# Patient Record
Sex: Female | Born: 1970 | Race: White | Hispanic: No | Marital: Single | Smoking: Former smoker
Health system: Southern US, Community
[De-identification: ages and names within clinical notes are randomized; demographics above are authoritative.]

## PROBLEM LIST (undated history)

## (undated) DIAGNOSIS — E119 Type 2 diabetes mellitus without complications: Secondary | ICD-10-CM

## (undated) DIAGNOSIS — T7840XA Allergy, unspecified, initial encounter: Secondary | ICD-10-CM

## (undated) DIAGNOSIS — E118 Type 2 diabetes mellitus with unspecified complications: Secondary | ICD-10-CM

## (undated) DIAGNOSIS — E785 Hyperlipidemia, unspecified: Secondary | ICD-10-CM

## (undated) DIAGNOSIS — F17200 Nicotine dependence, unspecified, uncomplicated: Secondary | ICD-10-CM

## (undated) DIAGNOSIS — F329 Major depressive disorder, single episode, unspecified: Secondary | ICD-10-CM

## (undated) DIAGNOSIS — F32A Depression, unspecified: Secondary | ICD-10-CM

## (undated) HISTORY — DX: Allergy, unspecified, initial encounter: T78.40XA

## (undated) HISTORY — PX: TONSILLECTOMY AND ADENOIDECTOMY: SUR1326

## (undated) HISTORY — PX: EYE SURGERY: SHX253

## (undated) HISTORY — DX: Major depressive disorder, single episode, unspecified: F32.9

## (undated) HISTORY — DX: Hyperlipidemia, unspecified: E78.5

## (undated) HISTORY — DX: Nicotine dependence, unspecified, uncomplicated: F17.200

## (undated) HISTORY — DX: Type 2 diabetes mellitus without complications: E11.9

## (undated) HISTORY — DX: Depression, unspecified: F32.A

## (undated) HISTORY — DX: Type 2 diabetes mellitus with unspecified complications: E11.8

---

## 1997-08-13 ENCOUNTER — Inpatient Hospital Stay (HOSPITAL_COMMUNITY): Admission: AD | Admit: 1997-08-13 | Discharge: 1997-08-13 | Payer: Self-pay | Admitting: Obstetrics

## 1997-09-04 ENCOUNTER — Encounter: Admission: RE | Admit: 1997-09-04 | Discharge: 1997-09-04 | Payer: Self-pay | Admitting: Obstetrics

## 2003-08-06 ENCOUNTER — Other Ambulatory Visit: Admission: RE | Admit: 2003-08-06 | Discharge: 2003-08-06 | Payer: Self-pay | Admitting: Obstetrics and Gynecology

## 2009-09-29 LAB — HM PAP SMEAR: HM PAP: NORMAL

## 2012-04-19 ENCOUNTER — Ambulatory Visit: Admit: 2012-04-19 | Payer: Self-pay | Admitting: Orthopedic Surgery

## 2012-04-19 SURGERY — REMOVAL, HARDWARE
Anesthesia: General | Site: Hip | Laterality: Left

## 2012-11-22 ENCOUNTER — Ambulatory Visit (INDEPENDENT_AMBULATORY_CARE_PROVIDER_SITE_OTHER): Payer: BC Managed Care – PPO | Admitting: Physician Assistant

## 2012-11-22 ENCOUNTER — Telehealth: Payer: Self-pay | Admitting: Physician Assistant

## 2012-11-22 ENCOUNTER — Encounter: Payer: Self-pay | Admitting: Physician Assistant

## 2012-11-22 VITALS — BP 124/84 | HR 88 | Temp 98.7°F | Resp 18 | Ht 66.0 in | Wt 200.0 lb

## 2012-11-22 DIAGNOSIS — F17200 Nicotine dependence, unspecified, uncomplicated: Secondary | ICD-10-CM | POA: Insufficient documentation

## 2012-11-22 DIAGNOSIS — E119 Type 2 diabetes mellitus without complications: Secondary | ICD-10-CM

## 2012-11-22 LAB — HEMOGLOBIN A1C, FINGERSTICK: Hgb A1C (fingerstick): 9.9 % — ABNORMAL HIGH

## 2012-11-22 MED ORDER — VARENICLINE TARTRATE 0.5 MG PO TABS: 0.5000 mg | ORAL_TABLET | Freq: Two times a day (BID) | ORAL | Status: DC

## 2012-11-22 MED ORDER — VARENICLINE TARTRATE 1 MG PO TABS: 1.0000 mg | ORAL_TABLET | Freq: Two times a day (BID) | ORAL | Status: DC

## 2012-11-22 MED ORDER — INSULIN GLARGINE 100 UNIT/ML SOLOSTAR PEN: 30.0000 [IU] | PEN_INJECTOR | Freq: Every day | SUBCUTANEOUS | Status: DC

## 2012-11-22 NOTE — Progress Notes (Signed)
Patient ID: Mckenzie Tyler MRN: [id number], DOB: 09/07/1970, 42 y.o. Date of Encounter: @DATE @  Chief Complaint:  Chief Complaint  Patient presents with  . Diabetes    HPI: 42 y.o. year old white female  presents for followup.  Her last office visit at our office was August 2011. She states that she actually has had no medical evaluations at all since that appointment. She saw Dr. Lelon Perla for that one visit in August 2011. Prior to that she had been at our office one other time and saw me for complete physical exam June 2010. At that visit she reported that Dr. Kalman Jewels back with checking all of her labs including cholesterol and was managing her diabetes.  Today I asked her what happened was seeing Dr. Betsy Coder. She says that she got frustrated because she felt like they kept adjusting medications for her blood sugar was never getting control. Therefore she just stopped all her medications and followup around 2010.  Says that she was on insulin 70/30 at 30 units twice a day back then.  Has been using  no diabetic treatment since 2010.  I Asked her what prompted her to come in to restart treatment today. She states that her fianc recently had lower extremity amputation secondary to uncontrolled diabetes.  Also, she reports in the past she used to Chantix and it worked very well for her and she was able to quit smoking. She says that she stayed on it for 2 or 3 years but then went back to smoking about 1-1/2 years ago. Currently smoking a little less than one pack per day. She is requesting a refill on Chantix.    Past Medical History  Diagnosis Date  . Diabetes mellitus without complication   . Smoker      Home Meds: See attached medication section for current medication list. Any medications entered into computer today will not appear on this note's list. The medications listed below were entered prior to today. No current outpatient prescriptions on file prior to visit.    No current facility-administered medications on file prior to visit.    Allergies: No Known Allergies  History   Social History  . Marital Status: Married    Spouse Name: N/A    Number of Children: N/A  . Years of Education: N/A   Occupational History  . Not on file.   Social History Main Topics  . Smoking status: Current Every Day Smoker -- 1.00 packs/day  . Smokeless tobacco: Never Used  . Alcohol Use: No  . Drug Use: No  . Sexual Activity: Yes    Birth Control/ Protection: None   Other Topics Concern  . Not on file   Social History Narrative  . No narrative on file    Family History  Problem Relation Age of Onset  . Diabetes Father   . Cancer Neg Hx   . Heart disease Neg Hx      Review of Systems:  See HPI for pertinent ROS. All other ROS negative.    Physical Exam: Blood pressure 124/84, pulse 88, temperature 98.7 F (37.1 C), temperature source Oral, resp. rate 18, height 5\' 6"  (1.676 m), weight 200 lb (90.719 kg), last menstrual period 11/23/2010., Body mass index is 32.3 kg/(m^2). General: Overweight white female .Appears in no acute distress. Neck: Supple. No thyromegaly. No lymphadenopathy. No carotid bruits. Lungs: Clear bilaterally to auscultation without wheezes, rales, or rhonchi. Breathing is unlabored. Heart: RRR with S1 S2. No murmurs, rubs, or  gallops. Abdomen: Soft, non-tender, non-distended with normoactive bowel sounds. No hepatomegaly. No rebound/guarding. No obvious abdominal masses. Musculoskeletal:  Strength and tone normal for age. Extremities/Skin: Warm and dry. No clubbing or cyanosis. No edema. No rashes or suspicious lesions. Neuro: Alert and oriented X 3. Moves all extremities spontaneously. Gait is normal. CNII-XII grossly in tact. Psych:  Responds to questions appropriately with a normal affect.   A1C==9.9 today at OV   ASSESSMENT AND PLAN:  42 y.o. year old female with  1. Diabetes mellitus without complication Check  CMET to document chronic kidney and liver numbers.  If creatinine is normal we'll start metformin as an insulin sensitizer. If creatinine is elevated we'll use a different medication instead. Will go ahead and start Lantus now. She is going to start at 15 units ( I did not write it down, but I think I told her to start at 15?) and go up by 2 units each day until she gets to goal fasting blood sugar around 80-120. I did Give her  2 samples of Lantus pens. I gave her a blood sugar log sheet for her to document blood sugars. She is to schedule a followup office visit in 2 weeks and bring the blood sugar log with her. SHe is to come fasting to that appointment so we can check lipid panel at that time.  I told her that I was surprised that her A1c is 9.9. I was expecting it to be even higher. He states that she has lost about 20 pounds secondary to diet changes. Says in the past she travels a lot and 8 in restaurants and ate a very poor diet. 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 As well since her fianc had the amputation there being very compliant with diet.  - Insulin Glargine (LANTUS SOLOSTAR) 100 UNIT/ML SOPN; Inject 30 Units into the skin at bedtime.  Dispense: 1 pen; Refill: 2 - COMPLETE METABOLIC PANEL WITH GFR - Hemoglobin A1C, fingerstick  2. Smoker He has been informed of potential adverse effects of Chantix including but not limited to change in mood or behavior including suicidal ideation. She has to use this medication in the past with no adverse effects and with good results in smoking cessation. - varenicline (CHANTIX) 0.5 MG tablet; Take 1 tablet (0.5 mg total) by mouth 2 (two) times daily.  Dispense: 60 tablet; Refill: 0 - varenicline (CHANTIX) 1 MG tablet; Take 1 tablet (1 mg total) by mouth 2 (two) times daily.  Dispense: 60 tablet; Refill: 3  Note that she originally had scheduled today's appointment as a complete physical exam. However because she  reported known diabetes that was not being treated we discussed that we would just treat that today. Once we get her diabetes and lipids managed, we will then schedule a complete physical exam.  Signed, Frazier Richards, [place], [facility] 11/22/2012 1:49 PM

## 2012-11-22 NOTE — Telephone Encounter (Signed)
Pharmacy call : Chantix  Please call back in regards to her RX . Unsure about how to dispense.

## 2012-11-23 LAB — COMPLETE METABOLIC PANEL WITH GFR
Albumin: 4.5 g/dL (ref 3.5–5.2)
Alkaline Phosphatase: 61 U/L (ref 39–117)
BUN: 17 mg/dL (ref 6–23)
CO2: 28 mEq/L (ref 19–32)
Calcium: 10.2 mg/dL (ref 8.4–10.5)
Chloride: 99 mEq/L (ref 96–112)
GFR, Est African American: >89 mL/min
GFR, Est Non African American: >89 mL/min
Glucose, Bld: 306 mg/dL — ABNORMAL HIGH (ref 70–99)
Sodium: 137 mEq/L (ref 135–145)
Total Bilirubin: 0.8 mg/dL (ref 0.3–1.2)
Total Protein: 7.2 g/dL (ref 6.0–8.3)

## 2012-11-23 NOTE — Telephone Encounter (Signed)
Pharmacy called and Rx clarified per direction seen in office visit

## 2012-12-06 ENCOUNTER — Encounter: Payer: Self-pay | Admitting: Physician Assistant

## 2012-12-06 ENCOUNTER — Other Ambulatory Visit: Payer: Self-pay | Admitting: Family Medicine

## 2012-12-06 ENCOUNTER — Ambulatory Visit (INDEPENDENT_AMBULATORY_CARE_PROVIDER_SITE_OTHER): Payer: BC Managed Care – PPO | Admitting: Physician Assistant

## 2012-12-06 VITALS — BP 134/86 | HR 76 | Temp 98.2°F | Resp 18 | Wt 202.0 lb

## 2012-12-06 DIAGNOSIS — E119 Type 2 diabetes mellitus without complications: Secondary | ICD-10-CM

## 2012-12-06 DIAGNOSIS — F17200 Nicotine dependence, unspecified, uncomplicated: Secondary | ICD-10-CM

## 2012-12-06 DIAGNOSIS — E785 Hyperlipidemia, unspecified: Secondary | ICD-10-CM

## 2012-12-06 LAB — COMPLETE METABOLIC PANEL WITH GFR
AST: 16 U/L (ref 0–37)
Alkaline Phosphatase: 46 U/L (ref 39–117)
BUN: 14 mg/dL (ref 6–23)
Calcium: 9.1 mg/dL (ref 8.4–10.5)
Creat: 0.66 mg/dL (ref 0.50–1.10)
GFR, Est African American: >89 mL/min
GFR, Est Non African American: >89 mL/min
Glucose, Bld: 258 mg/dL — ABNORMAL HIGH (ref 70–99)
Potassium: 4.7 mEq/L (ref 3.5–5.3)
Total Bilirubin: 0.6 mg/dL (ref 0.3–1.2)

## 2012-12-06 LAB — LIPID PANEL
Cholesterol: 187 mg/dL (ref 0–200)
HDL: 32 mg/dL — ABNORMAL LOW (ref >39)
Total CHOL/HDL Ratio: 5.8 Ratio
Triglycerides: 303 mg/dL — ABNORMAL HIGH (ref <150)
VLDL: 61 mg/dL — ABNORMAL HIGH (ref 0–40)

## 2012-12-06 MED ORDER — METFORMIN HCL 500 MG PO TABS: 500.0000 mg | ORAL_TABLET | Freq: Two times a day (BID) | ORAL | Status: DC

## 2012-12-06 MED ORDER — METFORMIN HCL 1000 MG PO TABS: 1000.0000 mg | ORAL_TABLET | Freq: Two times a day (BID) | ORAL | Status: DC

## 2012-12-06 NOTE — Progress Notes (Signed)
Patient ID: Mckenzie Tyler MRN: [id number], DOB: Jun 10, 1970, 42 y.o. Date of Encounter: @DATE @  Chief Complaint:  Chief Complaint  Patient presents with  . 2 week follow up    elevated BS, does not know about labs or new med, had trouble calling back  . has not started metformin    HPI: 42 y.o. year old female  presents for followup.  She recently saw me on 11/22/12.  Prior to that, her last office visit here in this office was August 2011. She states that she actually has had no medical  eval since that appointment. She saw Dr. Lelon Perla for that one visit August 2011. Prior to that she had been at our office one other time by me for complete physical exam June 2010. At that visit she reported to Dr. Anson Fret was checking all of her labs including cholesterol and was managing her diabetes.  She came to the visit with me 11/22/12 I asked her what happened to seen Dr. Anson Fret. She stated that she had gotten frustrated because she felt like she kept adjusting medications for her blood sugar that was never getting controlled. Therefore she just stopped all her medications and stopped all followup around 2010. Says that back then she was on insulin 70/30 at 30 units twice a day. She had been using no diabetic treatment since 2010.  When asked her what prompted her to come in to restart treatment on 11/22/12. She stated that her fianc recently had LE. amputation secondary to uncontrolled diabetes.  At that visit she also reported that in the past she had used Chantix and it worked very well for her and she was able to quit smoking. She stated that she stayed quit for 2-3 years but then went back to smoking for about 11/2 years ago. At that visit she was smoking a little less than one pack per day and was requesting a refill on Chantix.  The visit 11/22/12 we did an A1c while she was here. A1c was 9.9. I went ahead and started her on Lantus at that time. Plan to check labs and a creatinine  normal would add metformin.  Today she states that she playsed phone tag with [place] regarding getting her lab results. She was never told to start metformin. However she did start the Lantus and has been titrating that as directed. She started at 15 units and has gradually worked up to 28 units. However her sugar really has not changed much at all. On the first day of treatment when she was on 15 units her fasting blood sugar was 264. Every single fasting morning blood sugar has ranged from 245-280 every since then. She is currently up to 28 units but still her fasting sugar this morning was reading 277. She also has readings 40 before lunch and before dinner but these are all similar readings in range 250-300.  As well today she says that she is on the Chantix that that is working very well for her. She is down to about 3 cigarettes per day.   Past Medical History  Diagnosis Date  . Diabetes mellitus without complication   . Smoker      Home Meds: See attached medication section for current medication list. Any medications entered into computer today will not appear on this note's list. The medications listed below were entered prior to today. Current Outpatient Prescriptions on File Prior to Visit  Medication Sig Dispense Refill  . Insulin Glargine (LANTUS SOLOSTAR) 100 UNIT/ML  SOPN Inject 30 Units into the skin at bedtime.  1 pen  2  . varenicline (CHANTIX) 0.5 MG tablet Take 1 tablet (0.5 mg total) by mouth 2 (two) times daily.  60 tablet  0  . varenicline (CHANTIX) 1 MG tablet Take 1 tablet (1 mg total) by mouth 2 (two) times daily.  60 tablet  3   No current facility-administered medications on file prior to visit.    Allergies: No Known Allergies  History   Social History  . Marital Status: Married    Spouse Name: N/A    Number of Children: N/A  . Years of Education: N/A   Occupational History  . Not on file.   Social History Main Topics  . Smoking status: Current Every Day  Smoker -- 1.00 packs/day  . Smokeless tobacco: Never Used  . Alcohol Use: No  . Drug Use: No  . Sexual Activity: Yes    Birth Control/ Protection: None   Other Topics Concern  . Not on file   Social History Narrative  . No narrative on file    Family History  Problem Relation Age of Onset  . Diabetes Father   . Cancer Neg Hx   . Heart disease Neg Hx      Review of Systems:  See HPI for pertinent ROS. All other ROS negative.    Physical Exam: Blood pressure 134/86, pulse 76, temperature 98.2 F (36.8 C), temperature source Oral, resp. rate 18, weight 202 lb (91.627 kg), last menstrual period 11/23/2010., Body mass index is 32.62 kg/(m^2). General: Obese white female. Appears in no acute distress.  Neck: Supple. No thyromegaly. No lymphadenopathy. Lungs: Clear bilaterally to auscultation without wheezes, rales, or rhonchi. Breathing is unlabored. Heart: RRR with S1 S2. No murmurs, rubs, or gallops. Abdomen: Soft, non-tender, non-distended with normoactive bowel sounds. No hepatomegaly. No rebound/guarding. No obvious abdominal masses. Musculoskeletal:  Strength and tone normal for age. Extremities/Skin: Warm and dry. No clubbing or cyanosis. No edema. No rashes or suspicious lesions. Neuro: Alert and oriented X 3. Moves all extremities spontaneously. Gait is normal. CNII-XII grossly in tact. Psych:  Responds to questions appropriately with a normal affect.     ASSESSMENT AND PLAN:  42 y.o. year old female with  1. Diabetes mellitus without complication She will add metformin. We'll start at 500 mg dose to see if she has any diarrhea or GI adverse effects. As well then she can monitor her blood sugar make sure that it is not dropping too low. She tolerates this dose then she will increase up to 1000 mg twice a day. Now she will continue her Lantus at the current dose and we'll not continue to increase for at least one week until she gets a metformin added on. Once metformin  was added and she can titrate the Lantus as needed. - metFORMIN (GLUCOPHAGE) 500 MG tablet; Take 1 tablet (500 mg total) by mouth 2 (two) times daily with a meal.  Dispense: 60 tablet; Refill: 0 - metFORMIN (GLUCOPHAGE) 1000 MG tablet; Take 1 tablet (1,000 mg total) by mouth 2 (two) times daily with a meal.  Dispense: 60 tablet; Refill: 3 - COMPLETE METABOLIC PANEL WITH GFR - Lipid panel  2. Smoker Cont. Chantix.  3. ? Lipid Status: Check FLP now. Recheck CMET.  Once we get these medical problems stable and controlled then we will need to schedule a complete physical exam to get all of her medical care  updated. Signed, Shon Hale Lincoln, [place],  [facility] 12/06/2012 9:25 AM

## 2012-12-13 MED ORDER — SIMVASTATIN 10 MG PO TABS: 10.0000 mg | ORAL_TABLET | Freq: Every day | ORAL | Status: DC

## 2012-12-13 NOTE — Addendum Note (Signed)
Addended by: Elvina Mattes T on: 12/13/2012 11:09 AM   Modules accepted: Orders

## 2012-12-31 ENCOUNTER — Ambulatory Visit: Payer: BC Managed Care – PPO | Admitting: Physician Assistant

## 2013-02-13 ENCOUNTER — Encounter: Payer: Self-pay | Admitting: Physician Assistant

## 2013-02-13 ENCOUNTER — Ambulatory Visit (INDEPENDENT_AMBULATORY_CARE_PROVIDER_SITE_OTHER): Payer: BC Managed Care – PPO | Admitting: Physician Assistant

## 2013-02-13 VITALS — BP 122/82 | HR 84 | Temp 97.8°F | Resp 18 | Wt 203.0 lb

## 2013-02-13 DIAGNOSIS — E119 Type 2 diabetes mellitus without complications: Secondary | ICD-10-CM

## 2013-02-13 DIAGNOSIS — E785 Hyperlipidemia, unspecified: Secondary | ICD-10-CM

## 2013-02-13 DIAGNOSIS — F17200 Nicotine dependence, unspecified, uncomplicated: Secondary | ICD-10-CM

## 2013-02-13 HISTORY — DX: Hyperlipidemia, unspecified: E78.5

## 2013-02-13 MED ORDER — SIMVASTATIN 10 MG PO TABS: 10.0000 mg | ORAL_TABLET | Freq: Every day | ORAL | Status: DC

## 2013-02-13 MED ORDER — METFORMIN HCL 1000 MG PO TABS: 1000.0000 mg | ORAL_TABLET | Freq: Two times a day (BID) | ORAL | Status: DC

## 2013-02-13 MED ORDER — INSULIN GLARGINE 100 UNIT/ML SOLOSTAR PEN: 60.0000 [IU] | PEN_INJECTOR | Freq: Every day | SUBCUTANEOUS | Status: DC

## 2013-02-13 MED ORDER — CANAGLIFLOZIN 300 MG PO TABS: 1.0000 | ORAL_TABLET | Freq: Every day | ORAL | Status: DC

## 2013-02-14 LAB — COMPLETE METABOLIC PANEL WITH GFR
ALBUMIN: 4.3 g/dL (ref 3.5–5.2)
ALT: 39 U/L — AB (ref 0–35)
AST: 22 U/L (ref 0–37)
Alkaline Phosphatase: 52 U/L (ref 39–117)
BUN: 14 mg/dL (ref 6–23)
CALCIUM: 10 mg/dL (ref 8.4–10.5)
CHLORIDE: 102 meq/L (ref 96–112)
CO2: 26 mEq/L (ref 19–32)
Creat: 0.65 mg/dL (ref 0.50–1.10)
GFR, Est Non African American: >89 mL/min
Glucose, Bld: 310 mg/dL — ABNORMAL HIGH (ref 70–99)
POTASSIUM: 4.4 meq/L (ref 3.5–5.3)
SODIUM: 140 meq/L (ref 135–145)
TOTAL PROTEIN: 6.8 g/dL (ref 6.0–8.3)
Total Bilirubin: 0.5 mg/dL (ref 0.3–1.2)

## 2013-02-14 LAB — HEMOGLOBIN A1C
Hgb A1c MFr Bld: 10.2 % — ABNORMAL HIGH (ref <5.7)
Mean Plasma Glucose: 246 mg/dL — ABNORMAL HIGH (ref <117)

## 2013-02-16 NOTE — Progress Notes (Signed)
Patient ID: Mckenzie Tyler MRN: [id number], DOB: Jun 26, 1970, 43 y.o. Date of Encounter: @DATE @  Chief Complaint:  Chief Complaint  Patient presents with  . diabetic check up    HPI: 43 y.o. year old female  presents for routine followup.  She saw me for an office visit 11/22/12. Prior to that visit her last visit with our office had been August 2011. She had also reported in the past that she was seeing Dr. Isabel Caprice for her diabetes. She came to see me 11/22/12 asked her about this. She stated that she had gotten frustrated because she felt like she kept adjusting medications for her blood sugar but they were never getting control. Therefore she ended up stopping all of her medications and stopped all followup around 2010. She reported that back then she was on insulin 70/30 at 30 units twice a day. She had been using no diabetic treatment since 2010. Asked what prompted her to come in to restart treatment 11/22/12. She stated that her fianc recently had dorsum of the amputation secondary to uncontrolled diabetes.  At visit 11/22/12 A1c was 9.9. Started Lantus at that time. Plan to check labs and creatinine normal at metformin.  She had followup with me 12/06/12. Metformin was added. She is then to continue to titrate her Lantus.  Today she reports that she was able to titrate the metformin up to 1000 mg twice a day. She is having no adverse effects and is tolerating this well. She has titrate the Lantus up to 40 units each bedtime. Fasting am sugars are still 200 to 300s.  At last visit we checked FLP LFT. Recommended starting simvastatin 10 mg. She states that she is taking this as directed. She is having no myalgias or other adverse effects.   Also at her visit 11/25/2012 she reported that she did use Chantix in the past and it worked very well for her. She has stayed clear for 2-3 years with an echo back to smoking about 1-1/2 years ago. At that visit I gave her another prescription  of Chantix. Today she states that she finished the Chantix 2 weeks ago and has not smoked any for 71 days. Says that she has no cravings to smoke. Says she's even been around smokers at times and has had no cravings.    Past Medical History  Diagnosis Date  . Diabetes mellitus without complication   . Smoker   . Hyperlipidemia 02/13/2013     Home Meds: See attached medication section for current medication list. Any medications entered into computer today will not appear on this note's list. The medications listed below were entered prior to today. No current outpatient prescriptions on file prior to visit.   No current facility-administered medications on file prior to visit.    Allergies: No Known Allergies  History   Social History  . Marital Status: Married    Spouse Name: N/A    Number of Children: N/A  . Years of Education: N/A   Occupational History  . Not on file.   Social History Main Topics  . Smoking status: Current Every Day Smoker -- 1.00 packs/day  . Smokeless tobacco: Never Used  . Alcohol Use: No  . Drug Use: No  . Sexual Activity: Yes    Birth Control/ Protection: None   Other Topics Concern  . Not on file   Social History Narrative  . No narrative on file    Family History  Problem Relation Age of Onset  .  Diabetes Father   . Cancer Neg Hx   . Heart disease Neg Hx      Review of Systems:  See HPI for pertinent ROS. All other ROS negative.    Physical Exam: Blood pressure 122/82, pulse 84, temperature 97.8 F (36.6 C), temperature source Oral, resp. rate 18, weight 203 lb (92.08 kg), last menstrual period 11/23/2010., Body mass index is 32.78 kg/(m^2). General: Obese WF. Appears in no acute distress. Neck: Supple. No thyromegaly. No lymphadenopathy. No carotid bruits. Lungs: Clear bilaterally to auscultation without wheezes, rales, or rhonchi. Breathing is unlabored. Heart: RRR with S1 S2. No murmurs, rubs, or gallops. Abdomen: Soft,  non-tender, non-distended with normoactive bowel sounds. No hepatomegaly. No rebound/guarding. No obvious abdominal masses. Musculoskeletal:  Strength and tone normal for age. Extremities/Skin: Warm and dry. No clubbing or cyanosis. No edema. No rashes or suspicious lesions. Neuro: Alert and oriented X 3. Moves all extremities spontaneously. Gait is normal. CNII-XII grossly in tact. Psych:  Responds to questions appropriately with a normal affect.     ASSESSMENT AND PLAN:  43 y.o. year old female with  1. Diabetes mellitus without complication - COMPLETE METABOLIC PANEL WITH GFR - Hemoglobin A1c - Canagliflozin 300 MG TABS; Take 1 tablet (300 mg total) by mouth daily.  Dispense: 30 tablet; Refill: 5 - Insulin Glargine (LANTUS SOLOSTAR) 100 UNIT/ML Solostar Pen; Inject 60 Units into the skin daily at 10 pm.  Dispense: 15 mL; Refill: 11 - metFORMIN (GLUCOPHAGE) 1000 MG tablet; Take 1 tablet (1,000 mg total) by mouth 2 (two) times daily with a meal.  Dispense: 60 tablet; Refill: 5  We'll add Invokana 300 mg daily. Continue metformin 1000 twice a day. After she has been on Invokana for several weeks and sugars stabilize from this, then she can continue to titrate up the Lantus.  She is on statin. Currently on no ACE inhibitor. We'll add this at the next visit. We'll not add it now given that she is starting Imdur,. Do not want to cause lightheadedness et Ronney Asters.  At next visit I will do diabetic foot exam. We'll also do microalbumin and give Pneumovax. She is scheduled to have an eye exam soon. At her next visit she is to bring her blood sugar log with her first review.   2. Hyperlipidemia She did start Zocor 10 mg at the last check. She is nonfasting today so cannot check the lipid panel but will check LFTs. - COMPLETE METABOLIC PANEL WITH GFR - simvastatin (ZOCOR) 10 MG tablet; Take 1 tablet (10 mg total) by mouth at bedtime.  Dispense: 30 tablet; Refill: 5  3. Smoker Congrats the  cessation. I told her she begins to fill it she is going to start back smoking to call me and we can prescribe further medication.  Once we get these medical problems stable and controlled then we will need to schedule a complete physical exam to get all her medical care updated.   Marin Olp Jackson, [place], [facility] 02/16/2013 7:19 AM

## 2013-02-18 ENCOUNTER — Telehealth: Payer: Self-pay | Admitting: Family Medicine

## 2013-02-18 NOTE — Telephone Encounter (Signed)
Message copied by Olena Mater on Mon Feb 18, 2013  4:12 PM ------      Message from: Dena Billet      Created: Fri Feb 15, 2013  1:26 PM       Tell patient to add the Invokana, and then titrate up her Lantus as we discussed at the office visit yesterday. Make sure to followup within 3 months so we can get this under control. ------

## 2013-02-25 ENCOUNTER — Encounter: Payer: Self-pay | Admitting: Family Medicine

## 2013-02-26 ENCOUNTER — Encounter (INDEPENDENT_AMBULATORY_CARE_PROVIDER_SITE_OTHER): Payer: BC Managed Care – PPO | Admitting: Ophthalmology

## 2013-03-07 ENCOUNTER — Ambulatory Visit: Payer: BC Managed Care – PPO | Admitting: Physician Assistant

## 2013-03-08 ENCOUNTER — Encounter: Payer: Self-pay | Admitting: Family Medicine

## 2013-03-08 NOTE — Telephone Encounter (Signed)
Pt finally called me back.  Has been taking the Invokana.  She states it was not holding her sugars well so she cuts it in half and take half BID???  Is titrating her insulin and she states sugars are running between 92-137 in AM.  Has made 3 mth appt (4/23)

## 2013-05-01 ENCOUNTER — Telehealth: Payer: Self-pay | Admitting: Family Medicine

## 2013-05-01 DIAGNOSIS — E119 Type 2 diabetes mellitus without complications: Secondary | ICD-10-CM

## 2013-05-01 MED ORDER — METFORMIN HCL 1000 MG PO TABS: 1000.0000 mg | ORAL_TABLET | Freq: Two times a day (BID) | ORAL | Status: DC

## 2013-05-01 NOTE — Telephone Encounter (Signed)
Medication refilled per protocol. 

## 2013-05-15 ENCOUNTER — Telehealth: Payer: Self-pay | Admitting: Family Medicine

## 2013-05-15 DIAGNOSIS — E119 Type 2 diabetes mellitus without complications: Secondary | ICD-10-CM

## 2013-05-15 MED ORDER — INSULIN GLARGINE 100 UNIT/ML SOLOSTAR PEN: 100.0000 [IU] | PEN_INJECTOR | Freq: Every day | SUBCUTANEOUS | Status: DC

## 2013-05-15 NOTE — Telephone Encounter (Signed)
Message copied by Olena Mater on Wed May 15, 2013  2:36 PM ------      Message from: Devoria Glassing      Created: Wed May 15, 2013  2:21 PM       Patient is calling to say that her rx for her insulin was for 60 units per day and needs to be rewritten for 100 units per day to [facility] on [address]  ------

## 2013-05-15 NOTE — Telephone Encounter (Signed)
I called patient.  She states she has been titrating insulin and is now up to 100 units/day.  Has follow up appt next week.  Sent in one refill.  Told not to miss appt next week and come fasting.

## 2013-05-20 ENCOUNTER — Telehealth: Payer: Self-pay | Admitting: Physician Assistant

## 2013-05-20 ENCOUNTER — Telehealth: Payer: Self-pay | Admitting: Family Medicine

## 2013-05-20 DIAGNOSIS — E119 Type 2 diabetes mellitus without complications: Secondary | ICD-10-CM

## 2013-05-20 MED ORDER — INSULIN GLARGINE 100 UNIT/ML SOLOSTAR PEN: 100.0000 [IU] | PEN_INJECTOR | Freq: Every day | SUBCUTANEOUS | Status: DC

## 2013-05-20 NOTE — Telephone Encounter (Signed)
Needed full month supply for it to be covered.  Refill resent.  Pt aware to still keep appt Thursday.

## 2013-05-20 NOTE — Telephone Encounter (Signed)
Message copied by Olena Mater on Mon May 20, 2013 12:45 PM ------      Message from: Devoria Glassing      Created: Mon May 20, 2013 11:49 AM       Patient left message saying that insurance will not cover her rx for insulin and she is out please call her back at [phone] ------

## 2013-05-23 ENCOUNTER — Encounter: Payer: Self-pay | Admitting: Physician Assistant

## 2013-05-23 ENCOUNTER — Ambulatory Visit (INDEPENDENT_AMBULATORY_CARE_PROVIDER_SITE_OTHER): Payer: BC Managed Care – PPO | Admitting: Physician Assistant

## 2013-05-23 ENCOUNTER — Encounter: Payer: Self-pay | Admitting: Family Medicine

## 2013-05-23 VITALS — BP 114/76 | HR 80 | Temp 97.9°F | Resp 18 | Ht 65.25 in | Wt 205.0 lb

## 2013-05-23 DIAGNOSIS — E119 Type 2 diabetes mellitus without complications: Secondary | ICD-10-CM

## 2013-05-23 DIAGNOSIS — F3289 Other specified depressive episodes: Secondary | ICD-10-CM

## 2013-05-23 DIAGNOSIS — E785 Hyperlipidemia, unspecified: Secondary | ICD-10-CM

## 2013-05-23 DIAGNOSIS — F329 Major depressive disorder, single episode, unspecified: Secondary | ICD-10-CM | POA: Insufficient documentation

## 2013-05-23 DIAGNOSIS — Z23 Encounter for immunization: Secondary | ICD-10-CM

## 2013-05-23 DIAGNOSIS — F17200 Nicotine dependence, unspecified, uncomplicated: Secondary | ICD-10-CM

## 2013-05-23 DIAGNOSIS — F32A Depression, unspecified: Secondary | ICD-10-CM | POA: Insufficient documentation

## 2013-05-23 LAB — LIPID PANEL
CHOL/HDL RATIO: 6.3 ratio
Cholesterol: 190 mg/dL (ref 0–200)
HDL: 30 mg/dL — ABNORMAL LOW (ref >39)
LDL Cholesterol: 111 mg/dL — ABNORMAL HIGH (ref 0–99)
Triglycerides: 243 mg/dL — ABNORMAL HIGH (ref <150)
VLDL: 49 mg/dL — AB (ref 0–40)

## 2013-05-23 LAB — COMPLETE METABOLIC PANEL WITH GFR
ALT: 24 U/L (ref 0–35)
AST: 16 U/L (ref 0–37)
Albumin: 4.4 g/dL (ref 3.5–5.2)
Alkaline Phosphatase: 46 U/L (ref 39–117)
BUN: 13 mg/dL (ref 6–23)
CALCIUM: 9.3 mg/dL (ref 8.4–10.5)
CHLORIDE: 104 meq/L (ref 96–112)
CO2: 25 mEq/L (ref 19–32)
Creat: 0.66 mg/dL (ref 0.50–1.10)
GFR, Est Non African American: >89 mL/min
Glucose, Bld: 181 mg/dL — ABNORMAL HIGH (ref 70–99)
Potassium: 4.1 mEq/L (ref 3.5–5.3)
SODIUM: 139 meq/L (ref 135–145)
TOTAL PROTEIN: 6.7 g/dL (ref 6.0–8.3)
Total Bilirubin: 0.6 mg/dL (ref 0.2–1.2)

## 2013-05-23 LAB — HEMOGLOBIN A1C
Hgb A1c MFr Bld: 7.8 % — ABNORMAL HIGH (ref <5.7)
Mean Plasma Glucose: 177 mg/dL — ABNORMAL HIGH (ref <117)

## 2013-05-23 LAB — MICROALBUMIN, URINE: Microalb, Ur: 0.78 mg/dL (ref 0.00–1.89)

## 2013-05-23 MED ORDER — RAMIPRIL 2.5 MG PO CAPS: 2.5000 mg | ORAL_CAPSULE | Freq: Every day | ORAL | Status: DC

## 2013-05-23 MED ORDER — ASPIRIN 81 MG PO TABS: 81.0000 mg | ORAL_TABLET | Freq: Every day | ORAL | Status: DC

## 2013-05-23 MED ORDER — INSULIN GLARGINE 100 UNIT/ML SOLOSTAR PEN: 100.0000 [IU] | PEN_INJECTOR | Freq: Every day | SUBCUTANEOUS | Status: DC

## 2013-05-23 NOTE — Progress Notes (Signed)
Patient ID: Mckenzie Tyler MRN: [id number], DOB: 1970-05-18, 43 y.o. Date of Encounter: @DATE @  Chief Complaint:  Chief Complaint  Patient presents with  . 3 mth check up    is fasting    HPI: 43 y.o. year old female  presents for routine followup.  She saw me for an office visit 11/22/12. Prior to that visit her last visit with our office had been August 2011. She had also reported in the past that she was seeing Dr. Isabel Caprice for her diabetes. She came to see me 11/22/12 asked her about this. She stated that she had gotten frustrated because she felt like she kept adjusting medications for her blood sugar but they were never getting control. Therefore she ended up stopping all of her medications and stopped all followup around 2010. She reported that back then she was on insulin 70/30 at 30 units twice a day. She had been using no diabetic treatment since 2010. Asked what prompted her to come in to restart treatment 11/22/12. She stated that her fianc recently had dorsum of the amputation secondary to uncontrolled diabetes.  At visit 11/22/12 A1c was 45.9. Started Lantus at that time. Plan to check labs and creatinine normal at metformin.  She had followup with me 12/06/12. Metformin was added. She is then to continue to titrate her Lantus.  At [place] 01/2013 she reported that she was able to titrate the metformin up to 1000 mg twice a day. She was having no adverse effects and was tolerating this well.  At last office visit January 2015 we added Invokana. She said that this made a huge difference in her blood sugars. Says it definitely decreasing quite a bit. After she added that she started to titrate back up her insulin. She brought in her log sheet today. Lantus is now at about 90 units. Fasting blood sugars are very consistent and are between 92-122. Blood sugars are extremely consistent and are excellent  !!!  At  Visit 10/14 we checked FLP LFT. Recommended starting simvastatin  10 mg. She states that she is taking this as directed. She is having no myalgias or other adverse effects.   Also at her visit 11/25/2012 she reported that she did use Chantix in the past and it worked very well for her. She has stayed clear for 2-3 years with an echo back to smoking about 1-1/2 years ago. At that visit I gave her another prescription of Chantix. At [place] 45/15 she stated that she finished the Chantix 2 weeks ago and has not smoked any for 71 days. Says that she has no cravings to smoke. Says she's even been around smokers at times and has had no cravings. Today she reports she has continued to stay off of the cigarettes and still is having absolutely no cravings. !!!!  She does no exercise. SHe tries to be compliant with her diet. Says She actually has gotten to where she would prefer to eat an apple or carrots for snacks. She works at [employer] and has to work 12 hour shifts 3 days a week. Says that she tried joining a gym for [employer] resolution but was unable to stick with that.    Past Medical History  Diagnosis Date  . Diabetes mellitus without complication   . Smoker   . Hyperlipidemia 02/13/2013  . Depression      Home Meds: See attached medication section for current medication list. Any medications entered into computer today will not appear on  this note's list. The medications listed below were entered prior to today. Current Outpatient Prescriptions on File Prior to Visit  Medication Sig Dispense Refill  . Canagliflozin 300 MG TABS Take 1 tablet (300 mg total) by mouth daily.  30 tablet  5  . Insulin Glargine (LANTUS SOLOSTAR) 100 UNIT/ML Solostar Pen Inject 100 Units into the skin daily at 10 pm.  30 mL  0  . metFORMIN (GLUCOPHAGE) 1000 MG tablet Take 1 tablet (1,000 mg total) by mouth 2 (two) times daily with a meal.  60 tablet  2  . simvastatin (ZOCOR) 10 MG tablet Take 1 tablet (10 mg total) by mouth at bedtime.  30 tablet  5   No current  facility-administered medications on file prior to visit.    Allergies: No Known Allergies  History   Social History  . Marital Status: Married    Spouse Name: N/A    Number of Children: N/A  . Years of Education: N/A   Occupational History  . Not on file.   Social History Main Topics  . Smoking status: Former Smoker -- 1.00 packs/day    Quit date: 12/01/2012  . Smokeless tobacco: Never Used  . Alcohol Use: No  . Drug Use: No  . Sexual Activity: Yes    Birth Control/ Protection: None   Other Topics Concern  . Not on file   Social History Narrative  . No narrative on file    Family History  Problem Relation Age of Onset  . Diabetes Father   . Cancer Neg Hx   . Heart disease Neg Hx      Review of Systems:  See HPI for pertinent ROS. All other ROS negative.    Physical Exam: Blood pressure 114/76, pulse 80, temperature 97.9 F (36.6 C), temperature source Oral, resp. rate 18, height 5' 5.25" (1.657 m), weight 205 lb (92.987 kg), last menstrual period 04/25/2013., Body mass index is 33.87 kg/(m^2). General: Obese WF. Appears in no acute distress. Neck: Supple. No thyromegaly. No lymphadenopathy. No carotid bruits. Lungs: Clear bilaterally to auscultation without wheezes, rales, or rhonchi. Breathing is unlabored. Heart: RRR with S1 S2. No murmurs, rubs, or gallops. Abdomen: Soft, non-tender, non-distended with normoactive bowel sounds. No hepatomegaly. No rebound/guarding. No obvious abdominal masses. Musculoskeletal:  Strength and tone normal for age. Extremities/Skin: Warm and dry. No clubbing or cyanosis. No edema. No rashes or suspicious lesions. Neuro: Alert and oriented X 3. Moves all extremities spontaneously. Gait is normal. CNII-XII grossly in tact. Psych:  Responds to questions appropriately with a normal affect.     ASSESSMENT AND PLAN:  43 y.o. year old female with  1. Diabetes mellitus without complication - COMPLETE METABOLIC PANEL WITH GFR -  Hemoglobin A1c MicroAlbumin  She is on statin. We'll add low dose ACE inhibitor now. Blood pressure is fairly low so will have to use low dose. Add Altace 2.5 mg daily. Also add aspirin 81 mg daily.  Given her diabetes she needs to have a Pneumovax 23. She has never had this. She is agreeable to receive this today.  SHe reports that she did have a diabetic eye exam January 2015. She is aware of proper foot care. We'll need to repeat diabetic foot exam and next visit.  2. Hyperlipidemia She is on simvastatin. She is fasting for recheck FLP LFT.  3. Smoker Congrats the cessation. I told her she begins to fill it she is going to start back smoking to call me and we can  prescribe further medication.  She is agreeable to schedule a complete physical exam. We'll perform breast exam pelvic exam, Pap and up date all preventive care at that visit. Best today that she will need a tetanus vaccine but she wanted to just do the pneumonia today and hold off on it no tenderness at the next visit.   For Orders  placed today see the following:  1. Diabetes mellitus without complication - COMPLETE METABOLIC PANEL WITH GFR - Hemoglobin A1c - ramipril (ALTACE) 2.5 MG capsule; Take 1 capsule (2.5 mg total) by mouth daily.  Dispense: 30 capsule; Refill: 5 - aspirin 81 MG tablet; Take 1 tablet (81 mg total) by mouth daily.  Dispense: 30 tablet; Refill: 11 - Microalbumin, urine - Insulin Glargine (LANTUS SOLOSTAR) 100 UNIT/ML Solostar Pen; Inject 100 Units into the skin daily at 10 pm.  Dispense: 30 mL; Refill: 5  2. Hyperlipidemia - COMPLETE METABOLIC PANEL WITH GFR - Lipid panel  3. Smoker  4. Depression  5. Need for prophylactic vaccination against Streptococcus pneumoniae (pneumococcus) - Pneumococcal polysaccharide vaccine 23-valent greater than or equal to 2yo subcutaneous/IM  Signed, [facility] [place], [place], [facility] 05/23/2013 8:46 AM

## 2013-05-27 ENCOUNTER — Telehealth: Payer: Self-pay | Admitting: Family Medicine

## 2013-05-27 DIAGNOSIS — E785 Hyperlipidemia, unspecified: Secondary | ICD-10-CM

## 2013-05-27 MED ORDER — SIMVASTATIN 20 MG PO TABS: 20.0000 mg | ORAL_TABLET | Freq: Every day | ORAL | Status: DC

## 2013-05-27 NOTE — Telephone Encounter (Signed)
Pt called back.  Made aware of lab results and change to statin medication.  Has appt next month for physical.

## 2013-05-27 NOTE — Telephone Encounter (Signed)
Have left pt message to call back. New Rx to pharmacy

## 2013-05-27 NOTE — Telephone Encounter (Signed)
Message copied by Olena Mater on Mon May 27, 2013  2:19 PM ------      Message from: Dena Billet      Created: Mon May 27, 2013  7:48 AM       Increase simvastatin to 20 mg.      Send prescription for simvastatin 20 mg 1 by mouth each bedtime #30 with 2 refills.      Tell patient to come fasting for her next office visit.      A1c is much improved. She has Insulin-- she can adjust that dose if needed to keep her BS controlled. ------

## 2013-06-17 ENCOUNTER — Encounter: Payer: BC Managed Care – PPO | Admitting: Physician Assistant

## 2013-08-19 ENCOUNTER — Other Ambulatory Visit: Payer: Self-pay | Admitting: Physician Assistant

## 2013-08-19 NOTE — Telephone Encounter (Signed)
Refill appropriate and filled per protocol. 

## 2013-09-30 ENCOUNTER — Other Ambulatory Visit: Payer: Self-pay | Admitting: Physician Assistant

## 2013-09-30 NOTE — Telephone Encounter (Signed)
Refill appropriate and filled per protocol. 

## 2013-11-27 ENCOUNTER — Encounter: Payer: Self-pay | Admitting: Family Medicine

## 2013-11-27 ENCOUNTER — Other Ambulatory Visit: Payer: Self-pay | Admitting: Physician Assistant

## 2013-11-27 NOTE — Telephone Encounter (Signed)
Medication refill for one time only.  Patient needs to be seen.  Letter sent for patient to call and schedule 

## 2013-12-03 ENCOUNTER — Other Ambulatory Visit: Payer: Self-pay | Admitting: Physician Assistant

## 2013-12-04 NOTE — Telephone Encounter (Signed)
Patient has diabetes which requires insulin. She has had no office visit or labs to monitor this since 05/2013. Tell her that she REALLY needs to schedule an office visit--- and come fasting to that appt.   She has used Chantix with success in the past and with no adverse effects. Therefore I'm fine with going ahead and sending in order for another round of Chantix. Send order for starting dose pack #1 with 0 refills and continuing Dosepack with 5 refills.

## 2013-12-04 NOTE — Telephone Encounter (Signed)
Last OV 05/23/13.  Discussed smoking then and making appt for CPE.  Please advise?

## 2013-12-09 NOTE — Telephone Encounter (Signed)
Have left patient 2 messages to call back and sent her a letter. She has not responded.  Rx for chantix returned to pharmacy DENIED.

## 2013-12-18 ENCOUNTER — Ambulatory Visit (INDEPENDENT_AMBULATORY_CARE_PROVIDER_SITE_OTHER): Payer: BC Managed Care – PPO | Admitting: Physician Assistant

## 2013-12-18 ENCOUNTER — Encounter: Payer: Self-pay | Admitting: Physician Assistant

## 2013-12-18 VITALS — BP 104/66 | HR 92 | Temp 98.2°F | Resp 18 | Wt 208.0 lb

## 2013-12-18 DIAGNOSIS — F329 Major depressive disorder, single episode, unspecified: Secondary | ICD-10-CM

## 2013-12-18 DIAGNOSIS — E119 Type 2 diabetes mellitus without complications: Secondary | ICD-10-CM

## 2013-12-18 DIAGNOSIS — Z720 Tobacco use: Secondary | ICD-10-CM

## 2013-12-18 DIAGNOSIS — E785 Hyperlipidemia, unspecified: Secondary | ICD-10-CM

## 2013-12-18 DIAGNOSIS — F17200 Nicotine dependence, unspecified, uncomplicated: Secondary | ICD-10-CM

## 2013-12-18 DIAGNOSIS — F32A Depression, unspecified: Secondary | ICD-10-CM

## 2013-12-18 LAB — COMPLETE METABOLIC PANEL WITH GFR
ALT: 53 U/L — ABNORMAL HIGH (ref 0–35)
AST: 40 U/L — AB (ref 0–37)
Albumin: 4.4 g/dL (ref 3.5–5.2)
Alkaline Phosphatase: 36 U/L — ABNORMAL LOW (ref 39–117)
BUN: 20 mg/dL (ref 6–23)
CO2: 25 mEq/L (ref 19–32)
CREATININE: 0.71 mg/dL (ref 0.50–1.10)
Calcium: 9.5 mg/dL (ref 8.4–10.5)
Chloride: 105 mEq/L (ref 96–112)
GFR, Est African American: >89 mL/min
GFR, Est Non African American: >89 mL/min
Glucose, Bld: 119 mg/dL — ABNORMAL HIGH (ref 70–99)
POTASSIUM: 4.3 meq/L (ref 3.5–5.3)
Sodium: 141 mEq/L (ref 135–145)
Total Bilirubin: 0.5 mg/dL (ref 0.2–1.2)
Total Protein: 6.9 g/dL (ref 6.0–8.3)

## 2013-12-18 LAB — LIPID PANEL
CHOL/HDL RATIO: 4.1 ratio
CHOLESTEROL: 141 mg/dL (ref 0–200)
HDL: 34 mg/dL — ABNORMAL LOW (ref >39)
LDL CALC: 68 mg/dL (ref 0–99)
TRIGLYCERIDES: 196 mg/dL — AB (ref <150)
VLDL: 39 mg/dL (ref 0–40)

## 2013-12-18 LAB — HEMOGLOBIN A1C
Hgb A1c MFr Bld: 6.4 % — ABNORMAL HIGH (ref <5.7)
Mean Plasma Glucose: 137 mg/dL — ABNORMAL HIGH (ref <117)

## 2013-12-18 MED ORDER — VARENICLINE TARTRATE 0.5 MG PO TABS: 0.5000 mg | ORAL_TABLET | Freq: Two times a day (BID) | ORAL | Status: DC

## 2013-12-18 MED ORDER — VARENICLINE TARTRATE 1 MG PO TABS: 1.0000 mg | ORAL_TABLET | Freq: Two times a day (BID) | ORAL | Status: DC

## 2013-12-18 NOTE — Progress Notes (Signed)
Patient ID: Athene Schuhmacher MRN: [id number], DOB: [birth year], 43 y.o. Date of Encounter: @DATE @  Chief Complaint:  Chief Complaint  Patient presents with  . routine check up    is fasting, wants to discuss chantix    HPI: 43 y.o. year old female  presents for routine followup.  She saw me for an office visit 11/22/12. Prior to that visit her last visit with our office had been August 2011. She had also reported in the past that she was seeing Dr. Isabel Caprice for her diabetes. She came to see me 11/22/12 asked her about this. She stated that she had gotten frustrated because she felt like she kept adjusting medications for her blood sugar but they were never getting control. Therefore she ended up stopping all of her medications and stopped all followup around 2010. She reported that back then she was on insulin 70/30 at 30 units twice a day. She had been using no diabetic treatment since 2010. Asked what prompted her to come in to restart treatment 11/22/12. She stated that her fianc recently had dorsum of the amputation secondary to uncontrolled diabetes.  At visit 11/22/12 A1c was 9.9. Started Lantus at that time. Plan to check labs and creatinine normal at metformin.  She had followup with me 12/06/12. Metformin was added. She is then to continue to titrate her Lantus.  At [place] 01/2013 she reported that she was able to titrate the metformin up to 1000 mg twice a day. She was having no adverse effects and was tolerating this well.  At last office visit January 2015 we added Invokana. She said that this made a huge difference in her blood sugars. Says it definitely decreasing quite a bit. After she added that she started to titrate back up her insulin. She brought in her log sheet today. Lantus is now at about 90 units. Fasting blood sugars are very consistent and are between 92-122. Blood sugars are extremely consistent and are excellent  !!!  At  Visit 10/14 we checked FLP LFT.  Recommended starting simvastatin 10 mg. She states that she is taking this as directed. She is having no myalgias or other adverse effects.   Also at her visit 11/25/2012 she reported that she did use Chantix in the past and it worked very well for her. She has stayed clear for 2-3 years with an echo back to smoking about 1-1/2 years ago. At that visit I gave her another prescription of Chantix. At [place] 1/15 she stated that she finished the Chantix 2 weeks ago and has not smoked any for 71 days. Says that she has no cravings to smoke. Says she's even been around smokers at times and has had no cravings. At [place] 05/23/13 she reports she has continued to stay off of the cigarettes and still is having absolutely no cravings. !!!!  Today--12/2013--she says that she had quit smoking 12/01/2012. Says that she had gone to almost her one-year mark but then over Halloween weekend she went to a party. Smoked at that party. Bought a pack of cigarettes that following week. Has been smoking for these past couple of weeks. Wants to get back on Chantix so that she can stop smoking again.  Says that her diet and exercise are the same as they were at the last office visit, at which time she reported the following:  She does no exercise. SHe tries to be compliant with her diet. Says She actually has gotten to where she would prefer  to eat an apple or carrots for snacks. She works at [employer] and has to work 12 hour shifts 3 days a week. Says that she tried joining a gym for [employer] resolution but was unable to stick with that.  No other complaints or concerns other than needing to get back on Chantix so that she can stop smoking again.    Past Medical History  Diagnosis Date  . Diabetes mellitus without complication   . Smoker   . Hyperlipidemia 02/13/2013  . Depression      Home Meds:  Outpatient Prescriptions Prior to Visit  Medication Sig Dispense Refill  . aspirin 81 MG tablet Take 1 tablet (81 mg  total) by mouth daily. 30 tablet 11  . Insulin Glargine (LANTUS SOLOSTAR) 100 UNIT/ML Solostar Pen Inject 100 Units into the skin daily at 10 pm. 30 mL 5  . INVOKANA 300 MG TABS TAKE ONE TABLET BY MOUTH ONCE DAILY 30 tablet 3  . metFORMIN (GLUCOPHAGE) 1000 MG tablet Take 1 tablet (1,000 mg total) by mouth 2 (two) times daily with a meal. 60 tablet 2  . ramipril (ALTACE) 2.5 MG capsule TAKE ONE CAPSULE BY MOUTH ONCE DAILY 30 capsule 0  . simvastatin (ZOCOR) 20 MG tablet TAKE ONE TABLET BY MOUTH AT BEDTIME 90 tablet 0  . Fexofenadine HCl (ALLEGRA PO) Take 1 tablet by mouth daily.     No facility-administered medications prior to visit.     Allergies: No Known Allergies  History   Social History  . Marital Status: Married    Spouse Name: N/A    Number of Children: N/A  . Years of Education: N/A   Occupational History  . Not on file.   Social History Main Topics  . Smoking status: Current Every Day Smoker -- 0.50 packs/day    Types: Cigarettes  . Smokeless tobacco: Never Used  . Alcohol Use: No  . Drug Use: No  . Sexual Activity: Yes    Birth Control/ Protection: None   Other Topics Concern  . Not on file   Social History Narrative    Family History  Problem Relation Age of Onset  . Diabetes Father   . Cancer Neg Hx   . Heart disease Neg Hx      Review of Systems:  See HPI for pertinent ROS. All other ROS negative.    Physical Exam: Blood pressure 104/66, pulse 92, temperature 98.2 F (36.8 C), temperature source Oral, resp. rate 18, weight 208 lb (94.348 kg)., Body mass index is 34.36 kg/(m^2). General: Obese WF. Appears in no acute distress. Neck: Supple. No thyromegaly. No lymphadenopathy. No carotid bruits. Lungs: Clear bilaterally to auscultation without wheezes, rales, or rhonchi. Breathing is unlabored. Heart: RRR with S1 S2. No murmurs, rubs, or gallops. Abdomen: Soft, non-tender, non-distended with normoactive bowel sounds. No hepatomegaly. No  rebound/guarding. No obvious abdominal masses. Musculoskeletal:  Strength and tone normal for age. Extremities/Skin: Warm and dry.  No edema.  Neuro: Alert and oriented X 3. Moves all extremities spontaneously. Gait is normal. CNII-XII grossly in tact. Psych:  Responds to questions appropriately with a normal affect. Diabetic foot exam: inspection is normal. 2+ bilateral dorsalis pedis and posterior tibial pulses. Sensation is intact bilaterally.     ASSESSMENT AND PLAN:  43 y.o. year old female with  1. Diabetes mellitus without complication - COMPLETE METABOLIC PANEL WITH GFR - Hemoglobin A1c  MicroAlbumin--- 05/23/2013  She is on statin. On ACE inhibitor. Added Altace 2.5 mg daily at  OV 05/23/13--she states that she is taking this and is having no adverse effects. On aspirin 81 mg daily--Added at OV 05/23/13--she states that she is taking this daily and is having no bleeding.  Pneumovax 23--given here 05/23/13  She reports that she did have a diabetic eye exam January 2015. She is aware of proper foot care.  2. Hyperlipidemia She is on simvastatin. At lipid panel 05/23/13 I recommended increase simvastatin to 20 mg. She reports that she did make this change. She is fasting today so we will recheck today. She is fasting for recheck FLP LFT.  3. Smoker Prescription for Chantix starting dose pack and Continuing Dosepaks sent in today.  At [place] 05/23/2013 I discussed that she schedule a complete physical exam. At that time she was agreeable to schedule a complete physical exam. We'll perform breast exam pelvic exam, Pap and up date all preventive care at that visit. 05/23/13 I also discussed that she will need a tetanus vaccine but she wanted to just do the pneumonia vaccine that day and hold off on doing Tetanus vaccine at the next visit.  SHE DID NOT SCHEDULE CPE--NEED TO DISCUSS THIS AGAIN AT NEXT OV  Signed, [address] [place], [place], [facility] 12/18/2013 10:53 AM

## 2013-12-31 ENCOUNTER — Other Ambulatory Visit: Payer: Self-pay | Admitting: Physician Assistant

## 2013-12-31 NOTE — Telephone Encounter (Signed)
Refill appropriate and filled per protocol. 

## 2014-01-21 ENCOUNTER — Other Ambulatory Visit: Payer: Self-pay | Admitting: Physician Assistant

## 2014-01-21 NOTE — Telephone Encounter (Signed)
Medication refilled per protocol. 

## 2014-02-02 ENCOUNTER — Other Ambulatory Visit: Payer: Self-pay | Admitting: Physician Assistant

## 2014-02-03 NOTE — Telephone Encounter (Signed)
Medication refilled per protocol. 

## 2014-02-25 ENCOUNTER — Encounter: Payer: Self-pay | Admitting: Physician Assistant

## 2014-03-27 ENCOUNTER — Ambulatory Visit (INDEPENDENT_AMBULATORY_CARE_PROVIDER_SITE_OTHER): Payer: BLUE CROSS/BLUE SHIELD | Admitting: Physician Assistant

## 2014-03-27 ENCOUNTER — Encounter: Payer: Self-pay | Admitting: Physician Assistant

## 2014-03-27 ENCOUNTER — Encounter: Payer: Self-pay | Admitting: Family Medicine

## 2014-03-27 VITALS — BP 106/74 | HR 96 | Temp 98.2°F | Resp 18 | Ht 65.75 in | Wt 215.0 lb

## 2014-03-27 DIAGNOSIS — F32A Depression, unspecified: Secondary | ICD-10-CM

## 2014-03-27 DIAGNOSIS — F329 Major depressive disorder, single episode, unspecified: Secondary | ICD-10-CM

## 2014-03-27 DIAGNOSIS — Z720 Tobacco use: Secondary | ICD-10-CM

## 2014-03-27 DIAGNOSIS — E119 Type 2 diabetes mellitus without complications: Secondary | ICD-10-CM | POA: Diagnosis not present

## 2014-03-27 DIAGNOSIS — Z23 Encounter for immunization: Secondary | ICD-10-CM

## 2014-03-27 DIAGNOSIS — F17200 Nicotine dependence, unspecified, uncomplicated: Secondary | ICD-10-CM

## 2014-03-27 DIAGNOSIS — Z1239 Encounter for other screening for malignant neoplasm of breast: Secondary | ICD-10-CM | POA: Diagnosis not present

## 2014-03-27 DIAGNOSIS — E785 Hyperlipidemia, unspecified: Secondary | ICD-10-CM | POA: Diagnosis not present

## 2014-03-27 LAB — HEMOGLOBIN A1C, FINGERSTICK: Hgb A1C (fingerstick): 6.5 % — ABNORMAL HIGH (ref <5.7)

## 2014-03-27 NOTE — Progress Notes (Signed)
Patient ID: Jalexus Brett MRN: [id number], DOB: 1971-01-17, 44 y.o. Date of Encounter: @DATE @  Chief Complaint:  Chief Complaint  Patient presents with  . 44 mth check up    is fasting  . Medication Refill    HPI: 44 y.o. year old white female  presents for routine followup.  She saw me for an office visit 11/22/12. Prior to that visit her last visit with our office had been August 2011. She had also reported in the past that she was seeing Dr. Isabel Caprice for her diabetes. She came to see me 11/22/12 asked her about this. She stated that she had gotten frustrated because she felt like she kept adjusting medications for her blood sugar but they were never getting control. Therefore she ended up stopping all of her medications and stopped all followup around 2010. She reported that back then she was on insulin 70/30 at 30 units twice a day. She had been using no diabetic treatment since 2010. Asked what prompted her to come in to restart treatment 11/22/12. She stated that her fianc recently had dorsum of the amputation secondary to uncontrolled diabetes.  At visit 11/22/12 A1c was 9.9. Started Lantus at that time. Plan to check labs and creatinine normal at metformin.  She had followup with me 12/06/12. Metformin was added. She is then to continue to titrate her Lantus.  At [place] 01/2013 she reported that she was able to titrate the metformin up to 1000 mg twice a day. She was having no adverse effects and was tolerating this well.  At office visit January 2015 we added Invokana. She said that this made a huge difference in her blood sugars. Says it definitely decreasing quite a bit. After she added that she started to titrate back up her insulin. She brought in her log sheet today. Lantus is now at about 90 units. Fasting blood sugars are very consistent and are between 92-122. Blood sugars are extremely consistent and are excellent  !!!  At  Visit 10/14 we checked FLP LFT.  Recommended starting simvastatin 10 mg. She states that she is taking this as directed. She is having no myalgias or other adverse effects.   Also at her visit 11/25/2012 she reported that she did use Chantix in the past and it worked very well for her. She has stayed clear for 2-3 years but then back to smoking about 1-1/2 years ago.  At that visit I gave her another prescription of Chantix. At [place] 1/15 she stated that she finished the Chantix 2 weeks ago and has not smoked any for 71 days.  Said that she has no cravings to smoke. Said she had even been around smokers at times and has had no cravings. At [place] 05/23/13 she reports she has continued to stay off of the cigarettes and still is having absolutely no cravings. !!!!  Unfortunately, at [place] 12/2013--she says that she had quit smoking 12/01/2012. Says that she had gone to almost her one-year mark but then over Halloween weekend she went to a party. Smoked at that party. Bought a pack of cigarettes that following week. Has been smoking for these past couple of weeks. Wants to get back on Chantix so that she can stop smoking again. At [place] 12/18/2013--prescribed Chantix. At [place] 03/27/2014--says that she is still taking the Chantix. Says that she is off of cigarettes.  Says that even with the coupon the Chantix is very expensive. So says that she has gained 7 pounds since  her last visit and thinks that a lot of that has to do with munching in place of smoking.   Says that her diet and exercise are the same as they were at the last office visit, at which time she reported the following:  She does no exercise. SHe tries to be compliant with her diet. Says She actually has gotten to where she would prefer to eat an apple or carrots for snacks. She works at [employer] and has to work 12 hour shifts 3 days a week. Says that she tried joining a gym for [employer] resolution but was unable to stick with that.  At office visit 03/28/14 she states that she has an  eye appointment later today. She says that her fianc who has diabetes as having a lot of problems with his eyes. She says that when she went for her eye exam last year they told her they did see some problems with her retina and told her to follow up with a retina specialist but she never did.  12/2013--A1c came back 6.4. On that lab results were told her to make sure she was getting no low readings. She says that she was getting some low readings in the night so she has decreased her Lantus dose since then. Says that she recently has not been checking her blood sugar on a daily basis anymore.  Past Medical History  Diagnosis Date  . Diabetes mellitus without complication   . Smoker   . Hyperlipidemia 02/13/2013  . Depression      Home Meds:  Outpatient Prescriptions Prior to Visit  Medication Sig Dispense Refill  . aspirin 81 MG tablet Take 1 tablet (81 mg total) by mouth daily. 30 tablet 11  . INVOKANA 300 MG TABS tablet TAKE ONE TABLET BY MOUTH ONCE DAILY 30 tablet 3  . LANTUS SOLOSTAR 100 UNIT/ML Solostar Pen INJECT 100 UNITS INTO THE SKIN DAILY AT 10 PM 30 mL 3  . metFORMIN (GLUCOPHAGE) 1000 MG tablet TAKE ONE TABLET BY MOUTH TWICE DAILY WITH A MEAL 60 tablet 3  . Multiple Vitamin (MULTIVITAMIN) tablet Take 1 tablet by mouth daily.    . ramipril (ALTACE) 2.5 MG capsule Take 1 capsule (2.5 mg total) by mouth daily. 30 capsule 3  . simvastatin (ZOCOR) 20 MG tablet TAKE ONE TABLET BY MOUTH AT BEDTIME 30 tablet 3  . varenicline (CHANTIX CONTINUING MONTH PAK) 1 MG tablet Take 1 tablet (1 mg total) by mouth 2 (two) times daily. 60 tablet 5  . Fexofenadine HCl (ALLEGRA PO) Take 1 tablet by mouth daily.    . varenicline (CHANTIX) 0.5 MG tablet Take 1 tablet (0.5 mg total) by mouth 2 (two) times daily. 60 tablet 0   No facility-administered medications prior to visit.     Allergies: No Known Allergies  History   Social History  . Marital Status: Married    Spouse Name: N/A  .  Number of Children: N/A  . Years of Education: N/A   Occupational History  . Not on file.   Social History Main Topics  . Smoking status: Current Some Day Smoker -- 0.50 packs/day    Types: Cigarettes  . Smokeless tobacco: Never Used     Comment: has been trying to quit  . Alcohol Use: No  . Drug Use: No  . Sexual Activity: Yes    Birth Control/ Protection: None   Other Topics Concern  . Not on file   Social History Narrative  Family History  Problem Relation Age of Onset  . Diabetes Father   . Cancer Neg Hx   . Heart disease Neg Hx      Review of Systems:  See HPI for pertinent ROS. All other ROS negative.    Physical Exam: Blood pressure 106/74, pulse 96, temperature 98.2 F (36.8 C), temperature source Oral, resp. rate 18, height 5' 5.75" (1.67 m), weight 215 lb (97.523 kg)., Body mass index is 34.97 kg/(m^2). General: Obese WF. Appears in no acute distress. Neck: Supple. No thyromegaly. No lymphadenopathy. No carotid bruits. Lungs: Clear bilaterally to auscultation without wheezes, rales, or rhonchi. Breathing is unlabored. Heart: RRR with S1 S2. No murmurs, rubs, or gallops. Abdomen: Soft, non-tender, non-distended with normoactive bowel sounds. No hepatomegaly. No rebound/guarding. No obvious abdominal masses. Musculoskeletal:  Strength and tone normal for age. Extremities/Skin: Warm and dry.  No edema.  Neuro: Alert and oriented X 3. Moves all extremities spontaneously. Gait is normal. CNII-XII grossly in tact. Psych:  Responds to questions appropriately with a normal affect. Diabetic foot exam: inspection is normal. 2+ bilateral dorsalis pedis and posterior tibial pulses. Sensation is intact bilaterally.     ASSESSMENT AND PLAN:  44 y.o. year old female with  1. Diabetes mellitus without complication  - Hemoglobin A1c  MicroAlbumin--- 05/23/2013  She is on statin. On ACE inhibitor. Added Altace 2.5 mg daily at [place] 05/23/13--she states that she is taking  this and is having no adverse effects. On aspirin 81 mg daily--Added at [facility] 05/23/13--she states that she is taking this daily and is having no bleeding.  Pneumovax 23--given here 05/23/13  She reports that she did have a diabetic eye exam January 2015.  At office visit 03/28/14 she states that she has an eye appointment later today. She says that her fianc who has diabetes as having a lot of problems with his eyes. She says that when she went for her eye exam last year they told her they did see some problems with her retina and told her to follow up with a retina specialist but she never did.  She is aware of proper foot care.   2. Hyperlipidemia She is on simvastatin. At lipid panel 05/23/13 I recommended increase simvastatin to 20 mg.  Lipid panel 12/18/13 showed triglyceride 196     HDL 34     LDL 68  3. Smoker She is currently using Chantix and is not smoking  I discussed her scheduling a complete physical exam and multiple visits and she never has come back in for this. Update as much preventive care as I can other than her pelvic exam and Pap smear.  Mammogram: She states she has never had mammogram. She does report that her maternal grandmother had breast cancer. Thinks was diagnosed around age 52 and she died at around age 44. And is agreeable for me to schedule a mammogram.  Immunizations: Patient agreeable for [place] to give T dap today.----T dap given here 03/27/14 Pneumovax given here 05/23/13 influenza vaccine--- she has received for this flu season.  Signed, [address] [place], [place], [facility] 03/27/2014 9:55 AM

## 2014-04-01 ENCOUNTER — Encounter: Payer: Self-pay | Admitting: *Deleted

## 2014-04-03 ENCOUNTER — Encounter: Payer: Self-pay | Admitting: Physician Assistant

## 2014-04-16 ENCOUNTER — Ambulatory Visit
Admission: RE | Admit: 2014-04-16 | Discharge: 2014-04-16 | Disposition: A | Discharge status: Home / Self Care | Payer: BLUE CROSS/BLUE SHIELD | Source: Ambulatory Visit | Attending: Physician Assistant | Admitting: Physician Assistant

## 2014-04-16 DIAGNOSIS — Z1239 Encounter for other screening for malignant neoplasm of breast: Secondary | ICD-10-CM

## 2014-04-22 ENCOUNTER — Ambulatory Visit (INDEPENDENT_AMBULATORY_CARE_PROVIDER_SITE_OTHER): Payer: BLUE CROSS/BLUE SHIELD | Admitting: Family Medicine

## 2014-04-22 ENCOUNTER — Encounter: Payer: Self-pay | Admitting: Family Medicine

## 2014-04-22 ENCOUNTER — Telehealth: Payer: Self-pay | Admitting: *Deleted

## 2014-04-22 VITALS — BP 126/64 | HR 76 | Temp 97.8°F | Resp 14 | Ht 65.75 in | Wt 210.0 lb

## 2014-04-22 DIAGNOSIS — M545 Low back pain, unspecified: Secondary | ICD-10-CM

## 2014-04-22 DIAGNOSIS — R300 Dysuria: Secondary | ICD-10-CM | POA: Diagnosis not present

## 2014-04-22 LAB — URINALYSIS, ROUTINE W REFLEX MICROSCOPIC
Bilirubin Urine: NEGATIVE
Glucose, UA: >1000 mg/dL — AB
HGB URINE DIPSTICK: NEGATIVE
Ketones, ur: NEGATIVE mg/dL
Leukocytes, UA: NEGATIVE
Nitrite: NEGATIVE
PH: 5 (ref 5.0–8.0)
Protein, ur: NEGATIVE mg/dL
SPECIFIC GRAVITY, URINE: 1.025 (ref 1.005–1.030)
Urobilinogen, UA: 0.2 mg/dL (ref 0.0–1.0)

## 2014-04-22 LAB — URINALYSIS, MICROSCOPIC ONLY
Casts: NONE SEEN
Crystals: NONE SEEN
RBC / HPF: NONE SEEN RBC/hpf (ref <3)

## 2014-04-22 MED ORDER — HYDROCODONE-ACETAMINOPHEN 5-325 MG PO TABS: 1.0000 | ORAL_TABLET | Freq: Four times a day (QID) | ORAL | Status: DC | PRN

## 2014-04-22 NOTE — Patient Instructions (Signed)
Take the pain medication as prescribed  I will call with culture results  Continue all other medications F/U as previous

## 2014-04-22 NOTE — Telephone Encounter (Signed)
Received call from patient.   Reports that she took (1) tab of Norco, but noted that she had some dizziness, but the pain did not subside.   MD made aware and advised that patient can take (2) tabs of Norco, but she needs to take medication with meal.   Call placed to patient and patient made aware.

## 2014-04-22 NOTE — Progress Notes (Signed)
Patient ID: Mckenzie Tyler, female   DOB: [birth year], 44 y.o.   MRN: [id number]   Subjective:    Patient ID: Mckenzie Tyler, female    DOB: [birth year], 44 y.o.   MRN: [id number]  Patient presents for Possible UTI  patient here with right lower back pain. She states on Sunday she began having a burning/tingling sensation across her lower back and a stabbing sensation. She denies any injury to her back. She's not had any urinary frequency or pressure or burning with urination. No vaginal discharge or change in bowels in general. No fever or recent illness. She did try putting a heating patch in the area but this is actually irritated. She has not seen any particular rash. She is diabetic and her blood sugars have been good and her diabetes is well controlled    Review Of Systems:  GEN- denies fatigue, fever, weight loss,weakness, recent illness HEENT- denies eye drainage, change in vision, nasal discharge, CVS- denies chest pain, palpitations RESP- denies SOB, cough, wheeze ABD- denies N/V, change in stools, abd pain GU- denies dysuria, hematuria, dribbling, incontinence MSK- denies joint pain, +muscle aches, injury Neuro- denies headache, dizziness, syncope, seizure activity       Objective:    BP 126/64 mmHg  Pulse 76  Temp(Src) 97.8 F (36.6 C) (Oral)  Resp 14  Ht 5' 5.75" (1.67 m)  Wt 210 lb (95.255 kg)  BMI 34.16 kg/m2 GEN- NAD, alert and oriented x3 CVS- RRR, no murmur RESP-CTAB ABD-NABS,soft,NT,ND MSK- Spine NT, mild TTP in right lower paraspinals, no spasm, good ROM, neg SLR Skin- mild erythema, no blistering lesions or rash EXT- No edema Pulses- Radial, - 2+        Assessment & Plan:      Problem List Items Addressed This Visit    None    Visit Diagnoses    Dysuria    -  Primary    Relevant Orders    Urinalysis, Routine w reflex microscopic (Completed)    Urine culture    Right-sided low back pain without sciatica        UA is neg, glucose due to  Invokana, send for culture but not classic of UTI symptoms, no true MSK symptoms, query shingles based on burning/tingling pain, will monitor, she will call if any changes, given norco for pain, d/c heating patch as causing more irritation to area    Relevant Medications    NORCO 5-325 MG PO TABS       Note: This dictation was prepared with Dragon dictation along with smaller phrase technology. Any transcriptional errors that result from this process are unintentional.

## 2014-04-23 LAB — URINE CULTURE: Colony Count: 9000

## 2014-05-06 ENCOUNTER — Other Ambulatory Visit: Payer: Self-pay | Admitting: Physician Assistant

## 2014-05-06 NOTE — Telephone Encounter (Signed)
Medication refilled per protocol. 

## 2014-05-07 ENCOUNTER — Other Ambulatory Visit: Payer: Self-pay | Admitting: Physician Assistant

## 2014-05-07 NOTE — Telephone Encounter (Signed)
Medication refilled per protocol. 

## 2014-05-22 ENCOUNTER — Other Ambulatory Visit: Payer: Self-pay | Admitting: Physician Assistant

## 2014-05-22 NOTE — Telephone Encounter (Signed)
Medication refilled per protocol. 

## 2014-06-14 ENCOUNTER — Other Ambulatory Visit: Payer: Self-pay | Admitting: Physician Assistant

## 2014-06-16 NOTE — Telephone Encounter (Signed)
Medication refill per protocol °

## 2014-06-27 ENCOUNTER — Other Ambulatory Visit: Payer: Self-pay | Admitting: Physician Assistant

## 2014-06-27 NOTE — Telephone Encounter (Signed)
Medication refilled per protocol. 

## 2014-07-02 ENCOUNTER — Ambulatory Visit (INDEPENDENT_AMBULATORY_CARE_PROVIDER_SITE_OTHER): Payer: BLUE CROSS/BLUE SHIELD | Admitting: Physician Assistant

## 2014-07-02 ENCOUNTER — Encounter: Payer: Self-pay | Admitting: Physician Assistant

## 2014-07-02 ENCOUNTER — Encounter (INDEPENDENT_AMBULATORY_CARE_PROVIDER_SITE_OTHER): Payer: Self-pay

## 2014-07-02 VITALS — BP 90/60 | HR 66 | Temp 97.7°F | Resp 18 | Wt 211.0 lb

## 2014-07-02 DIAGNOSIS — F329 Major depressive disorder, single episode, unspecified: Secondary | ICD-10-CM

## 2014-07-02 DIAGNOSIS — F32A Depression, unspecified: Secondary | ICD-10-CM

## 2014-07-02 DIAGNOSIS — E119 Type 2 diabetes mellitus without complications: Secondary | ICD-10-CM | POA: Diagnosis not present

## 2014-07-02 DIAGNOSIS — E785 Hyperlipidemia, unspecified: Secondary | ICD-10-CM | POA: Diagnosis not present

## 2014-07-02 DIAGNOSIS — F17200 Nicotine dependence, unspecified, uncomplicated: Secondary | ICD-10-CM

## 2014-07-02 DIAGNOSIS — Z720 Tobacco use: Secondary | ICD-10-CM

## 2014-07-02 LAB — COMPLETE METABOLIC PANEL WITH GFR
ALBUMIN: 4.4 g/dL (ref 3.5–5.2)
ALT: 46 U/L — AB (ref 0–35)
AST: 34 U/L (ref 0–37)
Alkaline Phosphatase: 35 U/L — ABNORMAL LOW (ref 39–117)
BUN: 20 mg/dL (ref 6–23)
CHLORIDE: 102 meq/L (ref 96–112)
CO2: 26 mEq/L (ref 19–32)
CREATININE: 0.72 mg/dL (ref 0.50–1.10)
Calcium: 9.5 mg/dL (ref 8.4–10.5)
GFR, Est African American: >89 mL/min
GFR, Est Non African American: >89 mL/min
GLUCOSE: 113 mg/dL — AB (ref 70–99)
POTASSIUM: 4.8 meq/L (ref 3.5–5.3)
SODIUM: 139 meq/L (ref 135–145)
TOTAL PROTEIN: 6.7 g/dL (ref 6.0–8.3)
Total Bilirubin: 0.5 mg/dL (ref 0.2–1.2)

## 2014-07-02 LAB — LIPID PANEL
CHOL/HDL RATIO: 4.6 ratio
Cholesterol: 132 mg/dL (ref 0–200)
HDL: 29 mg/dL — AB (ref >=46)
LDL CALC: 61 mg/dL (ref 0–99)
Triglycerides: 211 mg/dL — ABNORMAL HIGH (ref <150)
VLDL: 42 mg/dL — AB (ref 0–40)

## 2014-07-02 LAB — HEMOGLOBIN A1C
Hgb A1c MFr Bld: 6.5 % — ABNORMAL HIGH (ref <5.7)
Mean Plasma Glucose: 140 mg/dL — ABNORMAL HIGH (ref <117)

## 2014-07-02 MED ORDER — CANAGLIFLOZIN 300 MG PO TABS: 300.0000 mg | ORAL_TABLET | Freq: Every day | ORAL | Status: DC

## 2014-07-02 MED ORDER — RAMIPRIL 2.5 MG PO CAPS: 2.5000 mg | ORAL_CAPSULE | Freq: Every day | ORAL | Status: DC

## 2014-07-02 MED ORDER — METFORMIN HCL 1000 MG PO TABS: ORAL_TABLET | ORAL | Status: DC

## 2014-07-02 NOTE — Progress Notes (Signed)
Patient ID: Mckenzie Tyler MRN: [id number], DOB: [birth year], 44 y.o. Date of Encounter: @DATE @  Chief Complaint:  Chief Complaint  Patient presents with  . follow up    3 months     HPI: 44 y.o. year old white female  presents for routine followup.  She saw me for an office visit 11/22/12. Prior to that visit her last visit with our office had been August 2011. She had also reported in the past that she was seeing Dr. Isabel Caprice for her diabetes. She came to see me 11/22/12 asked her about this. She stated that she had gotten frustrated because she felt like she kept adjusting medications for her blood sugar but they were never getting control. Therefore she ended up stopping all of her medications and stopped all followup around 2010. She reported that back then she was on insulin 70/30 at 30 units twice a day. She had been using no diabetic treatment since 2010. Asked what prompted her to come in to restart treatment 11/22/12. She stated that her fianc recently had dorsum of the amputation secondary to uncontrolled diabetes.  At visit 11/22/12 A1c was 9.9. Started Lantus at that time. Plan to check labs and creatinine normal at metformin.  She had followup with me 12/06/12. Metformin was added. She is then to continue to titrate her Lantus.  At [place] 01/2013 she reported that she was able to titrate the metformin up to 1000 mg twice a day. She was having no adverse effects and was tolerating this well.  At office visit January 2015 we added Invokana. She said that this made a huge difference in her blood sugars. Says it definitely decreasing quite a bit. After she added that she started to titrate back up her insulin. She brought in her log sheet today. Lantus is now at about 90 units. Fasting blood sugars are very consistent and are between 92-122. Blood sugars are extremely consistent and are excellent  !!!  At  Visit 10/14 we checked FLP LFT. Recommended starting simvastatin 10 mg.  She states that she is taking this as directed. She is having no myalgias or other adverse effects.   Also at her visit 11/25/2012 she reported that she did use Chantix in the past and it worked very well for her. She has stayed clear for 2-3 years but then back to smoking about 1-1/2 years ago.  At that visit I gave her another prescription of Chantix. At [place] 1/15 she stated that she finished the Chantix 2 weeks ago and has not smoked any for 71 days.  Said that she has no cravings to smoke. Said she had even been around smokers at times and has had no cravings. At [place] 05/23/13 she reports she has continued to stay off of the cigarettes and still is having absolutely no cravings. !!!!  Unfortunately, at [place] 12/2013--she says that she had quit smoking 12/01/2012. Says that she had gone to almost her one-year mark but then over Halloween weekend she went to a party. Smoked at that party. Bought a pack of cigarettes that following week. Has been smoking for these past couple of weeks. Wants to get back on Chantix so that she can stop smoking again. At [place] 12/18/2013--prescribed Chantix. At [place] 03/27/2014--says that she is still taking the Chantix. Says that she is off of cigarettes.  Says that even with the coupon the Chantix is very expensive. So says that she has gained 7 pounds since her last visit and thinks  that a lot of that has to do with munching in place of smoking.   Says that her diet and exercise are the same as they were at the last office visit, at which time she reported the following:  She does no exercise. SHe tries to be compliant with her diet. Says She actually has gotten to where she would prefer to eat an apple or carrots for snacks. She works at [employer] and has to work 12 hour shifts 3 days a week. Says that she tried joining a gym for [employer] resolution but was unable to stick with that.  At office visit 03/28/14 she states that she has an eye appointment later today. She says  that her fianc who has diabetes as having a lot of problems with his eyes. She says that when she went for her eye exam last year they told her they did see some problems with her retina and told her to follow up with a retina specialist but she never did.  12/2013--A1c came back 6.4. On that lab results were told her to make sure she was getting no low readings. She says that she was getting some low readings in the night so she has decreased her Lantus dose since then. Says that she recently has not been checking her blood sugar on a daily basis anymore.  07/02/2014--she says that when she went to the eye appointment 03/27/14. Since then went to a retina specialist. Was given bilateral eye injections--For diabetic retinopathy. Has to repeat these injections every 4-6 weeks. Says that she is not smoking at all. Completed her Chantix 1 week ago. Says that she has gotten to the point that she wants to change her life and make lifestyle changes. Has been using juice cleanses and feels so much better with this. Is not eating the foods that she used to in the past anymore. Says that she has been able to decrease her Lantus from 100 down to 60 units. Says that the lowest sugar she gets is about 93--and that is fasting in the morning. No complaints or concerns at this visit.  Past Medical History  Diagnosis Date  . Diabetes mellitus without complication   . Smoker   . Hyperlipidemia 02/13/2013  . Depression      Home Meds:  Outpatient Prescriptions Prior to Visit  Medication Sig Dispense Refill  . aspirin 81 MG tablet Take 1 tablet (81 mg total) by mouth daily. 30 tablet 11  . LANTUS SOLOSTAR 100 UNIT/ML Solostar Pen INJECT 100 UNITS INTO THE SKIN DAILY AT 10 PM 30 mL 0  . Multiple Vitamin (MULTIVITAMIN) tablet Take 1 tablet by mouth daily.    . simvastatin (ZOCOR) 20 MG tablet TAKE ONE TABLET BY MOUTH AT BEDTIME 90 tablet 0  . INVOKANA 300 MG TABS tablet TAKE ONE TABLET BY MOUTH ONCE DAILY 30  tablet 2  . metFORMIN (GLUCOPHAGE) 1000 MG tablet TAKE ONE TABLET BY MOUTH TWICE DAILY WITH MEAL 60 tablet 0  . ramipril (ALTACE) 2.5 MG capsule TAKE ONE CAPSULE BY MOUTH ONCE DAILY 30 capsule 2  . HYDROcodone-acetaminophen (NORCO) 5-325 MG per tablet Take 1 tablet by mouth every 6 (six) hours as needed for moderate pain. 30 tablet 0  . varenicline (CHANTIX CONTINUING MONTH PAK) 1 MG tablet Take 1 tablet (1 mg total) by mouth 2 (two) times daily. 60 tablet 5   No facility-administered medications prior to visit.     Allergies: No Known Allergies  History  Social History  . Marital Status: Married    Spouse Name: N/A  . Number of Children: N/A  . Years of Education: N/A   Occupational History  . Not on file.   Social History Main Topics  . Smoking status: Former Smoker -- 0.50 packs/day    Types: Cigarettes    Quit date: 01/31/2014  . Smokeless tobacco: Never Used     Comment: has been trying to quit  . Alcohol Use: No  . Drug Use: No  . Sexual Activity: Yes    Birth Control/ Protection: None   Other Topics Concern  . Not on file   Social History Narrative    Family History  Problem Relation Age of Onset  . Diabetes Father   . Cancer Neg Hx   . Heart disease Neg Hx      Review of Systems:  See HPI for pertinent ROS. All other ROS negative.    Physical Exam: Blood pressure 90/60, pulse 66, temperature 97.7 F (36.5 C), temperature source Oral, resp. rate 18, weight 211 lb (95.709 kg)., Body mass index is 34.32 kg/(m^2). General: Obese WF. Appears in no acute distress. Neck: Supple. No thyromegaly. No lymphadenopathy. No carotid bruits. Lungs: Clear bilaterally to auscultation without wheezes, rales, or rhonchi. Breathing is unlabored. Heart: RRR with S1 S2. No murmurs, rubs, or gallops. Abdomen: Soft, non-tender, non-distended with normoactive bowel sounds. No hepatomegaly. No rebound/guarding. No obvious abdominal masses. Musculoskeletal:  Strength and tone  normal for age. Extremities/Skin: Warm and dry.  No edema.  Neuro: Alert and oriented X 3. Moves all extremities spontaneously. Gait is normal. CNII-XII grossly in tact. Psych:  Responds to questions appropriately with a normal affect. Diabetic foot exam: inspection is normal. 2+ bilateral dorsalis pedis and posterior tibial pulses. Sensation is intact bilaterally.     ASSESSMENT AND PLAN:  44 y.o. year old female with  1. Diabetes mellitus without complication   Last hemoglobin A1c 03/27/14 was 6.5. At visit 07/02/14 discussed with her that at this point our biggest concern is going to be making sure she does not have low blood sugars. Says that the lowest she has gotten is 60 and that has been early morning fasting.  - Hemoglobin A1c  MicroAlbumin--- 05/23/2013--- Repeated 07/02/14  She is on statin. On ACE inhibitor. Added Altace 2.5 mg daily at [place] 05/23/13--she states that she is taking this and is having no adverse effects. On aspirin 81 mg daily--Added at [place] 05/23/13--she states that she is taking this daily and is having no bleeding.  Pneumovax 23--given here 05/23/13  She reports that she did have a diabetic eye exam January 2015. Had another routine diabetic eye exam 03/27/14. Since then, has been following up with retina specialist and getting injections in both eyes every 4-6 weeks.  At office visit 03/28/14 she states that she has an eye appointment later today. She says that her fianc who has diabetes as having a lot of problems with his eyes. She says that when she went for her eye exam last year they told her they did see some problems with her retina and told her to follow up with a retina specialist but she never did.  She is aware of proper foot care.   2. Hyperlipidemia She is on simvastatin. At lipid panel 05/23/13 I recommended increase simvastatin to 20 mg.  Lipid panel 12/18/13 showed triglyceride 196     HDL 34     LDL 68  3. Smoker She is NOT  smoking!!!   Completed Chantix 1 week ago.   I discussed her scheduling a complete physical exam and multiple visits and she never has come back in for this. Update as much preventive care as I can other than her pelvic exam and Pap smear.  Mammogram: AT [place] 03/27/2014-- She states she has never had mammogram. She does report that her maternal grandmother had breast cancer. Thinks was diagnosed around age 71 and she died at around age 75. And is agreeable for me to schedule a mammogram. AT [place] 07/02/14--- she reports that she did have her mammogram and it was negative.  Immunizations: T dap----------- given here 03/27/14 Pneumovax---- given here 05/23/13 Influenza vaccine--- she  received for 2015-2016 flu season.  [address] [place], [place], [facility] 07/02/2014 8:37 AM

## 2014-07-03 ENCOUNTER — Encounter: Payer: Self-pay | Admitting: *Deleted

## 2014-07-03 LAB — MICROALBUMIN, URINE: Microalb, Ur: 0.6 mg/dL (ref <2.0)

## 2014-07-28 ENCOUNTER — Other Ambulatory Visit: Payer: Self-pay

## 2014-07-29 ENCOUNTER — Other Ambulatory Visit: Payer: Self-pay | Admitting: Physician Assistant

## 2014-07-29 NOTE — Telephone Encounter (Signed)
Medication refilled per protocol. 

## 2014-08-26 ENCOUNTER — Other Ambulatory Visit: Payer: Self-pay | Admitting: Physician Assistant

## 2014-08-26 NOTE — Telephone Encounter (Signed)
Refill appropriate and filled per protocol. 

## 2014-10-01 ENCOUNTER — Ambulatory Visit (INDEPENDENT_AMBULATORY_CARE_PROVIDER_SITE_OTHER): Payer: BLUE CROSS/BLUE SHIELD | Admitting: Physician Assistant

## 2014-10-01 ENCOUNTER — Encounter: Payer: Self-pay | Admitting: Physician Assistant

## 2014-10-01 VITALS — BP 100/68 | HR 82 | Temp 98.1°F | Resp 18 | Wt 210.0 lb

## 2014-10-01 DIAGNOSIS — F17200 Nicotine dependence, unspecified, uncomplicated: Secondary | ICD-10-CM

## 2014-10-01 DIAGNOSIS — G5602 Carpal tunnel syndrome, left upper limb: Secondary | ICD-10-CM | POA: Diagnosis not present

## 2014-10-01 DIAGNOSIS — G5601 Carpal tunnel syndrome, right upper limb: Secondary | ICD-10-CM | POA: Diagnosis not present

## 2014-10-01 DIAGNOSIS — E119 Type 2 diabetes mellitus without complications: Secondary | ICD-10-CM | POA: Diagnosis not present

## 2014-10-01 DIAGNOSIS — M7712 Lateral epicondylitis, left elbow: Secondary | ICD-10-CM

## 2014-10-01 DIAGNOSIS — E785 Hyperlipidemia, unspecified: Secondary | ICD-10-CM

## 2014-10-01 DIAGNOSIS — M7711 Lateral epicondylitis, right elbow: Secondary | ICD-10-CM | POA: Diagnosis not present

## 2014-10-01 DIAGNOSIS — Z720 Tobacco use: Secondary | ICD-10-CM

## 2014-10-01 DIAGNOSIS — F329 Major depressive disorder, single episode, unspecified: Secondary | ICD-10-CM | POA: Diagnosis not present

## 2014-10-01 DIAGNOSIS — G5603 Carpal tunnel syndrome, bilateral upper limbs: Secondary | ICD-10-CM | POA: Insufficient documentation

## 2014-10-01 DIAGNOSIS — G5600 Carpal tunnel syndrome, unspecified upper limb: Secondary | ICD-10-CM | POA: Insufficient documentation

## 2014-10-01 DIAGNOSIS — F32A Depression, unspecified: Secondary | ICD-10-CM

## 2014-10-01 LAB — HEMOGLOBIN A1C, FINGERSTICK: HEMOGLOBIN A1C, FINGERSTICK: 6.7 % — AB (ref <5.7)

## 2014-10-01 NOTE — Progress Notes (Signed)
Patient ID: Mckenzie Tyler MRN: [id number], DOB: [birth year], 44 y.o. Date of Encounter: @DATE @  Chief Complaint:  Chief Complaint  Patient presents with  . Follow-up    3 mos, pt states her fingers on both hands and elbows joint always sore    HPI: 44 y.o. year old white female  presents for routine followup.  She saw me for an office visit 11/22/12. Prior to that visit her last visit with our office had been August 2011. She had also reported in the past that she was seeing Dr. Isabel Caprice for her diabetes. She came to see me 11/22/12 asked her about this. She stated that she had gotten frustrated because she felt like she kept adjusting medications for her blood sugar but they were never getting control. Therefore she ended up stopping all of her medications and stopped all followup around 2010. She reported that back then she was on insulin 70/30 at 30 units twice a day. She had been using no diabetic treatment since 2010. Asked what prompted her to come in to restart treatment 11/22/12. She stated that her fianc recently had dorsum of the amputation secondary to uncontrolled diabetes.  At visit 11/22/12 A1c was 9.9. Started Lantus at that time. Plan to check labs and creatinine normal at metformin.  She had followup with me 12/06/12. Metformin was added. She is then to continue to titrate her Lantus.  At [facility] 01/2013 she reported that she was able to titrate the metformin up to 1000 mg twice a day. She was having no adverse effects and was tolerating this well.  At office visit January 2015 we added Invokana. She said that this made a huge difference in her blood sugars. Says it definitely decreasing quite a bit.  At  Visit 10/14 we checked FLP LFT. Recommended starting simvastatin 10 mg. She states that she is taking this as directed. She is having no myalgias or other adverse effects.   Also at her visit 11/25/2012 she reported that she did use Chantix in the past and it worked very  well for her. She has stayed clear for 2-3 years but then back to smoking about 1-1/2 years ago.  At that visit I gave her another prescription of Chantix. At [facility] 1/15 she stated that she finished the Chantix 2 weeks ago and has not smoked any for 71 days.  Said that she has no cravings to smoke. Said she had even been around smokers at times and has had no cravings. At [facility] [place] 05/23/13 she reports she has continued to stay off of the cigarettes and still is having absolutely no cravings. !!!!  Unfortunately, at [facility] 12/2013--she says that she had quit smoking 12/01/2012. Says that she had gone to almost her one-year mark but then over Halloween weekend she went to a party. Smoked at that party. Bought a pack of cigarettes that following week. Has been smoking for these past couple of weeks. Wants to get back on Chantix so that she can stop smoking again. At [place] 12/18/2013--prescribed Chantix. At [place] 03/27/2014--says that she is still taking the Chantix. Says that she is off of cigarettes.  Says that even with the coupon the Chantix is very expensive. So says that she has gained 7 pounds since her last visit and thinks that a lot of that has to do with munching in place of smoking.  At [place] 10/01/2014--- says that she has stayed off of the cigarettes and is not smoking at all. Says that she  is trying to stay away from her friends that do smoke.   She does no exercise. SHe tries to be compliant with her diet. Says She actually has gotten to where she would prefer to eat an apple or carrots for snacks. She works at [employer] and has to work 12 hour shifts 3 days a week.  At [place] 10/01/2014--- she says that she has been using something called [employer]--says that she orders this on line. Says that it is a lot of protein. She drinks a shake in the morning and a shake around noon and then eats dinner meal. Says that she eats healthy snacks between so that she is taking in something about every 3 hours. Eats something like  carrots with hummus. Drinks 1 gallon of water a day.  At office visit 03/28/14 she states that she has an eye appointment later today. She says that her fianc who has diabetes as having a lot of problems with his eyes. She says that when she went for her eye exam last year they told her they did see some problems with her retina and told her to follow up with a retina specialist but she never did.  12/2013--A1c came back 6.4. On that lab results were told her to make sure she was getting no low readings. She says that she was getting some low readings in the night so she has decreased her Lantus dose since then. Says that she recently has not been checking her blood sugar on a daily basis anymore.  07/02/2014--she says that when she went to the eye appointment 03/27/14. Since then went to a retina specialist. Was given bilateral eye injections--For diabetic retinopathy. Has to repeat these injections every 4-6 weeks. Says that she is not smoking at all. Completed her Chantix 1 week ago. Says that she has gotten to the point that she wants to change her life and make lifestyle changes. Has been using juice cleanses and feels so much better with this. Is not eating the foods that she used to in the past anymore. Says that she has been able to decrease her Lantus from 100 down to 60 units. Says that the lowest sugar she gets is about 93--and that is fasting in the morning. No complaints or concerns at this visit.  At [facility] 10/01/2014---  She says that she did "2 series of injections to the eye". Says that when she completed this, the Retina Specialist told her that they did not think she would need any further injections. Says she goes for follow-up in October. Says that she has remained off of cigarettes. Says that she is using [employer] which is something she ordered on line. It is high protein. She drinks one shake in the morning, 1 shake in the afternoon, and then eats a dinner meal. Eats healthy snacks  between such as hummus and carrots. Drinks 1 gallon of water per day. Says that her Lantus is still at 60 units--- but that if she knows she has eaten very low amount of carbs, then she may just use Lantus 50 units but if she eats spaghetti or something then she will use 60.  At [place] [facility] 10/01/2014:  She reports that on both hands:  her thumb and all fingers except for her pinky fingers: These feel numb and tingly when she wakes up in the morning. Says even when she shakes them out and tries to move them, the tingling does not resolve. Says that it is present when she wakes  up in the morning and it takes about 1 hour for it to resolve. She does not notice the symptoms during the day at other times. Says that she works for [employer]. --for past 1 1/2 years. Says that she is on the computer some but not all day. 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  She also states that she has been feeling pain in both of her elbows recently. Cannot think of any repetitive motion she has done with her arms recently.  Past Medical History  Diagnosis Date  . Diabetes mellitus without complication   . Smoker   . Hyperlipidemia 02/13/2013  . Depression      Home Meds:  Outpatient Prescriptions Prior to Visit  Medication Sig Dispense Refill  . aspirin 81 MG tablet Take 1 tablet (81 mg total) by mouth daily. 30 tablet 11  . canagliflozin (INVOKANA) 300 MG TABS tablet Take 300 mg by mouth daily. 90 tablet 2  . LANTUS SOLOSTAR 100 UNIT/ML Solostar Pen INJECT 100 UNITS SUBCUTANEOUSLY ONCE DAILY AT  10PM 30 mL 3  . metFORMIN (GLUCOPHAGE) 1000 MG tablet TAKE ONE TABLET BY MOUTH TWICE DAILY WITH MEAL 180 tablet 1  . Multiple Vitamin (MULTIVITAMIN) tablet Take 1 tablet by mouth daily.    . ramipril (ALTACE) 2.5 MG capsule Take 1 capsule (2.5 mg total) by mouth daily. 90 capsule 2  . simvastatin (ZOCOR) 20 MG tablet TAKE ONE TABLET BY MOUTH AT BEDTIME 90 tablet 0   No facility-administered medications prior to  visit.     Allergies: No Known Allergies  Social History   Social History  . Marital Status: Married    Spouse Name: N/A  . Number of Children: N/A  . Years of Education: N/A   Occupational History  . Not on file.   Social History Main Topics  . Smoking status: Former Smoker -- 0.50 packs/day    Types: Cigarettes    Quit date: 01/31/2014  . Smokeless tobacco: Never Used     Comment: has been trying to quit  . Alcohol Use: No  . Drug Use: No  . Sexual Activity: Yes    Birth Control/ Protection: None   Other Topics Concern  . Not on file   Social History Narrative    Family History  Problem Relation Age of Onset  . Diabetes Father   . Cancer Neg Hx   . Heart disease Neg Hx      Review of Systems:  See HPI for pertinent ROS. All other ROS negative.    Physical Exam: Blood pressure 100/68, pulse 82, temperature 98.1 F (36.7 C), temperature source Oral, resp. rate 18, weight 210 lb (95.255 kg)., Body mass index is 34.16 kg/(m^2). General: Obese WF. Appears in no acute distress. Neck: Supple. No thyromegaly. No lymphadenopathy. No carotid bruits. Lungs: Clear bilaterally to auscultation without wheezes, rales, or rhonchi. Breathing is unlabored. Heart: RRR with S1 S2. No murmurs, rubs, or gallops. Abdomen: Soft, non-tender, non-distended with normoactive bowel sounds. No hepatomegaly. No rebound/guarding. No obvious abdominal masses. Musculoskeletal:  Strength and tone normal for age. Bilateral Tinels: Negative Bilateral Phalens: Positive. This illicits paresthesias in thumbs, index fingers, middle fingers, 4th fingers bilaterally. She says that this reproduces the symptoms that she has been feeling in her fingers when she wakes up in the morning. Bilateral lateral epicondyles are very tender with palpation and she says that this reproduces the symptoms that she has been feeling there.  Extremities/Skin: Warm and dry.  No edema.  Neuro: Alert and oriented X 3.  Moves all extremities spontaneously. Gait is normal. CNII-XII grossly in tact. Psych:  Responds to questions appropriately with a normal affect. Diabetic foot exam: inspection is normal. 2+ bilateral dorsalis pedis and posterior tibial pulses. Sensation is intact bilaterally.     ASSESSMENT AND PLAN:  44 y.o. year old female with  1. Diabetes mellitus without complication  Recent  hemoglobin A1c s have been excellent.  At visit 07/02/14 discussed with her that at this point our biggest concern is going to be making sure she does not have low blood sugars. Says that the lowest she has gotten is 93 and that has been early morning fasting.  - Hemoglobin A1c  MicroAlbumin---  07/02/14  She is on statin. On ACE inhibitor. Added Altace 2.5 mg daily at [place] 05/23/13--she states that she is taking this and is having no adverse effects. On aspirin 81 mg daily--Added at [facility] 05/23/13--she states that she is taking this daily and is having no bleeding.  Pneumovax 23--given here 05/23/13  She reports that she did have a diabetic eye exam January 2015. Had another routine diabetic eye exam 03/27/14. Since then, has been following up with retina specialist and getting injections in both eyes every 4-6 weeks.  At office visit 03/28/14 she states that she has an eye appointment later today. She says that her fianc who has diabetes as having a lot of problems with his eyes. She says that when she went for her eye exam last year they told her they did see some problems with her retina and told her to follow up with a retina specialist but she never did.  She is aware of proper foot care.   2. Hyperlipidemia She is on simvastatin. At lipid panel 07/02/2014--excellent---LDL 61  3. Smoker She is NOT smoking!!!   I discussed her scheduling a complete physical exam and multiple visits and she never has come back in for this. Update as much preventive care as I can other than her pelvic exam and Pap  smear.  Mammogram: AT [facility] 03/27/2014-- She states she has never had mammogram. She does report that her maternal grandmother had breast cancer. Thinks was diagnosed around age 13 and she died at around age 1. And is agreeable for me to schedule a mammogram. AT [facility] 07/02/14--- she reports that she did have her mammogram and it was negative.  Colorectal Cancer Screening: She has no indicatin to need this until age 8  DEXA: Can wait until closer to age 59 to start this  Immunizations: T dap----------- given here 03/27/14 Pneumovax---- given here 05/23/13 Influenza vaccine--- she  received for 2015-2016 flu season.  Marin Olp [place], [place], [facility] 10/01/2014 8:28 AM

## 2014-10-03 ENCOUNTER — Encounter: Payer: Self-pay | Admitting: Family Medicine

## 2014-10-16 ENCOUNTER — Encounter: Payer: Self-pay | Admitting: Physician Assistant

## 2014-10-21 ENCOUNTER — Encounter: Payer: Self-pay | Admitting: Physician Assistant

## 2014-11-06 ENCOUNTER — Other Ambulatory Visit: Payer: Self-pay | Admitting: Physician Assistant

## 2014-11-06 NOTE — Telephone Encounter (Signed)
Medication refilled per protocol. 

## 2014-11-18 ENCOUNTER — Encounter: Payer: Self-pay | Admitting: Physician Assistant

## 2014-12-31 ENCOUNTER — Ambulatory Visit: Payer: BLUE CROSS/BLUE SHIELD | Admitting: Physician Assistant

## 2015-01-06 ENCOUNTER — Other Ambulatory Visit: Payer: Self-pay | Admitting: Physician Assistant

## 2015-01-07 NOTE — Telephone Encounter (Signed)
Medication refilled per protocol. 

## 2015-01-14 ENCOUNTER — Encounter: Payer: Self-pay | Admitting: Physician Assistant

## 2015-01-14 ENCOUNTER — Ambulatory Visit (INDEPENDENT_AMBULATORY_CARE_PROVIDER_SITE_OTHER): Payer: BLUE CROSS/BLUE SHIELD | Admitting: Physician Assistant

## 2015-01-14 VITALS — BP 104/66 | HR 96 | Temp 98.3°F | Resp 18 | Wt 212.0 lb

## 2015-01-14 DIAGNOSIS — E785 Hyperlipidemia, unspecified: Secondary | ICD-10-CM

## 2015-01-14 DIAGNOSIS — E119 Type 2 diabetes mellitus without complications: Secondary | ICD-10-CM | POA: Diagnosis not present

## 2015-01-14 DIAGNOSIS — Z720 Tobacco use: Secondary | ICD-10-CM | POA: Diagnosis not present

## 2015-01-14 DIAGNOSIS — F17200 Nicotine dependence, unspecified, uncomplicated: Secondary | ICD-10-CM

## 2015-01-14 MED ORDER — VARENICLINE TARTRATE 0.5 MG PO TABS: 0.5000 mg | ORAL_TABLET | Freq: Two times a day (BID) | ORAL | Status: DC

## 2015-01-14 MED ORDER — VARENICLINE TARTRATE 1 MG PO TABS: 1.0000 mg | ORAL_TABLET | Freq: Two times a day (BID) | ORAL | Status: DC

## 2015-01-14 NOTE — Progress Notes (Signed)
Patient ID: Mckenzie Tyler MRN: [id number], DOB: [birth year], 44 y.o. Date of Encounter: @DATE @  Chief Complaint:  Chief Complaint  Patient presents with  . routine check up    has been having chest/left shoulder discomfort x 1 1/2 weeks  . refill chantix    HPI: 44 y.o. year old white female  presents for routine followup.  She saw me for an office visit 11/22/12. Prior to that visit her last visit with our office had been August 2011. She had also reported in the past that she was seeing Dr. Isabel Caprice for her diabetes. She came to see me 11/22/12 asked her about this. She stated that she had gotten frustrated because she felt like she kept adjusting medications for her blood sugar but they were never getting control. Therefore she ended up stopping all of her medications and stopped all followup around 2010. She reported that back then she was on insulin 70/30 at 30 units twice a day. She had been using no diabetic treatment since 2010. Asked what prompted her to come in to restart treatment 11/22/12. She stated that her fianc recently had dorsum of the amputation secondary to uncontrolled diabetes.  At visit 11/22/12 A1c was 9.9. Started Lantus at that time. Plan to check labs and creatinine normal at metformin.  She had followup with me 12/06/12. Metformin was added. She is then to continue to titrate her Lantus.  At [place] 01/2013 she reported that she was able to titrate the metformin up to 1000 mg twice a day. She was having no adverse effects and was tolerating this well.  At office visit January 2015 we added Invokana. She said that this made a huge difference in her blood sugars. Says it definitely decreasing quite a bit.  At  Visit 10/14 we checked FLP LFT. Recommended starting simvastatin 10 mg. She states that she is taking this as directed. She is having no myalgias or other adverse effects.   Also at her visit 11/25/2012 she reported that she did use Chantix in the past and  it worked very well for her. She has stayed clear for 2-3 years but then back to smoking about 1-1/2 years ago.  At that visit I gave her another prescription of Chantix. At [place] 1/15 she stated that she finished the Chantix 2 weeks ago and has not smoked any for 71 days.  Said that she has no cravings to smoke. Said she had even been around smokers at times and has had no cravings. At [place] 05/23/13 she reports she has continued to stay off of the cigarettes and still is having absolutely no cravings. !!!!  Unfortunately, at [place] 12/2013--she says that she had quit smoking 12/01/2012. Says that she had gone to almost her one-year mark but then over Halloween weekend she went to a party. Smoked at that party. Bought a pack of cigarettes that following week. Has been smoking for these past couple of weeks. Wants to get back on Chantix so that she can stop smoking again. At [place] 12/18/2013--prescribed Chantix. At [place] 03/27/2014--says that she is still taking the Chantix. Says that she is off of cigarettes.  Says that even with the coupon the Chantix is very expensive. So says that she has gained 7 pounds since her last visit and thinks that a lot of that has to do with munching in place of smoking.  At [place] 10/01/2014--- says that she has stayed off of the cigarettes and is not smoking at all. Says  that she is trying to stay away from her friends that do smoke.   She does no exercise. SHe tries to be compliant with her diet. Says She actually has gotten to where she would prefer to eat an apple or carrots for snacks. She works at [employer] and has to work 12 hour shifts 3 days a week.  At [place] 10/01/2014--- she says that she has been using something called [employer]--says that she orders this on line. Says that it is a lot of protein. She drinks a shake in the morning and a shake around noon and then eats dinner meal. Says that she eats healthy snacks between so that she is taking in something about every 3 hours. Eats  something like carrots with hummus. Drinks 1 gallon of water a day.  At office visit 03/28/14 she states that she has an eye appointment later today. She says that her fianc who has diabetes as having a lot of problems with his eyes. She says that when she went for her eye exam last year they told her they did see some problems with her retina and told her to follow up with a retina specialist but she never did.  12/2013--A1c came back 6.4. On that lab results were told her to make sure she was getting no low readings. She says that she was getting some low readings in the night so she has decreased her Lantus dose since then. Says that she recently has not been checking her blood sugar on a daily basis anymore.  07/02/2014--she says that when she went to the eye appointment 03/27/14. Since then went to a retina specialist. Was given bilateral eye injections--For diabetic retinopathy. Has to repeat these injections every 4-6 weeks. Says that she is not smoking at all. Completed her Chantix 1 week ago. Says that she has gotten to the point that she wants to change her life and make lifestyle changes. Has been using juice cleanses and feels so much better with this. Is not eating the foods that she used to in the past anymore. Says that she has been able to decrease her Lantus from 100 down to 60 units. Says that the lowest sugar she gets is about 93--and that is fasting in the morning. No complaints or concerns at this visit.  At [place] 10/01/2014---  She says that she did "2 series of injections to the eye". Says that when she completed this, the Retina Specialist told her that they did not think she would need any further injections. Says she goes for follow-up in October. Says that she has remained off of cigarettes. Says that she is using [employer] which is something she ordered on line. It is high protein. She drinks one shake in the morning, 1 shake in the afternoon, and then eats a dinner meal. Eats  healthy snacks between such as hummus and carrots. Drinks 1 gallon of water per day. Says that her Lantus is still at 60 units--- but that if she knows she has eaten very low amount of carbs, then she may just use Lantus 50 units but if she eats spaghetti or something then she will use 60.  At [place] 10/01/2014:  She reports that on both hands:  her thumb and all fingers except for her pinky fingers: These feel numb and tingly when she wakes up in the morning. Says even when she shakes them out and tries to move them, the tingling does not resolve. Says that it is present when  she wakes up in the morning and it takes about 1 hour for it to resolve. She does not notice the symptoms during the day at other times. Says that she works for [employer]. --for past 1 1/2 years. Says that she is on the computer some but not all day. 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  She also states that she has been feeling pain in both of her elbows recently. Cannot think of any repetitive motion she has done with her arms recently.  At [place] 01/14/2015: She states that she started back smoking right after Thanksgiving. She was going to parties and around friends who were smoking. However says that she got a new car so she is not smoking in the car. Is only smoking 2 or 3 cigarettes per day. Wants to get back on Chantix so she can quit completely.  Says that she recently had pain in her left shoulder blade. Says that she has applied muscle rub to that has gotten better. Says also she has had some pain up in the left neck. Says now her left anterior chest sore with palpation. Says that she is left-handed. Says that she has been getting out Christmas stuff and moving big boxes etc.  Says that she checks blood sugar every morning. Says this morning was 106. Says the highest has been 140 and it just depends on what she ate the night before.  She is taking her statin as directed. No myalgias or other adverse  effects.   Past Medical History  Diagnosis Date  . Diabetes mellitus without complication ([place])   . Smoker   . Hyperlipidemia 02/13/2013  . Depression      Home Meds:  Outpatient Prescriptions Prior to Visit  Medication Sig Dispense Refill  . aspirin 81 MG tablet Take 1 tablet (81 mg total) by mouth daily. 30 tablet 11  . canagliflozin (INVOKANA) 300 MG TABS tablet Take 300 mg by mouth daily. 90 tablet 2  . LANTUS SOLOSTAR 100 UNIT/ML Solostar Pen INJECT 100 UNITS SUBCUTANEOUSLY ONCE DAILY AT 10 PM 30 pen 5  . metFORMIN (GLUCOPHAGE) 1000 MG tablet TAKE ONE TABLET BY MOUTH TWICE DAILY WITH MEAL 180 tablet 1  . Multiple Vitamin (MULTIVITAMIN) tablet Take 1 tablet by mouth daily.    . ramipril (ALTACE) 2.5 MG capsule Take 1 capsule (2.5 mg total) by mouth daily. 90 capsule 2  . simvastatin (ZOCOR) 20 MG tablet TAKE ONE TABLET BY MOUTH AT BEDTIME 90 tablet 0   No facility-administered medications prior to visit.     Allergies: No Known Allergies  Social History   Social History  . Marital Status: Married    Spouse Name: N/A  . Number of Children: N/A  . Years of Education: N/A   Occupational History  . Not on file.   Social History Main Topics  . Smoking status: Current Every Day Smoker -- 0.25 packs/day    Types: Cigarettes  . Smokeless tobacco: Never Used     Comment: has been trying to quit  . Alcohol Use: No  . Drug Use: No  . Sexual Activity: Yes    Birth Control/ Protection: None   Other Topics Concern  . Not on file   Social History Narrative    Family History  Problem Relation Age of Onset  . Diabetes Father   . Cancer Neg Hx   . Heart disease Neg Hx      Review of Systems:  See HPI for  pertinent ROS. All other ROS negative.    Physical Exam: Blood pressure 104/66, pulse 96, temperature 98.3 F (36.8 C), temperature source Oral, resp. rate 18, weight 212 lb (96.163 kg)., Body mass index is 34.48 kg/(m^2). General: Obese WF. Appears in no acute  distress. Neck: Supple. No thyromegaly. No lymphadenopathy. No carotid bruits. Lungs: Clear bilaterally to auscultation without wheezes, rales, or rhonchi. Breathing is unlabored. Heart: RRR with S1 S2. No murmurs, rubs, or gallops. Abdomen: Soft, non-tender, non-distended with normoactive bowel sounds. No hepatomegaly. No rebound/guarding. No obvious abdominal masses. Musculoskeletal:  Strength and tone normal for age. Positive pain with palpation of the left scapular region the left neck and left chest wall. Extremities/Skin: Warm and dry.  No edema.  Neuro: Alert and oriented X 3. Moves all extremities spontaneously. Gait is normal. CNII-XII grossly in tact. Psych:  Responds to questions appropriately with a normal affect. Diabetic foot exam: inspection is normal. 2+ bilateral dorsalis pedis and posterior tibial pulses. Sensation is intact bilaterally.     ASSESSMENT AND PLAN:  44 y.o. year old female with  1. Diabetes mellitus without complication  Recent  hemoglobin A1c s have been excellent.  At visit 07/02/14 discussed with her that at this point our biggest concern is going to be making sure she does not have low blood sugars. Says that the lowest she has gotten is 93 and that has been early morning fasting.  - Hemoglobin A1c  MicroAlbumin---  07/02/14  She is on statin. On ACE inhibitor. Added Altace 2.5 mg daily at [place] 05/23/13--she states that she is taking this and is having no adverse effects. On aspirin 81 mg daily--Added at [facility] 05/23/13--she states that she is taking this daily and is having no bleeding.  Pneumovax 23--given here 05/23/13  She reports that she did have a diabetic eye exam January 2015. Had another routine diabetic eye exam 03/27/14. Since then, has been following up with retina specialist and getting injections in both eyes every 4-6 weeks.  At office visit 03/28/14 she states that she has an eye appointment later today. She says that her fianc who has  diabetes as having a lot of problems with his eyes. She says that when she went for her eye exam last year they told her they did see some problems with her retina and told her to follow up with a retina specialist but she never did.  She is aware of proper foot care.   2. Hyperlipidemia She is on simvastatin. Lipid panel 07/02/2014--excellent---LDL 61 Not fasting today-- will check CMET --Can wait to repeat lipid panel.  3. Smoker At visit 01/14/15 I sent in another prescription for the Chantix starting pack and then also the continuing Dosepaks with some refills. She has used this in the past with good success and with no adverse effects.  I discussed her scheduling a complete physical exam and multiple visits and she never has come back in for this. Update as much preventive care as I can other than her pelvic exam and Pap smear.  At visit 01/14/15 reassured her that the pain she is experiencing in the left scapula left neck and left chest are musculoskeletal. She apply heat to the region range of motion of the shoulder and neck frequently throughout the day and discussed stretches to do as well.  Mammogram: AT [facility] 03/27/2014-- She states she has never had mammogram. She does report that her maternal grandmother had breast cancer. Thinks was diagnosed around age 46 and she  died at around age 88. And is agreeable for me to schedule a mammogram. AT [facility] 07/02/14--- she reports that she did have her mammogram and it was negative.  Colorectal Cancer Screening: She has no indicatin to need this until age 65  DEXA: Can wait until closer to age 84 to start this  Immunizations: T dap----------- given here 03/27/14 Pneumovax---- given here 05/23/13 Influenza vaccine--- she  received for 2015-2016 flu season.  [address] [place], [place], [facility] 01/14/2015 4:06 PM

## 2015-01-15 LAB — COMPLETE METABOLIC PANEL WITH GFR
ALBUMIN: 4.4 g/dL (ref 3.6–5.1)
ALT: 50 U/L — ABNORMAL HIGH (ref 6–29)
AST: 30 U/L (ref 10–35)
Alkaline Phosphatase: 38 U/L (ref 33–115)
BUN: 20 mg/dL (ref 7–25)
CO2: 28 mmol/L (ref 20–31)
Calcium: 9.8 mg/dL (ref 8.6–10.2)
Chloride: 101 mmol/L (ref 98–110)
Creat: 0.71 mg/dL (ref 0.50–1.10)
GLUCOSE: 166 mg/dL — AB (ref 70–99)
Potassium: 4.2 mmol/L (ref 3.5–5.3)
SODIUM: 138 mmol/L (ref 135–146)
TOTAL PROTEIN: 6.7 g/dL (ref 6.1–8.1)
Total Bilirubin: 0.5 mg/dL (ref 0.2–1.2)

## 2015-01-15 LAB — HEMOGLOBIN A1C
Hgb A1c MFr Bld: 7.5 % — ABNORMAL HIGH (ref <5.7)
Mean Plasma Glucose: 169 mg/dL — ABNORMAL HIGH (ref <117)

## 2015-01-21 ENCOUNTER — Other Ambulatory Visit: Payer: Self-pay | Admitting: Family Medicine

## 2015-01-21 MED ORDER — METFORMIN HCL 1000 MG PO TABS: ORAL_TABLET | ORAL | Status: DC

## 2015-01-21 NOTE — Telephone Encounter (Signed)
Medication refilled per protocol. 

## 2015-03-30 ENCOUNTER — Other Ambulatory Visit: Payer: Self-pay | Admitting: Family Medicine

## 2015-03-30 MED ORDER — SIMVASTATIN 20 MG PO TABS: 20.0000 mg | ORAL_TABLET | Freq: Every day | ORAL | Status: DC

## 2015-03-30 NOTE — Telephone Encounter (Signed)
Medication refilled per protocol. 

## 2015-04-15 ENCOUNTER — Encounter: Payer: Self-pay | Admitting: Physician Assistant

## 2015-04-15 ENCOUNTER — Ambulatory Visit (INDEPENDENT_AMBULATORY_CARE_PROVIDER_SITE_OTHER): Payer: BLUE CROSS/BLUE SHIELD | Admitting: Physician Assistant

## 2015-04-15 VITALS — BP 124/74 | HR 100 | Temp 98.1°F | Resp 18 | Wt 210.0 lb

## 2015-04-15 DIAGNOSIS — E785 Hyperlipidemia, unspecified: Secondary | ICD-10-CM | POA: Diagnosis not present

## 2015-04-15 DIAGNOSIS — F17200 Nicotine dependence, unspecified, uncomplicated: Secondary | ICD-10-CM

## 2015-04-15 DIAGNOSIS — Z720 Tobacco use: Secondary | ICD-10-CM | POA: Diagnosis not present

## 2015-04-15 DIAGNOSIS — E119 Type 2 diabetes mellitus without complications: Secondary | ICD-10-CM

## 2015-04-15 LAB — HEMOGLOBIN A1C, FINGERSTICK: HEMOGLOBIN A1C, FINGERSTICK: 7.2 % — AB (ref <5.7)

## 2015-04-15 MED ORDER — BUPROPION HCL ER (SR) 150 MG PO TB12: ORAL_TABLET | ORAL | Status: DC

## 2015-04-15 NOTE — Progress Notes (Signed)
Patient ID: Mckenzie Tyler MRN: [id number], DOB: 1970/06/28, 45 y.o. Date of Encounter: @DATE @  Chief Complaint:  Chief Complaint  Patient presents with  . 3 mth check up    HPI: 45 y.o. year old white female  presents for routine followup.  She saw me for an office visit 11/22/12. Prior to that visit her last visit with our office had been August 2011. She had also reported in the past that she was seeing Dr. Isabel Caprice for her diabetes. She came to see me 11/22/12 asked her about this. She stated that she had gotten frustrated because she felt like she kept adjusting medications for her blood sugar but they were never getting control. Therefore she ended up stopping all of her medications and stopped all followup around 2010. She reported that back then she was on insulin 70/30 at 30 units twice a day. She had been using no diabetic treatment since 2010. Asked what prompted her to come in to restart treatment 11/22/12. She stated that her fianc recently had dorsum of the amputation secondary to uncontrolled diabetes.  At visit 11/22/12 A1c was 9.9. Started Lantus at that time. Plan to check labs and creatinine normal at metformin.  She had followup with me 12/06/12. Metformin was added. She is then to continue to titrate her Lantus.  At [place] 01/2013 she reported that she was able to titrate the metformin up to 1000 mg twice a day. She was having no adverse effects and was tolerating this well.  At office visit January 2015 we added Invokana. She said that this made a huge difference in her blood sugars. Says it definitely decreasing quite a bit.  At  Visit 10/14 we checked FLP LFT. Recommended starting simvastatin 10 mg. She states that she is taking this as directed. She is having no myalgias or other adverse effects.   Also at her visit 11/25/2012 she reported that she did use Chantix in the past and it worked very well for her. She has stayed clear for 2-3 years but then back to  smoking about 1-1/2 years ago.  At that visit I gave her another prescription of Chantix. At [place] 1/15 she stated that she finished the Chantix 2 weeks ago and has not smoked any for 71 days.  Said that she has no cravings to smoke. Said she had even been around smokers at times and has had no cravings. At [place] 05/23/13 she reports she has continued to stay off of the cigarettes and still is having absolutely no cravings. !!!!  Unfortunately, at [place] 12/2013--she says that she had quit smoking 12/01/2012. Says that she had gone to almost her one-year mark but then over Halloween weekend she went to a party. Smoked at that party. Bought a pack of cigarettes that following week. Has been smoking for these past couple of weeks. Wants to get back on Chantix so that she can stop smoking again. At [place] 12/18/2013--prescribed Chantix. At [place] 03/27/2014--says that she is still taking the Chantix. Says that she is off of cigarettes.  Says that even with the coupon the Chantix is very expensive. So says that she has gained 7 pounds since her last visit and thinks that a lot of that has to do with munching in place of smoking.  At [place] 10/01/2014--- says that she has stayed off of the cigarettes and is not smoking at all. Says that she is trying to stay away from her friends that do smoke.   She  does no exercise. SHe tries to be compliant with her diet. Says She actually has gotten to where she would prefer to eat an apple or carrots for snacks. She works at [employer] and has to work 12 hour shifts 3 days a week.  At [place] 10/01/2014--- she says that she has been using something called [employer]--says that she orders this on line. Says that it is a lot of protein. She drinks a shake in the morning and a shake around noon and then eats dinner meal. Says that she eats healthy snacks between so that she is taking in something about every 3 hours. Eats something like carrots with hummus. Drinks 1 gallon of water a day.  At office  visit 03/28/14 she states that she has an eye appointment later today. She says that her fianc who has diabetes as having a lot of problems with his eyes. She says that when she went for her eye exam last year they told her they did see some problems with her retina and told her to follow up with a retina specialist but she never did.  12/2013--A1c came back 6.4. On that lab results were told her to make sure she was getting no low readings. She says that she was getting some low readings in the night so she has decreased her Lantus dose since then. Says that she recently has not been checking her blood sugar on a daily basis anymore.  07/02/2014--she says that when she went to the eye appointment 03/27/14. Since then went to a retina specialist. Was given bilateral eye injections--For diabetic retinopathy. Has to repeat these injections every 4-6 weeks. Says that she is not smoking at all. Completed her Chantix 1 week ago. Says that she has gotten to the point that she wants to change her life and make lifestyle changes. Has been using juice cleanses and feels so much better with this. Is not eating the foods that she used to in the past anymore. Says that she has been able to decrease her Lantus from 100 down to 60 units. Says that the lowest sugar she gets is about 93--and that is fasting in the morning. No complaints or concerns at this visit.  At [place] 10/01/2014---  She says that she did "2 series of injections to the eye". Says that when she completed this, the Retina Specialist told her that they did not think she would need any further injections. Says she goes for follow-up in October. Says that she has remained off of cigarettes. Says that she is using [employer] which is something she ordered on line. It is high protein. She drinks one shake in the morning, 1 shake in the afternoon, and then eats a dinner meal. Eats healthy snacks between such as hummus and carrots. Drinks 1 gallon of water per  day. Says that her Lantus is still at 60 units--- but that if she knows she has eaten very low amount of carbs, then she may just use Lantus 50 units but if she eats spaghetti or something then she will use 60.  At [place] 10/01/2014:  She reports that on both hands:  her thumb and all fingers except for her pinky fingers: These feel numb and tingly when she wakes up in the morning. Says even when she shakes them out and tries to move them, the tingling does not resolve. Says that it is present when she wakes up in the morning and it takes about 1 hour for it to resolve.  She does not notice the symptoms during the day at other times. Says that she works for [employer]. --for past 1 1/2 years. Says that she is on the computer some but not all day. 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  She also states that she has been feeling pain in both of her elbows recently. Cannot think of any repetitive motion she has done with her arms recently.  At [place] 01/14/2015: She states that she started back smoking right after Thanksgiving. She was going to parties and around friends who were smoking. However says that she got a new car so she is not smoking in the car. Is only smoking 2 or 3 cigarettes per day. Wants to get back on Chantix so she can quit completely.  Says that she recently had pain in her left shoulder blade. Says that she has applied muscle rub to that has gotten better. Says also she has had some pain up in the left neck. Says now her left anterior chest sore with palpation. Says that she is left-handed. Says that she has been getting out Christmas stuff and moving big boxes etc.  Says that she checks blood sugar every morning. Says this morning was 106. Says the highest has been 140 and it just depends on what she ate the night before.  She is taking her statin as directed. No myalgias or other adverse effects.  At [place] 04/15/2015: She reports that the savings card/coupon ran out on her  Chantix as well as Invokana,. She reports that without the savings cards both of these were very expensive. Therefore she has stopped both of these medications. Says that she is smoking about 6-7 cigarettes per day. Says that even without the invokana, her fasting blood sugars are 110-120. Says that she has not had to increase her insulin and has cut that dose the same. States that her boyfriend is having to have bypass surgery--- says they have been eating extremely healthy--- says this is probably helping regarding keeping her sugars down despite stopping the Invokana.  She states that with her job she is walking constantly all day. Sometimes has to carry boxes up steps. Is having no chest pressure heaviness tightness or squeezing in no increased shortness of breath with exertion. Has not had any follow-up with eye doctor since last visit. No other complaints or concerns or updates.    Past Medical History  Diagnosis Date  . Diabetes mellitus without complication ([place])   . Smoker   . Hyperlipidemia 02/13/2013  . Depression      Home Meds:  Outpatient Prescriptions Prior to Visit  Medication Sig Dispense Refill  . aspirin 81 MG tablet Take 1 tablet (81 mg total) by mouth daily. 30 tablet 11  . LANTUS SOLOSTAR 100 UNIT/ML Solostar Pen INJECT 100 UNITS SUBCUTANEOUSLY ONCE DAILY AT 10 PM 30 pen 5  . metFORMIN (GLUCOPHAGE) 1000 MG tablet TAKE ONE TABLET BY MOUTH TWICE DAILY WITH MEAL 180 tablet 1  . Multiple Vitamin (MULTIVITAMIN) tablet Take 1 tablet by mouth daily.    . ramipril (ALTACE) 2.5 MG capsule Take 1 capsule (2.5 mg total) by mouth daily. 90 capsule 2  . simvastatin (ZOCOR) 20 MG tablet Take 1 tablet (20 mg total) by mouth at bedtime. 90 tablet 0  . canagliflozin (INVOKANA) 300 MG TABS tablet Take 300 mg by mouth daily. (Patient not taking: Reported on 04/15/2015) 90 tablet 2  . varenicline (CHANTIX CONTINUING MONTH PAK) 1 MG tablet Take  1 tablet (1 mg total) by mouth 2 (two)  times daily. (Patient not taking: Reported on 04/15/2015) 60 tablet 4  . varenicline (CHANTIX) 0.5 MG tablet Take 1 tablet (0.5 mg total) by mouth 2 (two) times daily. (Patient not taking: Reported on 04/15/2015) 60 tablet 0   No facility-administered medications prior to visit.     Allergies: No Known Allergies  Social History   Social History  . Marital Status: Married    Spouse Name: N/A  . Number of Children: N/A  . Years of Education: N/A   Occupational History  . Not on file.   Social History Main Topics  . Smoking status: Current Every Day Smoker -- 0.25 packs/day    Types: Cigarettes  . Smokeless tobacco: Never Used     Comment: has been trying to quit  . Alcohol Use: No  . Drug Use: No  . Sexual Activity: Yes    Birth Control/ Protection: None   Other Topics Concern  . Not on file   Social History Narrative    Family History  Problem Relation Age of Onset  . Diabetes Father   . Cancer Neg Hx   . Heart disease Neg Hx      Review of Systems:  See HPI for pertinent ROS. All other ROS negative.    Physical Exam: Blood pressure 124/74, pulse 100, temperature 98.1 F (36.7 C), temperature source Oral, resp. rate 18, weight 210 lb (95.255 kg), last menstrual period 04/25/2013., Body mass index is 34.16 kg/(m^2). General: Obese WF. Appears in no acute distress. Neck: Supple. No thyromegaly. No lymphadenopathy. No carotid bruits. Lungs: Clear bilaterally to auscultation without wheezes, rales, or rhonchi. Breathing is unlabored. Heart: RRR with S1 S2. No murmurs, rubs, or gallops. Abdomen: Soft, non-tender, non-distended with normoactive bowel sounds. No hepatomegaly. No rebound/guarding. No obvious abdominal masses. Musculoskeletal:  Strength and tone normal for age. Positive pain with palpation of the left scapular region the left neck and left chest wall. Extremities/Skin: Warm and dry.  No edema.  Neuro: Alert and oriented X 3. Moves all extremities  spontaneously. Gait is normal. CNII-XII grossly in tact. Psych:  Responds to questions appropriately with a normal affect. Diabetic foot exam: inspection is normal. 2+ bilateral dorsalis pedis and posterior tibial pulses. Sensation is intact bilaterally.     ASSESSMENT AND PLAN:  45 y.o. year old female with  1. Diabetes mellitus without complication  - Hemoglobin A1c  MicroAlbumin---  07/02/14  She is on statin. On ACE inhibitor. Added Altace 2.5 mg daily at [place] 05/23/13--she states that she is taking this and is having no adverse effects. On aspirin 81 mg daily--Added at [facility] 05/23/13--she states that she is taking this daily and is having no bleeding.  Pneumovax 23--given here 05/23/13  She reports that she did have a diabetic eye exam January 2015. Had another routine diabetic eye exam 03/27/14. Since then, has been following up with retina specialist and getting injections in both eyes every 4-6 weeks.  At office visit 03/28/14 she states that she has an eye appointment later today. She says that her fianc who has diabetes as having a lot of problems with his eyes. She says that when she went for her eye exam last year they told her they did see some problems with her retina and told her to follow up with a retina specialist but she never did.  She is aware of proper foot care.   2. Hyperlipidemia She is on simvastatin. Lipid  panel 07/02/2014--excellent---LDL 61 Not fasting today-- will check CMET --Can wait to repeat lipid panel.  3. Smoker At visit 04/15/15 discussed using Wellbutrin to help with smoking cessation. She states that she has never used this but is agreeable to try this. She is to take 1 daily for the first 5 days then increase to 1 twice daily. If any adverse effects and stop the medicine and call us.    I discussed her scheduling a complete physical exam and multiple visits and she never has come back in for this. Update as much preventive care as I can other than her  pelvic exam and Pap smear.  At visit 01/14/15 reassured her that the pain she is experiencing in the left scapula left neck and left chest are musculoskeletal. She apply heat to the region range of motion of the shoulder and neck frequently throughout the day and discussed stretches to do as well.  Mammogram: AT [facility] 03/27/2014-- She states she has never had mammogram. She does report that her maternal grandmother had breast cancer. Thinks was diagnosed around age 101 and she died at around age 56. And is agreeable for me to schedule a mammogram. AT [facility] 07/02/14--- she reports that she did have her mammogram and it was negative.  Colorectal Cancer Screening: She has no indicatin to need this until age 46  DEXA: Can wait until closer to age 72 to start this  Immunizations: T dap----------- given here 03/27/14 Pneumovax---- given here 05/23/13 Influenza vaccine--- she  received for 2015-2016 flu season.  [address] [place], [place], [facility] 04/15/2015 4:12 PM

## 2015-04-24 ENCOUNTER — Other Ambulatory Visit: Payer: Self-pay | Admitting: Physician Assistant

## 2015-04-24 NOTE — Telephone Encounter (Signed)
Refill appropriate and filled per protocol. 

## 2015-04-29 ENCOUNTER — Encounter: Payer: Self-pay | Admitting: Physician Assistant

## 2015-07-16 ENCOUNTER — Ambulatory Visit (INDEPENDENT_AMBULATORY_CARE_PROVIDER_SITE_OTHER): Payer: BLUE CROSS/BLUE SHIELD | Admitting: Physician Assistant

## 2015-07-16 ENCOUNTER — Encounter: Payer: Self-pay | Admitting: Physician Assistant

## 2015-07-16 VITALS — BP 126/76 | HR 84 | Temp 98.0°F | Resp 18 | Wt 214.0 lb

## 2015-07-16 DIAGNOSIS — E119 Type 2 diabetes mellitus without complications: Secondary | ICD-10-CM | POA: Diagnosis not present

## 2015-07-16 DIAGNOSIS — Z720 Tobacco use: Secondary | ICD-10-CM

## 2015-07-16 DIAGNOSIS — E785 Hyperlipidemia, unspecified: Secondary | ICD-10-CM | POA: Diagnosis not present

## 2015-07-16 DIAGNOSIS — F17200 Nicotine dependence, unspecified, uncomplicated: Secondary | ICD-10-CM

## 2015-07-17 LAB — COMPLETE METABOLIC PANEL WITH GFR
ALT: 44 U/L — ABNORMAL HIGH (ref 6–29)
AST: 38 U/L — ABNORMAL HIGH (ref 10–35)
Albumin: 4.4 g/dL (ref 3.6–5.1)
Alkaline Phosphatase: 43 U/L (ref 33–115)
BUN: 16 mg/dL (ref 7–25)
CO2: 26 mmol/L (ref 20–31)
Calcium: 9.9 mg/dL (ref 8.6–10.2)
Chloride: 101 mmol/L (ref 98–110)
Creat: 0.73 mg/dL (ref 0.50–1.10)
GFR, Est African American: >89 mL/min (ref >=60)
GFR, Est Non African American: >89 mL/min (ref >=60)
Glucose, Bld: 211 mg/dL — ABNORMAL HIGH (ref 70–99)
Potassium: 4.4 mmol/L (ref 3.5–5.3)
Sodium: 138 mmol/L (ref 135–146)
Total Bilirubin: 0.4 mg/dL (ref 0.2–1.2)
Total Protein: 6.6 g/dL (ref 6.1–8.1)

## 2015-07-17 LAB — MICROALBUMIN, URINE: Microalb, Ur: 2.5 mg/dL — ABNORMAL HIGH

## 2015-07-17 LAB — HEMOGLOBIN A1C
Hgb A1c MFr Bld: 7.2 % — ABNORMAL HIGH (ref <5.7)
Mean Plasma Glucose: 160 mg/dL

## 2015-07-17 NOTE — Progress Notes (Signed)
Patient ID: Azalia Toczek MRN: [id number], DOB: [birth year], 45 y.o. Date of Encounter: @DATE @  Chief Complaint:  Chief Complaint  Patient presents with  . routine check up    HPI: 45 y.o. year old white female  presents for routine followup.  NOTE: At [facility] 07/16/2015 she reported she had kicked out her boyfriend--"he wasn't taking care of himself, couldn't sit there and watch him die"  She saw me for an office visit 11/22/12. Prior to that visit her last visit with our office had been August 2011. She had also reported in the past that she was seeing Dr. Isabel Caprice for her diabetes. She came to see me 11/22/12 asked her about this. She stated that she had gotten frustrated because she felt like she kept adjusting medications for her blood sugar but they were never getting control. Therefore she ended up stopping all of her medications and stopped all followup around 2010. She reported that back then she was on insulin 70/30 at 30 units twice a day. She had been using no diabetic treatment since 2010. Asked what prompted her to come in to restart treatment 11/22/12. She stated that her fianc recently had dorsum of the amputation secondary to uncontrolled diabetes.  At visit 11/22/12 A1c was 9.9. Started Lantus at that time. Plan to check labs and creatinine normal at metformin.  She had followup with me 12/06/12. Metformin was added. She is then to continue to titrate her Lantus.  At [place] 01/2013 she reported that she was able to titrate the metformin up to 1000 mg twice a day. She was having no adverse effects and was tolerating this well.  At office visit January 2015 we added Invokana. She said that this made a huge difference in her blood sugars. Says it definitely decreasing quite a bit.  At  Visit 10/14 we checked FLP LFT. Recommended starting simvastatin 10 mg. She states that she is taking this as directed. She is having no myalgias or other adverse effects.   Also at her visit  11/25/2012 she reported that she did use Chantix in the past and it worked very well for her. She has stayed clear for 2-3 years but then back to smoking about 1-1/2 years ago.  At that visit I gave her another prescription of Chantix. At [place] 1/15 she stated that she finished the Chantix 2 weeks ago and has not smoked any for 71 days.  Said that she has no cravings to smoke. Said she had even been around smokers at times and has had no cravings. At [place] 05/23/13 she reports she has continued to stay off of the cigarettes and still is having absolutely no cravings. !!!!  Unfortunately, at [place] 12/2013--she says that she had quit smoking 12/01/2012. Says that she had gone to almost her one-year mark but then over Halloween weekend she went to a party. Smoked at that party. Bought a pack of cigarettes that following week. Has been smoking for these past couple of weeks. Wants to get back on Chantix so that she can stop smoking again. At [place] 12/18/2013--prescribed Chantix. At [place] 03/27/2014--says that she is still taking the Chantix. Says that she is off of cigarettes.  Says that even with the coupon the Chantix is very expensive. So says that she has gained 7 pounds since her last visit and thinks that a lot of that has to do with munching in place of smoking.  At [place] 10/01/2014--- says that she has stayed off of the  cigarettes and is not smoking at all. Says that she is trying to stay away from her friends that do smoke.   She does no exercise. SHe tries to be compliant with her diet. Says She actually has gotten to where she would prefer to eat an apple or carrots for snacks. She works at [employer] and has to work 12 hour shifts 3 days a week.  At [place] 10/01/2014--- she says that she has been using something called [employer]--says that she orders this on line. Says that it is a lot of protein. She drinks a shake in the morning and a shake around noon and then eats dinner meal. Says that she eats healthy snacks  between so that she is taking in something about every 3 hours. Eats something like carrots with hummus. Drinks 1 gallon of water a day.  At office visit 03/28/14 she states that she has an eye appointment later today. She says that her fianc who has diabetes as having a lot of problems with his eyes. She says that when she went for her eye exam last year they told her they did see some problems with her retina and told her to follow up with a retina specialist but she never did.  12/2013--A1c came back 6.4. On that lab results were told her to make sure she was getting no low readings. She says that she was getting some low readings in the night so she has decreased her Lantus dose since then. Says that she recently has not been checking her blood sugar on a daily basis anymore.  07/02/2014--she says that when she went to the eye appointment 03/27/14. Since then went to a retina specialist. Was given bilateral eye injections--For diabetic retinopathy. Has to repeat these injections every 4-6 weeks. Says that she is not smoking at all. Completed her Chantix 1 week ago. Says that she has gotten to the point that she wants to change her life and make lifestyle changes. Has been using juice cleanses and feels so much better with this. Is not eating the foods that she used to in the past anymore. Says that she has been able to decrease her Lantus from 100 down to 60 units. Says that the lowest sugar she gets is about 93--and that is fasting in the morning. No complaints or concerns at this visit.  At [place] 10/01/2014---  She says that she did "2 series of injections to the eye". Says that when she completed this, the Retina Specialist told her that they did not think she would need any further injections. Says she goes for follow-up in October. Says that she has remained off of cigarettes. Says that she is using [employer] which is something she ordered on line. It is high protein. She drinks one shake in the  morning, 1 shake in the afternoon, and then eats a dinner meal. Eats healthy snacks between such as hummus and carrots. Drinks 1 gallon of water per day. Says that her Lantus is still at 60 units--- but that if she knows she has eaten very low amount of carbs, then she may just use Lantus 50 units but if she eats spaghetti or something then she will use 60.  At [place] 10/01/2014:  She reports that on both hands:  her thumb and all fingers except for her pinky fingers: These feel numb and tingly when she wakes up in the morning. Says even when she shakes them out and tries to move them, the tingling does  not resolve. Says that it is present when she wakes up in the morning and it takes about 1 hour for it to resolve. She does not notice the symptoms during the day at other times. Says that she works for [employer]. --for past 1 1/2 years. Says that she is on the computer some but not all day. 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  She also states that she has been feeling pain in both of her elbows recently. Cannot think of any repetitive motion she has done with her arms recently.  At [place] 01/14/2015: She states that she started back smoking right after Thanksgiving. She was going to parties and around friends who were smoking. However says that she got a new car so she is not smoking in the car. Is only smoking 2 or 3 cigarettes per day. Wants to get back on Chantix so she can quit completely.  Says that she recently had pain in her left shoulder blade. Says that she has applied muscle rub to that has gotten better. Says also she has had some pain up in the left neck. Says now her left anterior chest sore with palpation. Says that she is left-handed. Says that she has been getting out Christmas stuff and moving big boxes etc.  Says that she checks blood sugar every morning. Says this morning was 106. Says the highest has been 140 and it just depends on what she ate the night before.  She is  taking her statin as directed. No myalgias or other adverse effects.  At [place] 04/15/2015: She reports that the savings card/coupon ran out on her Chantix as well as Invokana,. She reports that without the savings cards both of these were very expensive. Therefore she has stopped both of these medications. Says that she is smoking about 6-7 cigarettes per day. Says that even without the invokana, her fasting blood sugars are 110-120. Says that she has not had to increase her insulin and has cut that dose the same. States that her boyfriend is having to have bypass surgery--- says they have been eating extremely healthy--- says this is probably helping regarding keeping her sugars down despite stopping the Invokana.  She states that with her job she is walking constantly all day. Sometimes has to carry boxes up steps. Is having no chest pressure heaviness tightness or squeezing in no increased shortness of breath with exertion. Has not had any follow-up with eye doctor since last visit. No other complaints or concerns or updates.  [facility] 07/16/2015: NOTE: At [facility] 07/16/2015 she reported she had kicked out her boyfriend--"he wasn't taking care of himself, couldn't sit there and watch him die' However, since he has been gone, she has fond herself eating worse. Says she thinks when he was there, she felt like she needed to be "the good example". Now she is only accountable to herself. Also finding it difficult to cook for just one person. However, not getting back with him, she says. She says Wellbutrin has helped a lot with smoking. Currently just smoking 1 -2 cigarettes on weekends with friends. "Is done seeing Retina specialist" Due for regular eye exam in August.  Went to dentist this morning.   Past Medical History  Diagnosis Date  . Diabetes mellitus without complication ([place])   . Smoker   . Hyperlipidemia 02/13/2013  . Depression      Home Meds:  Outpatient Prescriptions Prior to Visit    Medication Sig Dispense Refill  .  aspirin 81 MG tablet Take 1 tablet (81 mg total) by mouth daily. 30 tablet 11  . buPROPion (WELLBUTRIN SR) 150 MG 12 hr tablet Take 1 daily for for 5 days then take 1 twice daily. 60 tablet 3  . LANTUS SOLOSTAR 100 UNIT/ML Solostar Pen INJECT 100 UNITS SUBCUTANEOUSLY ONCE DAILY AT 10 PM 30 pen 5  . metFORMIN (GLUCOPHAGE) 1000 MG tablet TAKE ONE TABLET BY MOUTH TWICE DAILY WITH MEAL 180 tablet 1  . Multiple Vitamin (MULTIVITAMIN) tablet Take 1 tablet by mouth daily.    . ramipril (ALTACE) 2.5 MG capsule TAKE ONE CAPSULE BY MOUTH ONCE DAILY 90 capsule 0  . simvastatin (ZOCOR) 20 MG tablet Take 1 tablet (20 mg total) by mouth at bedtime. 90 tablet 0   No facility-administered medications prior to visit.     Allergies: No Known Allergies  Social History   Social History  . Marital Status: Married    Spouse Name: N/A  . Number of Children: N/A  . Years of Education: N/A   Occupational History  . Not on file.   Social History Main Topics  . Smoking status: Current Every Day Smoker -- 0.25 packs/day    Types: Cigarettes  . Smokeless tobacco: Never Used     Comment: has been trying to quit  . Alcohol Use: No  . Drug Use: No  . Sexual Activity: Yes    Birth Control/ Protection: None   Other Topics Concern  . Not on file   Social History Narrative    Family History  Problem Relation Age of Onset  . Diabetes Father   . Cancer Neg Hx   . Heart disease Neg Hx      Review of Systems:  See HPI for pertinent ROS. All other ROS negative.    Physical Exam: Blood pressure 126/76, pulse 84, temperature 98 F (36.7 C), temperature source Oral, resp. rate 18, weight 214 lb (97.07 kg), last menstrual period 04/25/2013., Body mass index is 34.81 kg/(m^2). General: Obese WF. Appears in no acute distress. Neck: Supple. No thyromegaly. No lymphadenopathy. No carotid bruits. Lungs: Clear bilaterally to auscultation without wheezes, rales, or rhonchi.  Breathing is unlabored. Heart: RRR with S1 S2. No murmurs, rubs, or gallops. Abdomen: Soft, non-tender, non-distended with normoactive bowel sounds. No hepatomegaly. No rebound/guarding. No obvious abdominal masses. Musculoskeletal:  Strength and tone normal for age. Positive pain with palpation of the left scapular region the left neck and left chest wall. Extremities/Skin: Warm and dry.  No edema.  Neuro: Alert and oriented X 3. Moves all extremities spontaneously. Gait is normal. CNII-XII grossly in tact. Psych:  Responds to questions appropriately with a normal affect. Diabetic foot exam: inspection is normal. 2+ bilateral dorsalis pedis and posterior tibial pulses. Sensation is intact bilaterally.     ASSESSMENT AND PLAN:  45 y.o. year old female with  1. Diabetes mellitus without complication NOTE: At [place] 07/16/2015 she reported she had kicked out her boyfriend--"he wasn't taking care of himself, couldn't sit there and watch him die" - Hemoglobin A1c  MicroAlbumin---  07/02/14  She is on statin. On ACE inhibitor. Added Altace 2.5 mg daily at [place] 05/23/13--she states that she is taking this and is having no adverse effects. On aspirin 81 mg daily--Added at [facility] 05/23/13--she states that she is taking this daily and is having no bleeding.  Pneumovax 23--given here 05/23/13  She reports that she did have a diabetic eye exam January 2015. Had another routine diabetic eye exam  03/27/14. Since then, has been following up with retina specialist and getting injections in both eyes every 4-6 weeks.  At office visit 03/28/14 she states that she has an eye appointment later today. She says that her fianc who has diabetes as having a lot of problems with his eyes. She says that when she went for her eye exam last year they told her they did see some problems with her retina and told her to follow up with a retina specialist but she never did.  She is aware of proper foot care.   2.  Hyperlipidemia She is on simvastatin. Lipid panel 07/02/2014--excellent---LDL 61 Not fasting today-- will check CMET --Can wait to repeat lipid panel.  3. Smoker At visit 04/15/15 discussed using Wellbutrin to help with smoking cessation. She states that she has never used this but is agreeable to try this. She is to take 1 daily for the first 5 days then increase to 1 twice daily. If any adverse effects and stop the medicine and call us. At [place] 07/16/15--says wellbutrin has really helped--only smoking 1-2 cigarettes on weekends   I discussed her scheduling a complete physical exam and multiple visits and she never has come back in for this. Update as much preventive care as I can other than her pelvic exam and Pap smear.  At visit 01/14/15 reassured her that the pain she is experiencing in the left scapula left neck and left chest are musculoskeletal. She apply heat to the region range of motion of the shoulder and neck frequently throughout the day and discussed stretches to do as well.  Mammogram: AT [facility] 03/27/2014-- She states she has never had mammogram. She does report that her maternal grandmother had breast cancer. Thinks was diagnosed around age 36 and she died at around age 43. And is agreeable for me to schedule a mammogram. AT [place] 07/02/14--- she reports that she did have her mammogram and it was negative.  Colorectal Cancer Screening: She has no indicatin to need this until age 69  DEXA: Can wait until closer to age 46 to start this  Immunizations: T dap----------- given here 03/27/14 Pneumovax---- given here 05/23/13 Influenza vaccine--- she  received for 2015-2016 flu season.  Signed, [address] [place], [place], [facility] 07/17/2015 9:35 AM

## 2015-07-21 ENCOUNTER — Encounter: Payer: Self-pay | Admitting: Physician Assistant

## 2015-07-29 ENCOUNTER — Other Ambulatory Visit: Payer: Self-pay | Admitting: Physician Assistant

## 2015-07-30 NOTE — Telephone Encounter (Signed)
Medication refilled per protocol. 

## 2015-08-24 ENCOUNTER — Other Ambulatory Visit: Payer: Self-pay | Admitting: Physician Assistant

## 2015-08-24 DIAGNOSIS — F17200 Nicotine dependence, unspecified, uncomplicated: Secondary | ICD-10-CM

## 2015-08-25 NOTE — Telephone Encounter (Signed)
Medication refilled per protocol. 

## 2015-09-29 ENCOUNTER — Other Ambulatory Visit: Payer: Self-pay | Admitting: Physician Assistant

## 2015-10-21 ENCOUNTER — Ambulatory Visit: Payer: BLUE CROSS/BLUE SHIELD | Admitting: Physician Assistant

## 2015-10-26 ENCOUNTER — Other Ambulatory Visit: Payer: Self-pay | Admitting: Physician Assistant

## 2015-12-03 ENCOUNTER — Other Ambulatory Visit: Payer: Self-pay | Admitting: Physician Assistant

## 2015-12-03 DIAGNOSIS — F17200 Nicotine dependence, unspecified, uncomplicated: Secondary | ICD-10-CM

## 2015-12-04 NOTE — Telephone Encounter (Signed)
RX refilled per protocol pt need appt/ letter out

## 2015-12-16 ENCOUNTER — Encounter: Payer: Self-pay | Admitting: Physician Assistant

## 2015-12-16 ENCOUNTER — Ambulatory Visit (INDEPENDENT_AMBULATORY_CARE_PROVIDER_SITE_OTHER): Payer: BLUE CROSS/BLUE SHIELD | Admitting: Physician Assistant

## 2015-12-16 VITALS — BP 120/76 | HR 95 | Temp 98.1°F | Resp 18 | Wt 220.0 lb

## 2015-12-16 DIAGNOSIS — J988 Other specified respiratory disorders: Secondary | ICD-10-CM

## 2015-12-16 MED ORDER — FLUTICASONE PROPIONATE 50 MCG/ACT NA SUSP: 2.0000 | Freq: Every day | NASAL | 6 refills | Status: DC

## 2015-12-16 MED ORDER — AZITHROMYCIN 250 MG PO TABS: ORAL_TABLET | ORAL | 0 refills | Status: DC

## 2015-12-16 NOTE — Progress Notes (Signed)
Patient ID: Mckenzie Tyler MRN: [id number], DOB: [birth year], 45 y.o. Date of Encounter: 12/16/2015, 1:26 PM    Chief Complaint:  Chief Complaint  Patient presents with  . Sinusitis    x2days  . Cough  . Headache     HPI: 45 y.o. year old female presents with above.   Says that symptoms have only been going on for a couple of days but the main reason she came on in-- is-- that she is supposed to fly out on Monday to go to [place] for the [employer] Thanksgiving parade with all of her family. Her mom is flying in to meet them her sisters are flying in as well.  Says that she is having pressure above her eyes and under her eyes. Is having some headache. Also cough. Has been feeling achy and sleepy. Has had no known fever. No sore throat or other symptoms.     Home Meds:   Outpatient Medications Prior to Visit  Medication Sig Dispense Refill  . aspirin 81 MG tablet Take 1 tablet (81 mg total) by mouth daily. 30 tablet 11  . buPROPion (WELLBUTRIN SR) 150 MG 12 hr tablet TAKE ONE TABLET BY MOUTH TWICE DAILY 60 tablet 0  . LANTUS SOLOSTAR 100 UNIT/ML Solostar Pen INJECT 100 UNITS SUBCUTANEOUSLY ONCE DAILY AT  10PM. 30 pen 5  . metFORMIN (GLUCOPHAGE) 1000 MG tablet TAKE ONE TABLET BY MOUTH TWICE DAILY WITH MEALS 180 tablet 0  . Multiple Vitamin (MULTIVITAMIN) tablet Take 1 tablet by mouth daily.    . ramipril (ALTACE) 2.5 MG capsule TAKE ONE CAPSULE BY MOUTH ONCE DAILY 90 capsule 0  . simvastatin (ZOCOR) 20 MG tablet TAKE ONE TABLET BY MOUTH ONCE DAILY AT  6  PM. 90 tablet 0   No facility-administered medications prior to visit.     Allergies: No Known Allergies    Review of Systems: See HPI for pertinent ROS. All other ROS negative.    Physical Exam: Blood pressure 120/76, pulse 95, temperature 98.1 F (36.7 C), temperature source Oral, resp. rate 18, weight 220 lb (99.8 kg), last menstrual period 04/25/2013, SpO2 98 %., Body mass index is 35.78 kg/m. General:  Obese  WF. Appears in no acute distress. HEENT: Normocephalic, atraumatic, eyes without discharge, sclera non-icteric, nares are without discharge. Bilateral auditory canals clear, TM's are without perforation, pearly grey and translucent with reflective cone of light bilaterally. Oral cavity moist, posterior pharynx without exudate, erythema, peritonsillar abscess. Very minimal tenderness with percussion to the frontal and maxillary sinuses bilaterally. She says that she just feels pressure and congestion in those regions.  Neck: Supple. No thyromegaly. No lymphadenopathy. Lungs: Clear bilaterally to auscultation without wheezes, rales, or rhonchi. Breathing is unlabored. Heart: Regular rhythm. No murmurs, rubs, or gallops. Msk:  Strength and tone normal for age. Extremities/Skin: Warm and dry.  Neuro: Alert and oriented X 3. Moves all extremities spontaneously. Gait is normal. CNII-XII grossly in tact. Psych:  Responds to questions appropriately with a normal affect.     ASSESSMENT AND PLAN:  45 y.o. year old female with  1. Respiratory infection I discussed that I will give her Flonase to use daily to improve the symptoms of sinus congestion. Need to avoid prednisone given her diabetes. Discussed that I really think that this is a virus and that if she takes antibiotics when she does not need them she is going to develop resistance which is going to only hurt her. I recommended that she monitor  her symptoms for a couple of days and only use antibiotics if symptoms are worsening at that point. If she does take start the antibiotic, then she needs to take it as directed and complete all of it. She voices understanding and agrees. - fluticasone (FLONASE) 50 MCG/ACT nasal spray; Place 2 sprays into both nostrils daily.  Dispense: 16 g; Refill: 6 - azithromycin (ZITHROMAX) 250 MG tablet; Day 1: Take 2 daily. Days 2-5: Take 1 daily.  Dispense: 6 tablet; Refill: 0   Signed, [address] [place], [place],  [facility] 12/16/2015 1:26 PM

## 2016-01-04 ENCOUNTER — Other Ambulatory Visit: Payer: Self-pay | Admitting: Physician Assistant

## 2016-01-04 DIAGNOSIS — F17200 Nicotine dependence, unspecified, uncomplicated: Secondary | ICD-10-CM

## 2016-01-05 NOTE — Telephone Encounter (Signed)
rx filled per protocol  

## 2016-01-21 ENCOUNTER — Ambulatory Visit (INDEPENDENT_AMBULATORY_CARE_PROVIDER_SITE_OTHER): Payer: BLUE CROSS/BLUE SHIELD | Admitting: Physician Assistant

## 2016-01-21 ENCOUNTER — Encounter: Payer: Self-pay | Admitting: Physician Assistant

## 2016-01-21 VITALS — BP 118/60 | HR 88 | Temp 98.0°F | Resp 16 | Wt 214.0 lb

## 2016-01-21 DIAGNOSIS — F17200 Nicotine dependence, unspecified, uncomplicated: Secondary | ICD-10-CM

## 2016-01-21 DIAGNOSIS — E785 Hyperlipidemia, unspecified: Secondary | ICD-10-CM | POA: Diagnosis not present

## 2016-01-21 DIAGNOSIS — E119 Type 2 diabetes mellitus without complications: Secondary | ICD-10-CM | POA: Diagnosis not present

## 2016-01-21 LAB — COMPLETE METABOLIC PANEL WITH GFR
ALBUMIN: 3.9 g/dL (ref 3.6–5.1)
ALK PHOS: 37 U/L (ref 33–115)
ALT: 58 U/L — ABNORMAL HIGH (ref 6–29)
AST: 52 U/L — AB (ref 10–35)
BUN: 13 mg/dL (ref 7–25)
CHLORIDE: 103 mmol/L (ref 98–110)
CO2: 26 mmol/L (ref 20–31)
Calcium: 8.8 mg/dL (ref 8.6–10.2)
Creat: 0.73 mg/dL (ref 0.50–1.10)
GFR, Est African American: >89 mL/min (ref >=60)
GFR, Est Non African American: >89 mL/min (ref >=60)
Glucose, Bld: 299 mg/dL — ABNORMAL HIGH (ref 70–99)
POTASSIUM: 4.6 mmol/L (ref 3.5–5.3)
SODIUM: 138 mmol/L (ref 135–146)
Total Bilirubin: 0.6 mg/dL (ref 0.2–1.2)
Total Protein: 6.3 g/dL (ref 6.1–8.1)

## 2016-01-21 LAB — HEMOGLOBIN A1C
Hgb A1c MFr Bld: 7.5 % — ABNORMAL HIGH (ref <5.7)
MEAN PLASMA GLUCOSE: 169 mg/dL

## 2016-01-21 LAB — LIPID PANEL
CHOL/HDL RATIO: 4.3 ratio (ref <5.0)
Cholesterol: 128 mg/dL (ref <200)
HDL: 30 mg/dL — AB (ref >50)
LDL CALC: 54 mg/dL (ref <100)
Triglycerides: 220 mg/dL — ABNORMAL HIGH (ref <150)
VLDL: 44 mg/dL — ABNORMAL HIGH (ref <30)

## 2016-01-21 NOTE — Progress Notes (Signed)
Patient ID: Mckenzie Tyler MRN: [id number], DOB: [birth year], 45 y.o. Date of Encounter: @DATE @  Chief Complaint:  Chief Complaint  Patient presents with  . diabetic check    HPI: 45 y.o. year old white female  presents for routine followup.   She saw me for an office visit 11/22/12. Prior to that visit her last visit with our office had been August 2011. She had also reported in the past that she was seeing Dr. Isabel Tyler for her diabetes. She came to see me 11/22/12 asked her about this. She stated that she had gotten frustrated because she felt like she kept adjusting medications for her blood sugar but they were never getting control. Therefore she ended up stopping all of her medications and stopped all followup around 2010. She reported that back then she was on insulin 70/30 at 30 units twice a day. She had been using no diabetic treatment since 2010. Asked what prompted her to come in to restart treatment 11/22/12. She stated that her fianc recently had dorsum of the amputation secondary to uncontrolled diabetes.  At visit 11/22/12 A1c was 9.9. Started Lantus at that time. Plan to check labs and creatinine normal at metformin.  She had followup with me 12/06/12. Metformin was added. She is then to continue to titrate her Lantus.  At [place] 01/2013 she reported that she was able to titrate the metformin up to 1000 mg twice a day. She was having no adverse effects and was tolerating this well.  At office visit January 2015 we added Invokana. She said that this made a huge difference in her blood sugars. Says it definitely decreasing quite a bit.  At  Visit 10/14 we checked FLP LFT. Recommended starting simvastatin 10 mg. She states that she is taking this as directed. She is having no myalgias or other adverse effects.   Also at her visit 11/25/2012 she reported that she did use Chantix in the past and it worked very well for her. She has stayed clear for 2-3 years but then back to  smoking about 1-1/2 years ago.  At that visit I gave her another prescription of Chantix. At [place] 1/15 she stated that she finished the Chantix 2 weeks ago and has not smoked any for 71 days.  Said that she has no cravings to smoke. Said she had even been around smokers at times and has had no cravings. At [place] 05/23/13 she reports she has continued to stay off of the cigarettes and still is having absolutely no cravings. !!!!  Unfortunately, at [place] 12/2013--she says that she had quit smoking 12/01/2012. Says that she had gone to almost her one-year mark but then over Halloween weekend she went to a party. Smoked at that party. Bought a pack of cigarettes that following week. Has been smoking for these past couple of weeks. Wants to get back on Chantix so that she can stop smoking again. At [place] 12/18/2013--prescribed Chantix. At [place] 03/27/2014--says that she is still taking the Chantix. Says that she is off of cigarettes.  Says that even with the coupon the Chantix is very expensive. So says that she has gained 7 pounds since her last visit and thinks that a lot of that has to do with munching in place of smoking.  At [place] 10/01/2014--- says that she has stayed off of the cigarettes and is not smoking at all. Says that she is trying to stay away from her friends that do smoke.   She does  no exercise. SHe tries to be compliant with her diet. Says She actually has gotten to where she would prefer to eat an apple or carrots for snacks. She works at [employer] and has to work 12 hour shifts 3 days a week.  At [place] 10/01/2014--- she says that she has been using something called [employer]--says that she orders this on line. Says that it is a lot of protein. She drinks a shake in the morning and a shake around noon and then eats dinner meal. Says that she eats healthy snacks between so that she is taking in something about every 3 hours. Eats something like carrots with hummus. Drinks 1 gallon of water a day.  At office  visit 03/28/14 she states that she has an eye appointment later today. She says that her fianc who has diabetes as having a lot of problems with his eyes. She says that when she went for her eye exam last year they told her they did see some problems with her retina and told her to follow up with a retina specialist but she never did.  12/2013--A1c came back 6.4. On that lab results were told her to make sure she was getting no low readings. She says that she was getting some low readings in the night so she has decreased her Lantus dose since then. Says that she recently has not been checking her blood sugar on a daily basis anymore.  07/02/2014--she says that when she went to the eye appointment 03/27/14. Since then went to a retina specialist. Was given bilateral eye injections--For diabetic retinopathy. Has to repeat these injections every 4-6 weeks. Says that she is not smoking at all. Completed her Chantix 1 week ago. Says that she has gotten to the point that she wants to change her life and make lifestyle changes. Has been using juice cleanses and feels so much better with this. Is not eating the foods that she used to in the past anymore. Says that she has been able to decrease her Lantus from 100 down to 60 units. Says that the lowest sugar she gets is about 93--and that is fasting in the morning. No complaints or concerns at this visit.  At [place] 10/01/2014---  She says that she did "2 series of injections to the eye". Says that when she completed this, the Retina Specialist told her that they did not think she would need any further injections. Says she goes for follow-up in October. Says that she has remained off of cigarettes. Says that she is using [employer] which is something she ordered on line. It is high protein. She drinks one shake in the morning, 1 shake in the afternoon, and then eats a dinner meal. Eats healthy snacks between such as hummus and carrots. Drinks 1 gallon of water per  day. Says that her Lantus is still at 60 units--- but that if she knows she has eaten very low amount of carbs, then she may just use Lantus 50 units but if she eats spaghetti or something then she will use 60.  At [place] 10/01/2014:  She reports that on both hands:  her thumb and all fingers except for her pinky fingers: These feel numb and tingly when she wakes up in the morning. Says even when she shakes them out and tries to move them, the tingling does not resolve. Says that it is present when she wakes up in the morning and it takes about 1 hour for it to resolve. She  does not notice the symptoms during the day at other times. Says that she works for [employer]. --for past 1 1/2 years. Says that she is on the computer some but not all day. 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  She also states that she has been feeling pain in both of her elbows recently. Cannot think of any repetitive motion she has done with her arms recently.  At [place] 01/14/2015: She states that she started back smoking right after Thanksgiving. She was going to parties and around friends who were smoking. However says that she got a new car so she is not smoking in the car. Is only smoking 2 or 3 cigarettes per day. Wants to get back on Chantix so she can quit completely.  Says that she recently had pain in her left shoulder blade. Says that she has applied muscle rub to that has gotten better. Says also she has had some pain up in the left neck. Says now her left anterior chest sore with palpation. Says that she is left-handed. Says that she has been getting out Christmas stuff and moving big boxes etc.  Says that she checks blood sugar every morning. Says this morning was 106. Says the highest has been 140 and it just depends on what she ate the night before.  She is taking her statin as directed. No myalgias or other adverse effects.  At [place] 04/15/2015: She reports that the savings card/coupon ran out on her  Chantix as well as Invokana,. She reports that without the savings cards both of these were very expensive. Therefore she has stopped both of these medications. Says that she is smoking about 6-7 cigarettes per day. Says that even without the invokana, her fasting blood sugars are 110-120. Says that she has not had to increase her insulin and has cut that dose the same. States that her boyfriend is having to have bypass surgery--- says they have been eating extremely healthy--- says this is probably helping regarding keeping her sugars down despite stopping the Invokana.  She states that with her job she is walking constantly all day. Sometimes has to carry boxes up steps. Is having no chest pressure heaviness tightness or squeezing in no increased shortness of breath with exertion. Has not had any follow-up with eye doctor since last visit. No other complaints or concerns or updates.  [facility] 07/16/2015: NOTE: At [place] 07/16/2015 she reported she had kicked out her boyfriend--"he wasn't taking care of himself, couldn't sit there and watch him die' However, since he has been gone, she has fond herself eating worse. Says she thinks when he was there, she felt like she needed to be "the good example". Now she is only accountable to herself. Also finding it difficult to cook for just one person. However, not getting back with him, she says. She says Wellbutrin has helped a lot with smoking. Currently just smoking 1 -2 cigarettes on weekends with friends. "Is done seeing Retina specialist" Due for regular eye exam in August.  Went to dentist this morning.   01/21/2016: She states that after her last visit with me in November I gave her an antibiotic but told her to only fill it if she had certain symptoms. States that she never did take the antibiotic. States that she continues to have a nonproductive hacky cough. Says that it's like a tickle in her throat and thinks that she can cough something up but then  never did can. Currently  is smoking about 4 or 5 cigarettes per day. Is taking the Wellbutrin and this is causing no adverse effects and is helping to decrease her cravings for cigarettes. Says that a couple of weeks ago her sugar kept running higher than usual for some reason (was not eating different than usual) but has come back down in the last week or so. Says that she has stayed broken up with her boyfriend and has not gotten back with him. Says that she is doing a little better with her diet than she was at the last visit. She has a cousin that has a restaurant in [place] and they're able to go up to the roof top at [employer] for [employer]. Also at her last visit she had told me that she was going to [place] for the [employer] Thanksgiving parade. Was meeting multiple family members for that. She has no other specific concerns to address today. She is taking metformin and insulin as directed. No adverse effects. No blood low blood sugar readings. She is taking simvastatin as directed. No myalgias or other adverse effects.  Past Medical History:  Diagnosis Date  . Depression   . Diabetes mellitus without complication ([place])   . Hyperlipidemia 02/13/2013  . Smoker      Home Meds:  Outpatient Medications Prior to Visit  Medication Sig Dispense Refill  . aspirin 81 MG tablet Take 1 tablet (81 mg total) by mouth daily. 30 tablet 11  . buPROPion (WELLBUTRIN SR) 150 MG 12 hr tablet TAKE ONE TABLET BY MOUTH TWICE DAILY 60 tablet 0  . LANTUS SOLOSTAR 100 UNIT/ML Solostar Pen INJECT 100 UNITS SUBCUTANEOUSLY ONCE DAILY AT  10PM. 30 pen 5  . metFORMIN (GLUCOPHAGE) 1000 MG tablet TAKE ONE TABLET BY MOUTH TWICE DAILY WITH MEALS 180 tablet 0  . Multiple Vitamin (MULTIVITAMIN) tablet Take 1 tablet by mouth daily.    . ramipril (ALTACE) 2.5 MG capsule TAKE ONE CAPSULE BY MOUTH ONCE DAILY 90 capsule 0  . simvastatin (ZOCOR) 20 MG tablet TAKE ONE TABLET BY MOUTH ONCE DAILY AT  6  PM. 90 tablet 0  .  azithromycin (ZITHROMAX) 250 MG tablet Day 1: Take 2 daily. Days 2-5: Take 1 daily. (Patient not taking: Reported on 01/21/2016) 6 tablet 0  . fluticasone (FLONASE) 50 MCG/ACT nasal spray Place 2 sprays into both nostrils daily. (Patient not taking: Reported on 01/21/2016) 16 g 6   No facility-administered medications prior to visit.      Allergies: No Known Allergies  Social History   Social History  . Marital status: Married    Spouse name: N/A  . Number of children: N/A  . Years of education: N/A   Occupational History  . Not on file.   Social History Main Topics  . Smoking status: Current Every Day Smoker    Packs/day: 0.25    Types: Cigarettes  . Smokeless tobacco: Never Used     Comment: has been trying to quit  . Alcohol use No  . Drug use: No  . Sexual activity: Yes    Birth control/ protection: None   Other Topics Concern  . Not on file   Social History Narrative  . No narrative on file    Family History  Problem Relation Age of Onset  . Diabetes Father   . Cancer Neg Hx   . Heart disease Neg Hx      Review of Systems:  See HPI for pertinent ROS. All other ROS  negative.    Physical Exam: Blood pressure 118/60, pulse 88, temperature 98 F (36.7 C), temperature source Oral, resp. rate 16, weight 214 lb (97.1 kg), last menstrual period 04/25/2013, SpO2 97 %., Body mass index is 34.8 kg/m. General: Obese WF. Appears in no acute distress. Neck: Supple. No thyromegaly. No lymphadenopathy. No carotid bruits. Lungs: Clear bilaterally to auscultation without wheezes, rales, or rhonchi. Breathing is unlabored. Heart: RRR with S1 S2. No murmurs, rubs, or gallops. Abdomen: Soft, non-tender, non-distended with normoactive bowel sounds. No hepatomegaly. No rebound/guarding. No obvious abdominal masses. Musculoskeletal:  Strength and tone normal for age. Extremities/Skin: Warm and dry.  No edema.  Neuro: Alert and oriented X 3. Moves all extremities spontaneously.  Gait is normal. CNII-XII grossly in tact. Psych:  Responds to questions appropriately with a normal affect. Diabetic foot exam: inspection is normal. 2+ bilateral dorsalis pedis and posterior tibial pulses. Sensation is intact bilaterally.     ASSESSMENT AND PLAN:  45 y.o. year old female with   1. Diabetes mellitus without complication  *-* [id number] OV--c/o dry, hacky cough----told her to D/C Altace. In 2 weeks, if cough not resolved, call me (In that case, would obtain CXR given she is smoker, would add Zyrtec and Omeprazole and have her schedule f/u OV 2 weeks) ---I considered changing this to an ARB so that she would have renal protection given her diabetes but her blood pressure is on the low side and I'm concerned that her blood pressure will get too low even with losartan 50 mg.----Follow this up at next visit.   01/21/2016-- Check Hemoglobin A1c  MicroAlbumin---  07/2015  She is on statin. On ACE inhibitor. Added Altace 2.5 mg daily at [place] 05/23/13--she states that she is taking this and is having no adverse effects.------SEE NOTE ABOVE DATED 01/21/2016------------------------------- On aspirin 81 mg daily--Added at [facility] 05/23/13--she states that she is taking this daily and is having no bleeding.  Pneumovax 23--given here 05/23/13  She reports that she did have a diabetic eye exam January 2015. Had another routine diabetic eye exam 03/27/14. Since then, has been following up with retina specialist and getting injections in both eyes every 4-6 weeks.  At office visit 03/28/14 she states that she has an eye appointment later today. She says that her fianc who has diabetes as having a lot of problems with his eyes. She says that when she went for her eye exam last year they told her they did see some problems with her retina and told her to follow up with a retina specialist but she never did.  She is aware of proper foot care.   2. Hyperlipidemia She is on simvastatin. Lipid panel  07/02/2014--excellent---LDL 61 Not fasting today-- will check CMET --Can wait to repeat lipid panel. 01/21/2016--- she states that she did have a couple of small rice cakes about 2 hours ago with water. Last lipid panel was 07/2014 so we'll go ahead and recheck FLP now--- she has not completely fasting but I do not think the rice cakes full effect lipid panel much.  3. Smoker At visit 04/15/15 discussed using Wellbutrin to help with smoking cessation. She states that she has never used this but is agreeable to try this. She is to take 1 daily for the first 5 days then increase to 1 twice daily. If any adverse effects and stop the medicine and call us. At [place] 07/16/15--says wellbutrin has really helped--only smoking 1-2 cigarettes on weekends 01/21/2016--she is still on the Wellbutrin.  She is smoking 4 or 5 cigarettes per day. Continue current dose of Wellbutrin. Told her to try to work on keeping smoking at a minimum.  I discussed her scheduling a complete physical exam and multiple visits and she never has come back in for this. Update as much preventive care as I can other than her pelvic exam and Pap smear.  Mammogram: AT [place] 03/27/2014-- She states she has never had mammogram. She does report that her maternal grandmother had breast cancer. Thinks was diagnosed around age 89 and she died at around age 65. And is agreeable for me to schedule a mammogram. AT [place] 07/02/14--- she reports that she did have her mammogram and it was negative. ------WILL DISCUSS SCHEDULING ANOTHER MAMMOGRAM AT NEXT OV--------------------------------------------------  Colorectal Cancer Screening: She has no indication to need this until age 16  DEXA: Can wait until closer to age 36 to start this  Immunizations: T dap----------- given here 03/27/14 Pneumovax---- given here 05/23/13 Influenza vaccine--- she  received for 2015-2016 flu season.  Signed, [address] [place], [place], [facility] 01/21/2016 11:47 AM

## 2016-02-03 ENCOUNTER — Encounter: Payer: Self-pay | Admitting: *Deleted

## 2016-02-04 ENCOUNTER — Other Ambulatory Visit: Payer: Self-pay | Admitting: Physician Assistant

## 2016-02-04 DIAGNOSIS — F17200 Nicotine dependence, unspecified, uncomplicated: Secondary | ICD-10-CM

## 2016-02-08 ENCOUNTER — Encounter: Payer: Self-pay | Admitting: Physician Assistant

## 2016-02-25 ENCOUNTER — Telehealth: Payer: Self-pay

## 2016-02-25 NOTE — Telephone Encounter (Signed)
Pt called aand left Vm stating the cost of her Insulin is $200.00 and is req a cheaper alt. Called pt Lvm that I will cke with ins for cheaper alternatives.

## 2016-02-26 NOTE — Telephone Encounter (Signed)
Spoke with [place] with [facility] regarding pricing. Latoya states there are no alt that are cheaper than Lantus  Pt ded $3000 what has been met $0. Latoya states pt may have had  a coupon to help with co-pay. Called pt LVM

## 2016-03-01 ENCOUNTER — Telehealth: Payer: Self-pay

## 2016-03-01 NOTE — Telephone Encounter (Signed)
Pt called regarding the pricing of her lantus. Explained to pt she has to meet her ded with her ins com and that there were no alt.  Pt is req lantus vials instead of the pen explained to pt I would contact ins company to se if they will cover them and is so the price. Pt is fine with that also offered to mail pt and Rx assistance form

## 2016-03-01 NOTE — Telephone Encounter (Signed)
I contacted pt insurance and it would be cheaper for her to get Lantus vials instead of the pens. Is it okay to switch from pens to vials?

## 2016-03-02 MED ORDER — "INSULIN SYRINGE-NEEDLE U-100 25G X 1"" 1 ML MISC": 3 refills | Status: DC

## 2016-03-02 MED ORDER — INSULIN GLARGINE 100 UNIT/ML ~~LOC~~ SOLN: 100.0000 [IU] | Freq: Every day | SUBCUTANEOUS | 11 refills | Status: DC

## 2016-03-02 NOTE — Telephone Encounter (Signed)
Rx filled pt aware 

## 2016-03-02 NOTE — Telephone Encounter (Signed)
Yes. Approved. 

## 2016-03-08 ENCOUNTER — Other Ambulatory Visit: Payer: Self-pay | Admitting: Family Medicine

## 2016-03-08 DIAGNOSIS — F17200 Nicotine dependence, unspecified, uncomplicated: Secondary | ICD-10-CM

## 2016-03-25 ENCOUNTER — Encounter: Payer: Self-pay | Admitting: Physician Assistant

## 2016-04-16 ENCOUNTER — Other Ambulatory Visit: Payer: Self-pay | Admitting: Physician Assistant

## 2016-04-18 NOTE — Telephone Encounter (Signed)
Refill appropriate 

## 2016-04-21 ENCOUNTER — Ambulatory Visit: Payer: BLUE CROSS/BLUE SHIELD | Admitting: Physician Assistant

## 2016-05-02 ENCOUNTER — Other Ambulatory Visit: Payer: Self-pay | Admitting: Family Medicine

## 2016-05-02 ENCOUNTER — Other Ambulatory Visit: Payer: Self-pay | Admitting: Physician Assistant

## 2016-05-06 ENCOUNTER — Other Ambulatory Visit: Payer: Self-pay | Admitting: Family Medicine

## 2016-05-06 NOTE — Telephone Encounter (Signed)
Patient has been discharged from practice.  Medication refill denied 

## 2016-05-09 ENCOUNTER — Other Ambulatory Visit: Payer: Self-pay

## 2017-04-24 ENCOUNTER — Other Ambulatory Visit: Payer: Self-pay

## 2017-04-24 ENCOUNTER — Encounter: Payer: Self-pay | Admitting: Physician Assistant

## 2017-04-24 ENCOUNTER — Ambulatory Visit (INDEPENDENT_AMBULATORY_CARE_PROVIDER_SITE_OTHER): Payer: BLUE CROSS/BLUE SHIELD | Admitting: Physician Assistant

## 2017-04-24 VITALS — BP 118/70 | HR 102 | Temp 98.3°F | Resp 16 | Wt 208.6 lb

## 2017-04-24 DIAGNOSIS — E119 Type 2 diabetes mellitus without complications: Secondary | ICD-10-CM

## 2017-04-24 DIAGNOSIS — F17200 Nicotine dependence, unspecified, uncomplicated: Secondary | ICD-10-CM

## 2017-04-24 DIAGNOSIS — E785 Hyperlipidemia, unspecified: Secondary | ICD-10-CM | POA: Diagnosis not present

## 2017-04-24 MED ORDER — METFORMIN HCL 1000 MG PO TABS: 1000.0000 mg | ORAL_TABLET | Freq: Two times a day (BID) | ORAL | 2 refills | Status: DC

## 2017-04-24 MED ORDER — INSULIN GLARGINE 100 UNIT/ML ~~LOC~~ SOLN: 100.0000 [IU] | Freq: Every day | SUBCUTANEOUS | 11 refills | Status: DC

## 2017-04-24 NOTE — Progress Notes (Signed)
Patient ID: Mckenzie Tyler MRN: [id number], DOB: [birth year], 47 y.o. Date of Encounter: @DATE @  Chief Complaint:  Chief Complaint  Patient presents with  . 3 month follow up    HPI: 47 y.o. year old white female  presents for routine followup.   She saw me for an office visit 11/22/12. Prior to that visit her last visit with our office had been August 2011. She had also reported in the past that she was seeing Dr. Isabel Caprice for her diabetes. She came to see me 11/22/12 asked her about this. She stated that she had gotten frustrated because she felt like she kept adjusting medications for her blood sugar but they were never getting control. Therefore she ended up stopping all of her medications and stopped all followup around 2010. She reported that back then she was on insulin 70/30 at 30 units twice a day. She had been using no diabetic treatment since 2010. Asked what prompted her to come in to restart treatment 11/22/12. She stated that her fianc recently had dorsum of the amputation secondary to uncontrolled diabetes.  At visit 11/22/12 A1c was 9.9. Started Lantus at that time. Plan to check labs and creatinine normal at metformin.  She had followup with me 12/06/12. Metformin was added. She is then to continue to titrate her Lantus.  At [place] 01/2013 she reported that she was able to titrate the metformin up to 1000 mg twice a day. She was having no adverse effects and was tolerating this well.  At office visit January 2015 we added Invokana. She said that this made a huge difference in her blood sugars. Says it definitely decreasing quite a bit.  At  Visit 10/14 we checked FLP LFT. Recommended starting simvastatin 10 mg. She states that she is taking this as directed. She is having no myalgias or other adverse effects.   Also at her visit 11/25/2012 she reported that she did use Chantix in the past and it worked very well for her. She has stayed clear for 2-3 years but then back to  smoking about 1-1/2 years ago.  At that visit I gave her another prescription of Chantix. At [place] 1/15 she stated that she finished the Chantix 2 weeks ago and has not smoked any for 71 days.  Said that she has no cravings to smoke. Said she had even been around smokers at times and has had no cravings. At [place] 05/23/13 she reports she has continued to stay off of the cigarettes and still is having absolutely no cravings. !!!!  Unfortunately, at [place] 12/2013--she says that she had quit smoking 12/01/2012. Says that she had gone to almost her one-year mark but then over Halloween weekend she went to a party. Smoked at that party. Bought a pack of cigarettes that following week. Has been smoking for these past couple of weeks. Wants to get back on Chantix so that she can stop smoking again. At [place] 12/18/2013--prescribed Chantix. At [place] 03/27/2014--says that she is still taking the Chantix. Says that she is off of cigarettes.  Says that even with the coupon the Chantix is very expensive. So says that she has gained 7 pounds since her last visit and thinks that a lot of that has to do with munching in place of smoking.  At [place] 10/01/2014--- says that she has stayed off of the cigarettes and is not smoking at all. Says that she is trying to stay away from her friends that do smoke.  She does no exercise. SHe tries to be compliant with her diet. Says She actually has gotten to where she would prefer to eat an apple or carrots for snacks. She works at [employer] and has to work 12 hour shifts 3 days a week.  At [place] 10/01/2014--- she says that she has been using something called [employer]--says that she orders this on line. Says that it is a lot of protein. She drinks a shake in the morning and a shake around noon and then eats dinner meal. Says that she eats healthy snacks between so that she is taking in something about every 3 hours. Eats something like carrots with hummus. Drinks 1 gallon of water a day.  At office  visit 03/28/14 she states that she has an eye appointment later today. She says that her fianc who has diabetes as having a lot of problems with his eyes. She says that when she went for her eye exam last year they told her they did see some problems with her retina and told her to follow up with a retina specialist but she never did.  12/2013--A1c came back 6.4. On that lab results were told her to make sure she was getting no low readings. She says that she was getting some low readings in the night so she has decreased her Lantus dose since then. Says that she recently has not been checking her blood sugar on a daily basis anymore.  07/02/2014--she says that when she went to the eye appointment 03/27/14. Since then went to a retina specialist. Was given bilateral eye injections--For diabetic retinopathy. Has to repeat these injections every 4-6 weeks. Says that she is not smoking at all. Completed her Chantix 1 week ago. Says that she has gotten to the point that she wants to change her life and make lifestyle changes. Has been using juice cleanses and feels so much better with this. Is not eating the foods that she used to in the past anymore. Says that she has been able to decrease her Lantus from 100 down to 60 units. Says that the lowest sugar she gets is about 93--and that is fasting in the morning. No complaints or concerns at this visit.  At [place] 10/01/2014---  She says that she did "2 series of injections to the eye". Says that when she completed this, the Retina Specialist told her that they did not think she would need any further injections. Says she goes for follow-up in October. Says that she has remained off of cigarettes. Says that she is using [employer] which is something she ordered on line. It is high protein. She drinks one shake in the morning, 1 shake in the afternoon, and then eats a dinner meal. Eats healthy snacks between such as hummus and carrots. Drinks 1 gallon of water per  day. Says that her Lantus is still at 60 units--- but that if she knows she has eaten very low amount of carbs, then she may just use Lantus 50 units but if she eats spaghetti or something then she will use 60.  At [place] 10/01/2014:  She reports that on both hands:  her thumb and all fingers except for her pinky fingers: These feel numb and tingly when she wakes up in the morning. Says even when she shakes them out and tries to move them, the tingling does not resolve. Says that it is present when she wakes up in the morning and it takes about 1 hour for it to  resolve. She does not notice the symptoms during the day at other times. Says that she works for [employer]. --for past 1 1/2 years. Says that she is on the computer some but not all day. 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  She also states that she has been feeling pain in both of her elbows recently. Cannot think of any repetitive motion she has done with her arms recently.  At [place] 01/14/2015: She states that she started back smoking right after Thanksgiving. She was going to parties and around friends who were smoking. However says that she got a new car so she is not smoking in the car. Is only smoking 2 or 3 cigarettes per day. Wants to get back on Chantix so she can quit completely.  Says that she recently had pain in her left shoulder blade. Says that she has applied muscle rub to that has gotten better. Says also she has had some pain up in the left neck. Says now her left anterior chest sore with palpation. Says that she is left-handed. Says that she has been getting out Christmas stuff and moving big boxes etc.  Says that she checks blood sugar every morning. Says this morning was 106. Says the highest has been 140 and it just depends on what she ate the night before.  She is taking her statin as directed. No myalgias or other adverse effects.  At [place] 04/15/2015: She reports that the savings card/coupon ran out on her  Chantix as well as Invokana,. She reports that without the savings cards both of these were very expensive. Therefore she has stopped both of these medications. Says that she is smoking about 6-7 cigarettes per day. Says that even without the invokana, her fasting blood sugars are 110-120. Says that she has not had to increase her insulin and has cut that dose the same. States that her boyfriend is having to have bypass surgery--- says they have been eating extremely healthy--- says this is probably helping regarding keeping her sugars down despite stopping the Invokana.  She states that with her job she is walking constantly all day. Sometimes has to carry boxes up steps. Is having no chest pressure heaviness tightness or squeezing in no increased shortness of breath with exertion. Has not had any follow-up with eye doctor since last visit. No other complaints or concerns or updates.  [place] 07/16/2015: NOTE: At [place] 07/16/2015 she reported she had kicked out her boyfriend--"he wasn't taking care of himself, couldn't sit there and watch him die' However, since he has been gone, she has fond herself eating worse. Says she thinks when he was there, she felt like she needed to be "the good example". Now she is only accountable to herself. Also finding it difficult to cook for just one person. However, not getting back with him, she says. She says Wellbutrin has helped a lot with smoking. Currently just smoking 1 -2 cigarettes on weekends with friends. "Is done seeing Retina specialist" Due for regular eye exam in August.  Went to dentist this morning.    01/21/2016: She states that after her last visit with me in November I gave her an antibiotic but told her to only fill it if she had certain symptoms. States that she never did take the antibiotic. States that she continues to have a nonproductive hacky cough. Says that it's like a tickle in her throat and thinks that she can cough something up but then  never  did can. Currently is smoking about 4 or 5 cigarettes per day. Is taking the Wellbutrin and this is causing no adverse effects and is helping to decrease her cravings for cigarettes. Says that a couple of weeks ago her sugar kept running higher than usual for some reason (was not eating different than usual) but has come back down in the last week or so. Says that she has stayed broken up with her boyfriend and has not gotten back with him. Says that she is doing a little better with her diet than she was at the last visit. She has a cousin that has a restaurant in [place] and they're able to go up to the roof top at [employer] for [employer]. Also at her last visit she had told me that she was going to [place] for the [employer] Thanksgiving parade. Was meeting multiple family members for that. She has no other specific concerns to address today. She is taking metformin and insulin as directed. No adverse effects. No blood low blood sugar readings. She is taking simvastatin as directed. No myalgias or other adverse effects.   04/24/2017: Patient states that she is off of all of her medicines.   Says that she has been out of her insulin for over 6 weeks and has been off of all of the other medicines for about a year.   She also states that she is back to smoking.   She is not fasting today but states that she can return tomorrow morning fasting for labs. She does not really have any reason/explanation for this noncompliance.   Past Medical History:  Diagnosis Date  . Depression   . Diabetes mellitus without complication ([place])   . Hyperlipidemia 02/13/2013  . Smoker      Home Meds:  Outpatient Medications Prior to Visit  Medication Sig Dispense Refill  . insulin glargine (LANTUS) 100 UNIT/ML injection Inject 1 mL (100 Units total) into the skin at bedtime. 3 vial 11  . Insulin Syringe-Needle U-100 25G X 1" 1 ML MISC Use as directed 100 each 3  . metFORMIN (GLUCOPHAGE) 1000 MG  tablet TAKE ONE TABLET BY MOUTH TWICE DAILY WITH MEALS 180 tablet 0  . buPROPion (WELLBUTRIN SR) 150 MG 12 hr tablet TAKE ONE TABLET BY MOUTH TWICE DAILY (Patient not taking: Reported on 04/24/2017) 60 tablet 3  . fluticasone (FLONASE) 50 MCG/ACT nasal spray Place 2 sprays into both nostrils daily. (Patient not taking: Reported on 01/21/2016) 16 g 6  . Multiple Vitamin (MULTIVITAMIN) tablet Take 1 tablet by mouth daily.    . simvastatin (ZOCOR) 20 MG tablet TAKE ONE TABLET BY MOUTH ONCE DAILY AT  6PM (Patient not taking: Reported on 04/24/2017) 90 tablet 0  . aspirin 81 MG tablet Take 1 tablet (81 mg total) by mouth daily. 30 tablet 11  . azithromycin (ZITHROMAX) 250 MG tablet Day 1: Take 2 daily. Days 2-5: Take 1 daily. (Patient not taking: Reported on 01/21/2016) 6 tablet 0  . simvastatin (ZOCOR) 20 MG tablet TAKE ONE TABLET BY MOUTH ONCE DAILY AT  6  PM. 90 tablet 0   No facility-administered medications prior to visit.      Allergies: No Known Allergies  Social History   Socioeconomic History  . Marital status: Married    Spouse name: Not on file  . Number of children: Not on file  . Years of education: Not on file  . Highest education level: Not on file  Occupational History  .  Not on file  Social Needs  . Financial resource strain: Not on file  . Food insecurity:    Worry: Not on file    Inability: Not on file  . Transportation needs:    Medical: Not on file    Non-medical: Not on file  Tobacco Use  . Smoking status: Current Every Day Smoker    Packs/day: 0.25    Types: Cigarettes  . Smokeless tobacco: Never Used  . Tobacco comment: has been trying to quit  Substance and Sexual Activity  . Alcohol use: No    Alcohol/week: 0.0 oz  . Drug use: No  . Sexual activity: Yes    Birth control/protection: None  Lifestyle  . Physical activity:    Days per week: Not on file    Minutes per session: Not on file  . Stress: Not on file  Relationships  . Social connections:     Talks on phone: Not on file    Gets together: Not on file    Attends religious service: Not on file    Active member of club or organization: Not on file    Attends meetings of clubs or organizations: Not on file    Relationship status: Not on file  . Intimate partner violence:    Fear of current or ex partner: Not on file    Emotionally abused: Not on file    Physically abused: Not on file    Forced sexual activity: Not on file  Other Topics Concern  . Not on file  Social History Narrative  . Not on file    Family History  Problem Relation Age of Onset  . Diabetes Father   . Cancer Neg Hx   . Heart disease Neg Hx      Review of Systems:  See HPI for pertinent ROS. All other ROS negative.    Physical Exam: Blood pressure 118/70, pulse (!) 102, temperature 98.3 F (36.8 C), temperature source Oral, resp. rate 16, weight 94.6 kg (208 lb 9.6 oz), last menstrual period 04/25/2013, SpO2 93 %., Body mass index is 33.93 kg/m. General: Obese WF. Appears in no acute distress. Neck: Supple. No thyromegaly. No lymphadenopathy. No carotid bruits. Lungs: Clear bilaterally to auscultation without wheezes, rales, or rhonchi. Breathing is unlabored. Heart: RRR with S1 S2. No murmurs, rubs, or gallops. Abdomen: Soft, non-tender, non-distended with normoactive bowel sounds. No hepatomegaly. No rebound/guarding. No obvious abdominal masses. Musculoskeletal:  Strength and tone normal for age. Extremities/Skin: Warm and dry.  No edema.  Neuro: Alert and oriented X 3. Moves all extremities spontaneously. Gait is normal. CNII-XII grossly in tact. Psych:  Responds to questions appropriately with a normal affect. Diabetic foot exam: inspection is normal. 2+ bilateral dorsalis pedis and posterior tibial pulses. Sensation is intact bilaterally.     ASSESSMENT AND PLAN:  47 y.o. year old female with   1. Diabetes mellitus without complication 7/61/6073: At last visit she was on statin.  At prior  visits she had reported having routine eye exams and was educated regarding proper foot care.  Will she definitely knows information regarding low carbohydrate diet and proper diet.  She reports that she has been off of insulin for a little over 6 weeks and off all other medicines for about 1 year.  She will return tomorrow morning fasting for labs.  2. Hyperlipidemia 04/24/2017: At ime of last visit with me she was on simvastatin.  ------------- Today she reports that she has been off medication for  about a year.  She will return fasting for labs tomorrow.  3. Smoker At visit 04/15/15 discussed using Wellbutrin to help with smoking cessation. She states that she has never used this but is agreeable to try this. She is to take 1 daily for the first 5 days then increase to 1 twice daily. If any adverse effects and stop the medicine and call us. At [place] 07/16/15--says wellbutrin has really helped--only smoking 1-2 cigarettes on weekends 01/21/2016--she is still on the Wellbutrin. She is smoking 4 or 5 cigarettes per day. Continue current dose of Wellbutrin. Told her to try to work on keeping smoking at a minimum. 04/24/2017: Today Patient reports that she is back to smoking.   Immunizations: T dap----------- given here 03/27/14 Pneumovax---- given here 05/23/13    Signed, Olean Ree [place], [place], [employer] 04/24/2017 4:25 PM

## 2017-04-25 ENCOUNTER — Other Ambulatory Visit: Payer: BLUE CROSS/BLUE SHIELD

## 2017-04-25 DIAGNOSIS — E785 Hyperlipidemia, unspecified: Secondary | ICD-10-CM

## 2017-04-25 DIAGNOSIS — E119 Type 2 diabetes mellitus without complications: Secondary | ICD-10-CM

## 2017-04-26 LAB — COMPLETE METABOLIC PANEL WITH GFR
AG Ratio: 2 (calc) (ref 1.0–2.5)
ALBUMIN MSPROF: 4.3 g/dL (ref 3.6–5.1)
ALT: 25 U/L (ref 6–29)
AST: 16 U/L (ref 10–35)
Alkaline phosphatase (APISO): 37 U/L (ref 33–115)
BUN: 16 mg/dL (ref 7–25)
CALCIUM: 9.1 mg/dL (ref 8.6–10.2)
CHLORIDE: 103 mmol/L (ref 98–110)
CO2: 24 mmol/L (ref 20–32)
CREATININE: 0.8 mg/dL (ref 0.50–1.10)
GFR, EST AFRICAN AMERICAN: 101 mL/min/{1.73_m2} (ref >=60)
GFR, EST NON AFRICAN AMERICAN: 87 mL/min/{1.73_m2} (ref >=60)
GLUCOSE: 259 mg/dL — AB (ref 65–99)
Globulin: 2.1 g/dL (calc) (ref 1.9–3.7)
Potassium: 4.3 mmol/L (ref 3.5–5.3)
Sodium: 137 mmol/L (ref 135–146)
TOTAL PROTEIN: 6.4 g/dL (ref 6.1–8.1)
Total Bilirubin: 0.6 mg/dL (ref 0.2–1.2)

## 2017-04-26 LAB — HEMOGLOBIN A1C
Hgb A1c MFr Bld: 8.6 % of total Hgb — ABNORMAL HIGH (ref <5.7)
Mean Plasma Glucose: 200 (calc)
eAG (mmol/L): 11.1 (calc)

## 2017-04-26 LAB — LIPID PANEL
Cholesterol: 169 mg/dL (ref <200)
HDL: 32 mg/dL — ABNORMAL LOW (ref >50)
NON-HDL CHOLESTEROL (CALC): 137 mg/dL — AB (ref <130)
TRIGLYCERIDES: 435 mg/dL — AB (ref <150)
Total CHOL/HDL Ratio: 5.3 (calc) — ABNORMAL HIGH (ref <5.0)

## 2017-04-26 LAB — MICROALBUMIN, URINE: Microalb, Ur: 4.4 mg/dL

## 2017-04-28 ENCOUNTER — Other Ambulatory Visit: Payer: Self-pay

## 2017-04-28 MED ORDER — SIMVASTATIN 20 MG PO TABS: ORAL_TABLET | ORAL | 1 refills | Status: DC

## 2017-04-28 MED ORDER — INSULIN GLARGINE 100 UNIT/ML ~~LOC~~ SOLN: 100.0000 [IU] | Freq: Every day | SUBCUTANEOUS | 11 refills | Status: DC

## 2017-04-28 MED ORDER — METFORMIN HCL 1000 MG PO TABS: 1000.0000 mg | ORAL_TABLET | Freq: Two times a day (BID) | ORAL | 1 refills | Status: DC

## 2017-07-26 ENCOUNTER — Ambulatory Visit (INDEPENDENT_AMBULATORY_CARE_PROVIDER_SITE_OTHER): Payer: BLUE CROSS/BLUE SHIELD | Admitting: Physician Assistant

## 2017-07-26 ENCOUNTER — Other Ambulatory Visit: Payer: Self-pay

## 2017-07-26 ENCOUNTER — Encounter: Payer: Self-pay | Admitting: Physician Assistant

## 2017-07-26 VITALS — BP 111/68 | HR 84 | Temp 97.8°F | Resp 18 | Ht 66.0 in | Wt 208.6 lb

## 2017-07-26 DIAGNOSIS — E782 Mixed hyperlipidemia: Secondary | ICD-10-CM

## 2017-07-26 DIAGNOSIS — E119 Type 2 diabetes mellitus without complications: Secondary | ICD-10-CM

## 2017-07-26 DIAGNOSIS — F17200 Nicotine dependence, unspecified, uncomplicated: Secondary | ICD-10-CM

## 2017-07-26 DIAGNOSIS — M6530 Trigger finger, unspecified finger: Secondary | ICD-10-CM

## 2017-07-26 MED ORDER — BUPROPION HCL ER (SR) 150 MG PO TB12: 150.0000 mg | ORAL_TABLET | Freq: Two times a day (BID) | ORAL | 5 refills | Status: DC

## 2017-07-26 NOTE — Progress Notes (Signed)
Patient ID: Camren Henthorn MRN: [id number], DOB: Apr 27, 1970, 47 y.o. Date of Encounter: @DATE @  Chief Complaint:  No chief complaint on file.   HPI: 47 y.o. year old white female  presents for routine followup.   She saw me for an office visit 11/22/12. Prior to that visit her last visit with our office had been August 2011. She had also reported in the past that she was seeing Dr. Isabel Caprice for her diabetes. She came to see me 11/22/12 asked her about this.  She stated that she had gotten frustrated because she felt like she kept adjusting medications for her blood sugar but they were never getting control.  Therefore she ended up stopping all of her medications and stopped all followup around 2010.  She reported that back then she was on insulin 70/30 at 30 units twice a day.  She had been using no diabetic treatment since 2010. Asked what prompted her to come in to restart treatment 11/22/12.  She stated that her fianc recently had dorsum of the amputation secondary to uncontrolled diabetes.  At visit 11/22/12 A1c was 9.9. Started Lantus at that time. Plan to check labs and creatinine normal at metformin.  She had followup with me 12/06/12. Metformin was added. She is then to continue to titrate her Lantus.  At [place] 01/2013 she reported that she was able to titrate the metformin up to 1000 mg twice a day. She was having no adverse effects and was tolerating this well.  At office visit January 2015 we added Invokana. She said that this made a huge difference in her blood sugars. Says it definitely decreasing quite a bit.  At  Visit 10/14 we checked FLP LFT. Recommended starting simvastatin 10 mg. She states that she is taking this as directed. She is having no myalgias or other adverse effects.   Also at her visit 11/25/2012 she reported that she did use Chantix in the past and it worked very well for her. She has stayed clear for 2-3 years but then back to smoking about 1-1/2  years ago.  At that visit I gave her another prescription of Chantix. At [place] 1/15 she stated that she finished the Chantix 2 weeks ago and has not smoked any for 71 days.  Said that she has no cravings to smoke. Said she had even been around smokers at times and has had no cravings. At [place] 05/23/13 she reports she has continued to stay off of the cigarettes and still is having absolutely no cravings. !!!!  Unfortunately, at [place] 12/2013--she says that she had quit smoking 12/01/2012. Says that she had gone to almost her one-year mark but then over Halloween weekend she went to a party. Smoked at that party. Bought a pack of cigarettes that following week. Has been smoking for these past couple of weeks. Wants to get back on Chantix so that she can stop smoking again. At [place] 12/18/2013--prescribed Chantix. At [place] 03/27/2014--says that she is still taking the Chantix. Says that she is off of cigarettes.  Says that even with the coupon the Chantix is very expensive. So says that she has gained 7 pounds since her last visit and thinks that a lot of that has to do with munching in place of smoking.  At [place] 10/01/2014--- says that she has stayed off of the cigarettes and is not smoking at all. Says that she is trying to stay away from her friends that do smoke.   She does no  exercise. SHe tries to be compliant with her diet. Says She actually has gotten to where she would prefer to eat an apple or carrots for snacks. She works at [employer] and has to work 12 hour shifts 3 days a week.  At [place] 10/01/2014--- she says that she has been using something called [employer]--says that she orders this on line. Says that it is a lot of protein. She drinks a shake in the morning and a shake around noon and then eats dinner meal. Says that she eats healthy snacks between so that she is taking in something about every 3 hours. Eats something like carrots with hummus. Drinks 1 gallon of water a day.  At office visit 03/28/14 she  states that she has an eye appointment later today. She says that her fianc who has diabetes as having a lot of problems with his eyes. She says that when she went for her eye exam last year they told her they did see some problems with her retina and told her to follow up with a retina specialist but she never did.  12/2013--A1c came back 6.4. On that lab results were told her to make sure she was getting no low readings. She says that she was getting some low readings in the night so she has decreased her Lantus dose since then. Says that she recently has not been checking her blood sugar on a daily basis anymore.  07/02/2014--she says that when she went to the eye appointment 03/27/14. Since then went to a retina specialist. Was given bilateral eye injections--For diabetic retinopathy. Has to repeat these injections every 4-6 weeks. Says that she is not smoking at all. Completed her Chantix 1 week ago. Says that she has gotten to the point that she wants to change her life and make lifestyle changes. Has been using juice cleanses and feels so much better with this. Is not eating the foods that she used to in the past anymore. Says that she has been able to decrease her Lantus from 100 down to 60 units. Says that the lowest sugar she gets is about 93--and that is fasting in the morning. No complaints or concerns at this visit.  At [place] 10/01/2014---  She says that she did "2 series of injections to the eye". Says that when she completed this, the Retina Specialist told her that they did not think she would need any further injections. Says she goes for follow-up in October. Says that she has remained off of cigarettes. Says that she is using [employer] which is something she ordered on line. It is high protein. She drinks one shake in the morning, 1 shake in the afternoon, and then eats a dinner meal. Eats healthy snacks between such as hummus and carrots. Drinks 1 gallon of water per day. Says that her  Lantus is still at 60 units--- but that if she knows she has eaten very low amount of carbs, then she may just use Lantus 50 units but if she eats spaghetti or something then she will use 60.  At [place] 10/01/2014:  She reports that on both hands:  her thumb and all fingers except for her pinky fingers: These feel numb and tingly when she wakes up in the morning. Says even when she shakes them out and tries to move them, the tingling does not resolve. Says that it is present when she wakes up in the morning and it takes about 1 hour for it to resolve. She does  not notice the symptoms during the day at other times. Says that she works for [employer]. --for past 1 1/2 years. Says that she is on the computer some but not all day. 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  She also states that she has been feeling pain in both of her elbows recently. Cannot think of any repetitive motion she has done with her arms recently.  At [place] 01/14/2015: She states that she started back smoking right after Thanksgiving. She was going to parties and around friends who were smoking. However says that she got a new car so she is not smoking in the car. Is only smoking 2 or 3 cigarettes per day. Wants to get back on Chantix so she can quit completely.  Says that she recently had pain in her left shoulder blade. Says that she has applied muscle rub to that has gotten better. Says also she has had some pain up in the left neck. Says now her left anterior chest sore with palpation. Says that she is left-handed. Says that she has been getting out Christmas stuff and moving big boxes etc.  Says that she checks blood sugar every morning. Says this morning was 106. Says the highest has been 140 and it just depends on what she ate the night before.  She is taking her statin as directed. No myalgias or other adverse effects.  At [place] 04/15/2015: She reports that the savings card/coupon ran out on her Chantix as well as  Invokana,. She reports that without the savings cards both of these were very expensive. Therefore she has stopped both of these medications. Says that she is smoking about 6-7 cigarettes per day. Says that even without the invokana, her fasting blood sugars are 110-120. Says that she has not had to increase her insulin and has cut that dose the same. States that her boyfriend is having to have bypass surgery--- says they have been eating extremely healthy--- says this is probably helping regarding keeping her sugars down despite stopping the Invokana.  She states that with her job she is walking constantly all day. Sometimes has to carry boxes up steps. Is having no chest pressure heaviness tightness or squeezing in no increased shortness of breath with exertion. Has not had any follow-up with eye doctor since last visit. No other complaints or concerns or updates.  [place] 07/16/2015: NOTE: At [place] 07/16/2015 she reported she had kicked out her boyfriend--"he wasn't taking care of himself, couldn't sit there and watch him die' However, since he has been gone, she has fond herself eating worse. Says she thinks when he was there, she felt like she needed to be "the good example". Now she is only accountable to herself. Also finding it difficult to cook for just one person. However, not getting back with him, she says. She says Wellbutrin has helped a lot with smoking. Currently just smoking 1 -2 cigarettes on weekends with friends. "Is done seeing Retina specialist" Due for regular eye exam in August.  Went to dentist this morning.    01/21/2016: She states that after her last visit with me in November I gave her an antibiotic but told her to only fill it if she had certain symptoms. States that she never did take the antibiotic. States that she continues to have a nonproductive hacky cough. Says that it's like a tickle in her throat and thinks that she can cough something up but then never did  can. Currently  is smoking about 4 or 5 cigarettes per day. Is taking the Wellbutrin and this is causing no adverse effects and is helping to decrease her cravings for cigarettes. Says that a couple of weeks ago her sugar kept running higher than usual for some reason (was not eating different than usual) but has come back down in the last week or so. Says that she has stayed broken up with her boyfriend and has not gotten back with him. Says that she is doing a little better with her diet than she was at the last visit. She has a cousin that has a restaurant in [place] and they're able to go up to the roof top at [employer] for [employer]. Also at her last visit she had told me that she was going to [place] for the [employer] Thanksgiving parade. Was meeting multiple family members for that. She has no other specific concerns to address today. She is taking metformin and insulin as directed. No adverse effects. No blood low blood sugar readings. She is taking simvastatin as directed. No myalgias or other adverse effects.   04/24/2017: Patient states that she is off of all of her medicines.   Says that she has been out of her insulin for over 6 weeks and has been off of all of the other medicines for about a year.   She also states that she is back to smoking.   She is not fasting today but states that she can return tomorrow morning fasting for labs. She does not really have any reason/explanation for this noncompliance.   07/26/2017: After LOV, she did return for labs on 04/25/2017.  Micro-albumin was within normal range at 4.4.  CMET was normal other than high glucose.  Lipid panel showed triglyceride 435 and HDL 32.  A1c was 8.6. She was instructed to restart the metformin insulin and simvastatin at prior doses.  Come for follow-up visit in 3 months.  Come fasting to that visit. Today she reports that she did restart the insulin metformin and Zocor. States that she is not on the Wellbutrin  but does want to get back on it. Reports that she is smoking about 1/2 pack/day right now. States that for the insulin she is administering this at about 9 PM or 10 PM.  Fasting blood sugars are usually in the 110 -- 140 range. She states she had one other issue she wanted to address.  Says that for a while now she has noticed that her right fourth finger gets stuck in place and is difficult to bend and straighten at times.  Says that now recently she is having the same problem in the left fourth finger. No other concerns to address.     Past Medical History:  Diagnosis Date  . Depression   . Diabetes mellitus without complication ([place])   . Hyperlipidemia 02/13/2013  . Smoker      Home Meds:  Outpatient Medications Prior to Visit  Medication Sig Dispense Refill  . buPROPion (WELLBUTRIN SR) 150 MG 12 hr tablet TAKE ONE TABLET BY MOUTH TWICE DAILY (Patient not taking: Reported on 04/24/2017) 60 tablet 3  . fluticasone (FLONASE) 50 MCG/ACT nasal spray Place 2 sprays into both nostrils daily. (Patient not taking: Reported on 01/21/2016) 16 g 6  . insulin glargine (LANTUS) 100 UNIT/ML injection Inject 1 mL (100 Units total) into the skin at bedtime. 3 vial 11  . Insulin Syringe-Needle U-100 25G X 1" 1 ML MISC Use as  directed 100 each 3  . metFORMIN (GLUCOPHAGE) 1000 MG tablet Take 1 tablet (1,000 mg total) by mouth 2 (two) times daily with a meal. 180 tablet 1  . Multiple Vitamin (MULTIVITAMIN) tablet Take 1 tablet by mouth daily.    . simvastatin (ZOCOR) 20 MG tablet TAKE ONE TABLET BY MOUTH ONCE DAILY AT  6PM 90 tablet 1   No facility-administered medications prior to visit.      Allergies: No Known Allergies  Social History   Socioeconomic History  . Marital status: Married    Spouse name: Not on file  . Number of children: Not on file  . Years of education: Not on file  . Highest education level: Not on file  Occupational History  . Not on file  Social Needs  . Financial  resource strain: Not on file  . Food insecurity:    Worry: Not on file    Inability: Not on file  . Transportation needs:    Medical: Not on file    Non-medical: Not on file  Tobacco Use  . Smoking status: Current Every Day Smoker    Packs/day: 0.25    Types: Cigarettes  . Smokeless tobacco: Never Used  . Tobacco comment: has been trying to quit  Substance and Sexual Activity  . Alcohol use: No    Alcohol/week: 0.0 oz  . Drug use: No  . Sexual activity: Yes    Birth control/protection: None  Lifestyle  . Physical activity:    Days per week: Not on file    Minutes per session: Not on file  . Stress: Not on file  Relationships  . Social connections:    Talks on phone: Not on file    Gets together: Not on file    Attends religious service: Not on file    Active member of club or organization: Not on file    Attends meetings of clubs or organizations: Not on file    Relationship status: Not on file  . Intimate partner violence:    Fear of current or ex partner: Not on file    Emotionally abused: Not on file    Physically abused: Not on file    Forced sexual activity: Not on file  Other Topics Concern  . Not on file  Social History Narrative  . Not on file    Family History  Problem Relation Age of Onset  . Diabetes Father   . Cancer Neg Hx   . Heart disease Neg Hx      Review of Systems:  See HPI for pertinent ROS. All other ROS negative.     Physical Exam: Blood pressure 111/68, pulse 84, temperature 97.8 F (36.6 C), temperature source Oral, resp. rate 18, height 5\' 6"  (1.676 m), weight 94.6 kg (208 lb 9.6 oz), last menstrual period 04/25/2013, SpO2 95 %., Body mass index is 33.67 kg/m. General: WNWD WF. Appears in no acute distress. Neck: Supple. No thyromegaly. No lymphadenopathy.  No carotid bruit. Lungs: Clear bilaterally to auscultation without wheezes, rales, or rhonchi. Breathing is unlabored. Heart: RRR with S1 S2. No murmurs, rubs, or  gallops. Abdomen: Soft, non-tender, non-distended with normoactive bowel sounds. No hepatomegaly. No rebound/guarding. No obvious abdominal masses. Musculoskeletal:  Strength and tone normal for age.  Trigger finger on the right fourth finger and the left fourth finger.  She tries to flex and extend these fingers get locked in position and then pop into movement. Extremities/Skin: Warm and dry.  No LE edema.  Neuro: Alert and oriented X 3. Moves all extremities spontaneously. Gait is normal. CNII-XII grossly in tact. Psych:  Responds to questions appropriately with a normal affect.    Diabetic foot exam: inspection is normal. 2+ bilateral dorsalis pedis and posterior tibial pulses. Sensation is intact bilaterally.     ASSESSMENT AND PLAN:  47 y.o. year old female with   1. Diabetes mellitus without complication 5/64/3329: At last visit she was on statin.  At prior visits she had reported having routine eye exams and was educated regarding proper foot care.  Will she definitely knows information regarding low carbohydrate diet and proper diet.  She reports that she has been off of insulin for a little over 6 weeks and off all other medicines for about 1 year.  She will return tomorrow morning fasting for labs. 07/26/2017: She is now on her metformin and Lantus.  Recheck A1c now to monitor. She is on statin. Reviewed that she is not on an ACE inhibitor or an ARB.  However blood pressure is 111/68.  Will add ACE or ARB in future if BP increases. Will discuss adding aspirin at next visit Will discuss diabetic eye exam at next visit I do not want her to get overwhelmed or frustrated so we will hold off.  2. Hyperlipidemia 04/24/2017: At ime of last visit with me she was on simvastatin.  ------------- Today she reports that she has been off medication for about a year.  She will return fasting for labs tomorrow. 07/26/2017: She did restart Zocor 20 mg.  She is fasting today.  [place] lab.  3.  Smoker At visit 04/15/15 discussed using Wellbutrin to help with smoking cessation. She states that she has never used this but is agreeable to try this. She is to take 1 daily for the first 5 days then increase to 1 twice daily. If any adverse effects and stop the medicine and call us. At [place] 07/16/15--says wellbutrin has really helped--only smoking 1-2 cigarettes on weekends 01/21/2016--she is still on the Wellbutrin. She is smoking 4 or 5 cigarettes per day. Continue current dose of Wellbutrin. Told her to try to work on keeping smoking at a minimum. 04/24/2017: Today Patient reports that she is back to smoking. 07/26/2017: She is currently smoking about 1/2 pack/day.  She has not restarted Wellbutrin but wants to get back on this.  Rx is sent today.  4. Trigger finger, acquired She has trigger finger on the right fourth finger and left fourth finger.  Will refer to a hand specialist either Dr. Amedeo Plenty or Dr. Apolonio Schneiders. - AMB referral to orthopedics    Immunizations: T dap----------- given here 03/27/14 Pneumovax---- given here 05/23/13    Signed, Karis Juba, PA, BSFM 07/26/2017 8:14 AM

## 2017-07-27 LAB — LIPID PANEL
CHOL/HDL RATIO: 4.8 (calc) (ref <5.0)
Cholesterol: 145 mg/dL (ref <200)
HDL: 30 mg/dL — AB (ref >50)
LDL CHOLESTEROL (CALC): 76 mg/dL
NON-HDL CHOLESTEROL (CALC): 115 mg/dL (ref <130)
Triglycerides: 325 mg/dL — ABNORMAL HIGH (ref <150)

## 2017-07-27 LAB — COMPLETE METABOLIC PANEL WITH GFR
AG Ratio: 1.8 (calc) (ref 1.0–2.5)
ALBUMIN MSPROF: 4.4 g/dL (ref 3.6–5.1)
ALT: 43 U/L — ABNORMAL HIGH (ref 6–29)
AST: 25 U/L (ref 10–35)
Alkaline phosphatase (APISO): 43 U/L (ref 33–115)
BUN: 18 mg/dL (ref 7–25)
CALCIUM: 9.5 mg/dL (ref 8.6–10.2)
CO2: 28 mmol/L (ref 20–32)
CREATININE: 0.75 mg/dL (ref 0.50–1.10)
Chloride: 103 mmol/L (ref 98–110)
GFR, EST NON AFRICAN AMERICAN: 94 mL/min/{1.73_m2} (ref >=60)
GFR, Est African American: 109 mL/min/{1.73_m2} (ref >=60)
GLOBULIN: 2.5 g/dL (ref 1.9–3.7)
GLUCOSE: 177 mg/dL — AB (ref 65–99)
Potassium: 4.3 mmol/L (ref 3.5–5.3)
SODIUM: 139 mmol/L (ref 135–146)
Total Bilirubin: 0.5 mg/dL (ref 0.2–1.2)
Total Protein: 6.9 g/dL (ref 6.1–8.1)

## 2017-07-27 LAB — HEMOGLOBIN A1C
HEMOGLOBIN A1C: 7.1 %{Hb} — AB (ref <5.7)
Mean Plasma Glucose: 157 (calc)
eAG (mmol/L): 8.7 (calc)

## 2017-08-16 ENCOUNTER — Other Ambulatory Visit: Payer: Self-pay

## 2017-08-16 MED ORDER — SIMVASTATIN 20 MG PO TABS: ORAL_TABLET | ORAL | 1 refills | Status: DC

## 2017-10-16 ENCOUNTER — Ambulatory Visit (INDEPENDENT_AMBULATORY_CARE_PROVIDER_SITE_OTHER): Payer: BLUE CROSS/BLUE SHIELD | Admitting: Physician Assistant

## 2017-10-16 ENCOUNTER — Encounter: Payer: Self-pay | Admitting: Physician Assistant

## 2017-10-16 VITALS — BP 130/76 | HR 85 | Temp 98.0°F | Resp 16 | Ht 66.0 in | Wt 209.2 lb

## 2017-10-16 DIAGNOSIS — E785 Hyperlipidemia, unspecified: Secondary | ICD-10-CM | POA: Diagnosis not present

## 2017-10-16 DIAGNOSIS — F17200 Nicotine dependence, unspecified, uncomplicated: Secondary | ICD-10-CM

## 2017-10-16 DIAGNOSIS — E119 Type 2 diabetes mellitus without complications: Secondary | ICD-10-CM

## 2017-10-16 NOTE — Progress Notes (Signed)
Patient ID: Mckenzie Tyler MRN: [id number], DOB: 1971-01-26, 47 y.o. Date of Encounter: @DATE @  Chief Complaint:  Chief Complaint  Patient presents with  . Diabetes    HPI: 47 y.o. year old white female  presents for routine followup.   She saw me for an office visit 11/22/12. Prior to that visit her last visit with our office had been August 2011. She had also reported in the past that she was seeing Dr. Isabel Caprice for her diabetes. She came to see me 11/22/12 asked her about this.  She stated that she had gotten frustrated because she felt like she kept adjusting medications for her blood sugar but they were never getting control.  Therefore she ended up stopping all of her medications and stopped all followup around 2010.  She reported that back then she was on insulin 70/30 at 30 units twice a day.  She had been using no diabetic treatment since 2010. Asked what prompted her to come in to restart treatment 11/22/12.  She stated that her fianc recently had dorsum of the amputation secondary to uncontrolled diabetes.  At visit 11/22/12 A1c was 9.9. Started Lantus at that time. Plan to check labs and creatinine normal at metformin.  She had followup with me 12/06/12. Metformin was added. She is then to continue to titrate her Lantus.  At [place] 01/2013 she reported that she was able to titrate the metformin up to 1000 mg twice a day. She was having no adverse effects and was tolerating this well.  At office visit January 2015 we added Invokana. She said that this made a huge difference in her blood sugars. Says it definitely decreasing quite a bit.  At  Visit 10/14 we checked FLP LFT. Recommended starting simvastatin 10 mg. She states that she is taking this as directed. She is having no myalgias or other adverse effects.   Also at her visit 11/25/2012 she reported that she did use Chantix in the past and it worked very well for her. She has stayed clear for 2-3 years but then back  to smoking about 1-1/2 years ago.  At that visit I gave her another prescription of Chantix. At [place] 1/15 she stated that she finished the Chantix 2 weeks ago and has not smoked any for 71 days.  Said that she has no cravings to smoke. Said she had even been around smokers at times and has had no cravings. At [place] 05/23/13 she reports she has continued to stay off of the cigarettes and still is having absolutely no cravings. !!!!  Unfortunately, at [place] 12/2013--she says that she had quit smoking 12/01/2012. Says that she had gone to almost her one-year mark but then over Halloween weekend she went to a party. Smoked at that party. Bought a pack of cigarettes that following week. Has been smoking for these past couple of weeks. Wants to get back on Chantix so that she can stop smoking again. At [place] 12/18/2013--prescribed Chantix. At [place] 03/27/2014--says that she is still taking the Chantix. Says that she is off of cigarettes.  Says that even with the coupon the Chantix is very expensive. So says that she has gained 7 pounds since her last visit and thinks that a lot of that has to do with munching in place of smoking.  At [place] 49/31/2016--- says that she has stayed off of the cigarettes and is not smoking at all. Says that she is trying to stay away from her friends that do smoke.  She does no exercise. SHe tries to be compliant with her diet. Says She actually has gotten to where she would prefer to eat an apple or carrots for snacks. She works at [employer] and has to work 12 hour shifts 3 days a week.  At [place] 10/01/2014--- she says that she has been using something called [employer]--says that she orders this on line. Says that it is a lot of protein. She drinks a shake in the morning and a shake around noon and then eats dinner meal. Says that she eats healthy snacks between so that she is taking in something about every 3 hours. Eats something like carrots with hummus. Drinks 1 gallon of water a day.  At  office visit 03/28/14 she states that she has an eye appointment later today. She says that her fianc who has diabetes as having a lot of problems with his eyes. She says that when she went for her eye exam last year they told her they did see some problems with her retina and told her to follow up with a retina specialist but she never did.  12/2013--A1c came back 6.4. On that lab results were told her to make sure she was getting no low readings. She says that she was getting some low readings in the night so she has decreased her Lantus dose since then. Says that she recently has not been checking her blood sugar on a daily basis anymore.  07/02/2014--she says that when she went to the eye appointment 03/27/14. Since then went to a retina specialist. Was given bilateral eye injections--For diabetic retinopathy. Has to repeat these injections every 4-6 weeks. Says that she is not smoking at all. Completed her Chantix 1 week ago. Says that she has gotten to the point that she wants to change her life and make lifestyle changes. Has been using juice cleanses and feels so much better with this. Is not eating the foods that she used to in the past anymore. Says that she has been able to decrease her Lantus from 100 down to 60 units. Says that the lowest sugar she gets is about 93--and that is fasting in the morning. No complaints or concerns at this visit.  At [place] 10/01/2014---  She says that she did "2 series of injections to the eye". Says that when she completed this, the Retina Specialist told her that they did not think she would need any further injections. Says she goes for follow-up in October. Says that she has remained off of cigarettes. Says that she is using [employer] which is something she ordered on line. It is high protein. She drinks one shake in the morning, 1 shake in the afternoon, and then eats a dinner meal. Eats healthy snacks between such as hummus and carrots. Drinks 1 gallon of water  per day. Says that her Lantus is still at 60 units--- but that if she knows she has eaten very low amount of carbs, then she may just use Lantus 50 units but if she eats spaghetti or something then she will use 60.  At [place] 10/01/2014:  She reports that on both hands:  her thumb and all fingers except for her pinky fingers: These feel numb and tingly when she wakes up in the morning. Says even when she shakes them out and tries to move them, the tingling does not resolve. Says that it is present when she wakes up in the morning and it takes about 1 hour for it to  resolve. She does not notice the symptoms during the day at other times. Says that she works for [employer]. --for past 1 1/2 years. Says that she is on the computer some but not all day. 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  She also states that she has been feeling pain in both of her elbows recently. Cannot think of any repetitive motion she has done with her arms recently.  At [place] 01/14/2015: She states that she started back smoking right after Thanksgiving. She was going to parties and around friends who were smoking. However says that she got a new car so she is not smoking in the car. Is only smoking 2 or 3 cigarettes per day. Wants to get back on Chantix so she can quit completely.  Says that she recently had pain in her left shoulder blade. Says that she has applied muscle rub to that has gotten better. Says also she has had some pain up in the left neck. Says now her left anterior chest sore with palpation. Says that she is left-handed. Says that she has been getting out Christmas stuff and moving big boxes etc.  Says that she checks blood sugar every morning. Says this morning was 106. Says the highest has been 140 and it just depends on what she ate the night before.  She is taking her statin as directed. No myalgias or other adverse effects.  At [place] 04/15/2015: She reports that the savings card/coupon ran out on her  Chantix as well as Invokana,. She reports that without the savings cards both of these were very expensive. Therefore she has stopped both of these medications. Says that she is smoking about 6-7 cigarettes per day. Says that even without the invokana, her fasting blood sugars are 110-120. Says that she has not had to increase her insulin and has cut that dose the same. States that her boyfriend is having to have bypass surgery--- says they have been eating extremely healthy--- says this is probably helping regarding keeping her sugars down despite stopping the Invokana.  She states that with her job she is walking constantly all day. Sometimes has to carry boxes up steps. Is having no chest pressure heaviness tightness or squeezing in no increased shortness of breath with exertion. Has not had any follow-up with eye doctor since last visit. No other complaints or concerns or updates.  [place] 07/16/2015: NOTE: At [place] 07/16/2015 she reported she had kicked out her boyfriend--"he wasn't taking care of himself, couldn't sit there and watch him die' However, since he has been gone, she has fond herself eating worse. Says she thinks when he was there, she felt like she needed to be "the good example". Now she is only accountable to herself. Also finding it difficult to cook for just one person. However, not getting back with him, she says. She says Wellbutrin has helped a lot with smoking. Currently just smoking 1 -2 cigarettes on weekends with friends. "Is done seeing Retina specialist" Due for regular eye exam in August.  Went to dentist this morning.    01/21/2016: She states that after her last visit with me in November I gave her an antibiotic but told her to only fill it if she had certain symptoms. States that she never did take the antibiotic. States that she continues to have a nonproductive hacky cough. Says that it's like a tickle in her throat and thinks that she can cough something up but then  never  did can. Currently is smoking about 4 or 5 cigarettes per day. Is taking the Wellbutrin and this is causing no adverse effects and is helping to decrease her cravings for cigarettes. Says that a couple of weeks ago her sugar kept running higher than usual for some reason (was not eating different than usual) but has come back down in the last week or so. Says that she has stayed broken up with her boyfriend and has not gotten back with him. Says that she is doing a little better with her diet than she was at the last visit. She has a cousin that has a restaurant in [place] and they're able to go up to the roof top at [employer] for [employer]. Also at her last visit she had told me that she was going to [place] for the [employer] Thanksgiving parade. Was meeting multiple family members for that. She has no other specific concerns to address today. She is taking metformin and insulin as directed. No adverse effects. No blood low blood sugar readings. She is taking simvastatin as directed. No myalgias or other adverse effects.   04/24/2017: Patient states that she is off of all of her medicines.   Says that she has been out of her insulin for over 6 weeks and has been off of all of the other medicines for about a year.   She also states that she is back to smoking.   She is not fasting today but states that she can return tomorrow morning fasting for labs. She does not really have any reason/explanation for this noncompliance.   07/26/2017: After LOV, she did return for labs on 04/25/2017.  Micro-albumin was within normal range at 4.4.  CMET was normal other than high glucose.  Lipid panel showed triglyceride 435 and HDL 32.  A1c was 8.6. She was instructed to restart the metformin insulin and simvastatin at prior doses.  Come for follow-up visit in 3 months.  Come fasting to that visit. Today she reports that she did restart the insulin metformin and Zocor. States that she is not on the  Wellbutrin but does want to get back on it. Reports that she is smoking about 1/2 pack/day right now. States that for the insulin she is administering this at about 9 PM or 10 PM.  Fasting blood sugars are usually in the 110 -- 140 range. She states she had one other issue she wanted to address.  Says that for a while now she has noticed that her right fourth finger gets stuck in place and is difficult to bend and straighten at times.  Says that now recently she is having the same problem in the left fourth finger. No other concerns to address.   10/16/2017: She reports that her allergies are bothering her some.  However states that she is because she did not take her Allegra this morning and generally if she takes her Allegra that keeps it pretty well controlled.  Is that she has both Allegra and Flonase to use.  Says that her allergies have gotten stirred up because she has been cleaning out her basement recently. Other specific concerns today.  Otherwise has been feeling pretty stable. Reviewed that her labs came back looking excellent at last check 07/26/2017.  A1c was 7.1.  LDL was 76. For diabetes she is taking her metformin and her insulin. She is taking her Zocor.  This is causing no myalgias or other adverse effects. She is back on  her Wellbutrin and this is working well.        Past Medical History:  Diagnosis Date  . Depression   . Diabetes mellitus without complication ([place])   . Hyperlipidemia 02/13/2013  . Smoker      Home Meds:  Outpatient Medications Prior to Visit  Medication Sig Dispense Refill  . buPROPion (WELLBUTRIN SR) 150 MG 12 hr tablet Take 1 tablet (150 mg total) by mouth 2 (two) times daily. 60 tablet 5  . fluticasone (FLONASE) 50 MCG/ACT nasal spray Place 2 sprays into both nostrils daily. 16 g 6  . insulin glargine (LANTUS) 100 UNIT/ML injection Inject 1 mL (100 Units total) into the skin at bedtime. 3 vial 11  . Insulin Syringe-Needle U-100 25G X 1" 1 ML  MISC Use as directed 100 each 3  . metFORMIN (GLUCOPHAGE) 1000 MG tablet Take 1 tablet (1,000 mg total) by mouth 2 (two) times daily with a meal. 180 tablet 1  . Multiple Vitamin (MULTIVITAMIN) tablet Take 1 tablet by mouth daily.    . simvastatin (ZOCOR) 20 MG tablet TAKE ONE TABLET BY MOUTH ONCE DAILY AT  6PM 90 tablet 1   No facility-administered medications prior to visit.      Allergies: No Known Allergies  Social History   Socioeconomic History  . Marital status: Married    Spouse name: Not on file  . Number of children: Not on file  . Years of education: Not on file  . Highest education level: Not on file  Occupational History  . Not on file  Social Needs  . Financial resource strain: Not on file  . Food insecurity:    Worry: Not on file    Inability: Not on file  . Transportation needs:    Medical: Not on file    Non-medical: Not on file  Tobacco Use  . Smoking status: Current Every Day Smoker    Packs/day: 0.25    Types: Cigarettes  . Smokeless tobacco: Never Used  . Tobacco comment: has been trying to quit  Substance and Sexual Activity  . Alcohol use: No    Alcohol/week: 0.0 standard drinks  . Drug use: No  . Sexual activity: Yes    Birth control/protection: None  Lifestyle  . Physical activity:    Days per week: Not on file    Minutes per session: Not on file  . Stress: Not on file  Relationships  . Social connections:    Talks on phone: Not on file    Gets together: Not on file    Attends religious service: Not on file    Active member of club or organization: Not on file    Attends meetings of clubs or organizations: Not on file    Relationship status: Not on file  . Intimate partner violence:    Fear of current or ex partner: Not on file    Emotionally abused: Not on file    Physically abused: Not on file    Forced sexual activity: Not on file  Other Topics Concern  . Not on file  Social History Narrative  . Not on file    Family History   Problem Relation Age of Onset  . Diabetes Father   . Cancer Neg Hx   . Heart disease Neg Hx      Review of Systems:  See HPI for pertinent ROS. All other ROS negative.     Physical Exam: Blood pressure 130/76, pulse 85, temperature 98 F (36.7  C), temperature source Oral, resp. rate 16, height 5\' 6"  (1.676 m), weight 94.9 kg, last menstrual period 04/25/2013, SpO2 97 %., Body mass index is 33.77 kg/m. General: WF. Appears in no acute distress. Neck: Supple. No thyromegaly. No lymphadenopathy. No carotid bruits.  Lungs: Clear bilaterally to auscultation without wheezes, rales, or rhonchi. Breathing is unlabored. Heart: RRR with S1 S2. No murmurs, rubs, or gallops. Abdomen: Soft, non-tender, non-distended with normoactive bowel sounds. No hepatomegaly. No rebound/guarding. No obvious abdominal masses. Musculoskeletal:  Strength and tone normal for age. Extremities/Skin: Warm and dry. No LE edema.  Neuro: Alert and oriented X 3. Moves all extremities spontaneously. Gait is normal. CNII-XII grossly in tact. Psych:  Responds to questions appropriately with a normal affect.    Diabetic foot exam: inspection is normal. 2+ bilateral dorsalis pedis and posterior tibial pulses. Sensation is intact bilaterally.     ASSESSMENT AND PLAN:  47 y.o. year old female with   1. Diabetes mellitus without complication 1/61/0960: At last visit she was on statin.  At prior visits she had reported having routine eye exams and was educated regarding proper foot care.  Will she definitely knows information regarding low carbohydrate diet and proper diet.  She reports that she has been off of insulin for a little over 6 weeks and off all other medicines for about 1 year.  She will return tomorrow morning fasting for labs. 07/26/2017: She is now on her metformin and Lantus.  Recheck A1c now to monitor. She is on statin. Reviewed that she is not on an ACE inhibitor or an ARB.  However blood pressure is  111/68.  Will add ACE or ARB in future if BP increases. Will discuss adding aspirin at next visit Will discuss diabetic eye exam at next visit I do not want her to get overwhelmed or frustrated so we will hold off.  10/16/2017: Recheck A1c.  2. Hyperlipidemia 04/24/2017: At ime of last visit with me she was on simvastatin.  ------------- Today she reports that she has been off medication for about a year.  She will return fasting for labs tomorrow. 07/26/2017: She did restart Zocor 20 mg.  She is fasting today.  [facility] lab. 10/16/2017: Lipid Panel on 07/26/2017 showed LDL 76.  Continue current dose of simvastatin.  3. Smoker At visit 04/15/15 discussed using Wellbutrin to help with smoking cessation. She states that she has never used this but is agreeable to try this. She is to take 1 daily for the first 5 days then increase to 1 twice daily. If any adverse effects and stop the medicine and call us. At [place] 07/16/15--says wellbutrin has really helped--only smoking 1-2 cigarettes on weekends 01/21/2016--she is still on the Wellbutrin. She is smoking 4 or 5 cigarettes per day. Continue current dose of Wellbutrin. Told her to try to work on keeping smoking at a minimum. 04/24/2017: Today Patient reports that she is back to smoking. 07/26/2017: She is currently smoking about 1/2 pack/day.  She has not restarted Wellbutrin but wants to get back on this.  Rx is sent today. 10/16/2017: Restarted Wellbutrin at visit 07/26/2017.  4. Trigger finger, acquired 07/26/2017: She has trigger finger on the right fourth finger and left fourth finger.  Will refer to a hand specialist either Dr. Amedeo Plenty or Dr. Apolonio Schneiders. - AMB referral to orthopedics    Immunizations: T dap----------- given here 03/27/14 Pneumovax---- given here 05/23/13    Signed, Olean Ree [place], [place], [facility] 10/16/2017 9:36 AM

## 2017-10-17 LAB — HEMOGLOBIN A1C
EAG (MMOL/L): 8.7 (calc)
Hgb A1c MFr Bld: 7.1 % of total Hgb — ABNORMAL HIGH (ref <5.7)
Mean Plasma Glucose: 157 (calc)

## 2017-10-18 ENCOUNTER — Ambulatory Visit: Payer: BLUE CROSS/BLUE SHIELD | Admitting: Physician Assistant

## 2017-11-14 LAB — HM DIABETES EYE EXAM

## 2017-11-16 HISTORY — PX: RETINOPATHY SURGERY: SHX765

## 2017-12-05 ENCOUNTER — Telehealth: Payer: Self-pay

## 2017-12-05 NOTE — Telephone Encounter (Signed)
Patient called and left a message requesting PCP stop Wellbutrin and start  patient on chantix. I sent patient a message via mychart as well as called requesting she contact our office to schedule an appointment to be seen.

## 2017-12-19 ENCOUNTER — Encounter: Payer: Self-pay | Admitting: Physician Assistant

## 2018-01-15 ENCOUNTER — Ambulatory Visit: Payer: BLUE CROSS/BLUE SHIELD | Admitting: Physician Assistant

## 2018-01-16 ENCOUNTER — Ambulatory Visit (INDEPENDENT_AMBULATORY_CARE_PROVIDER_SITE_OTHER): Payer: BLUE CROSS/BLUE SHIELD | Admitting: Family Medicine

## 2018-01-16 ENCOUNTER — Encounter: Payer: Self-pay | Admitting: Family Medicine

## 2018-01-16 VITALS — BP 142/70 | HR 96 | Temp 97.9°F | Resp 14 | Ht 66.0 in | Wt 211.0 lb

## 2018-01-16 DIAGNOSIS — E785 Hyperlipidemia, unspecified: Secondary | ICD-10-CM

## 2018-01-16 DIAGNOSIS — F17200 Nicotine dependence, unspecified, uncomplicated: Secondary | ICD-10-CM | POA: Diagnosis not present

## 2018-01-16 DIAGNOSIS — I10 Essential (primary) hypertension: Secondary | ICD-10-CM | POA: Diagnosis not present

## 2018-01-16 DIAGNOSIS — E118 Type 2 diabetes mellitus with unspecified complications: Secondary | ICD-10-CM

## 2018-01-16 DIAGNOSIS — L84 Corns and callosities: Secondary | ICD-10-CM

## 2018-01-16 DIAGNOSIS — Z794 Long term (current) use of insulin: Secondary | ICD-10-CM

## 2018-01-16 MED ORDER — VARENICLINE TARTRATE 0.5 MG X 11 & 1 MG X 42 PO MISC: ORAL | 0 refills | Status: DC

## 2018-01-16 NOTE — Progress Notes (Signed)
Subjective:    Patient ID: Mckenzie Tyler, female    DOB: August 16, 1970, 47 y.o.   MRN: [id number]  HPI Patient is a 47 year old Caucasian female here today for checkup.  She has type 2 diabetes mellitus but is insulin-dependent.  She is currently on Lantus.  She takes a varying dose every night based on her sugars.  She guesstimates that she averages 70 units at night.  Her last hemoglobin A1c was fairly well controlled at 7.1.  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  She also sees fluctuating blood sugars.  So while her average is well controlled, her blood pressures fluctuate quite a bit throughout the day.  We spent more than 20 minutes today discussing options including mealtime insulin with fast acting insulin as a correction rather than varying amounts of basal insulin which I think would work better or possibly adding a GLP-1 such as Victoza or Trulicity.  Patient is very interested in [place] with Trulicity.  She also is interested in smoking cessation.  She has had success in the past with Chantix and would like to try it again.  She denies any chest pain shortness of breath or dyspnea on exertion.  I performed her diabetic foot exam and while her pulses and her sensation are normal, the patient has pre-ulcerative calluses forming on the medial surface of her great toes bilaterally Past Medical History:  Diagnosis Date  . Depression   . Diabetes mellitus without complication ([place])   . Hyperlipidemia 02/13/2013  . Smoker    No past surgical history on file. Current Outpatient Medications on File Prior to Visit  Medication Sig Dispense Refill  . insulin glargine (LANTUS) 100 UNIT/ML injection Inject 1 mL (100 Units total) into the skin at bedtime. 3 vial 11  . Insulin Syringe-Needle U-100 25G X 1" 1 ML MISC Use as directed 100 each 3  . metFORMIN (GLUCOPHAGE) 1000 MG tablet Take 1 tablet (1,000 mg total) by mouth 2 (two)  times daily with a meal. 180 tablet 1  . Multiple Vitamin (MULTIVITAMIN) tablet Take 1 tablet by mouth daily.    . simvastatin (ZOCOR) 20 MG tablet TAKE ONE TABLET BY MOUTH ONCE DAILY AT  6PM 90 tablet 1   No current facility-administered medications on file prior to visit.    No Known Allergies Social History   Socioeconomic History  . Marital status: Married    Spouse name: Not on file  . Number of children: Not on file  . Years of education: Not on file  . Highest education level: Not on file  Occupational History  . Not on file  Social Needs  . Financial resource strain: Not on file  . Food insecurity:    Worry: Not on file    Inability: Not on file  . Transportation needs:    Medical: Not on file    Non-medical: Not on file  Tobacco Use  . Smoking status: Current Every Day Smoker    Packs/day: 0.25    Types: Cigarettes  . Smokeless tobacco: Never Used  . Tobacco comment: has been trying to quit  Substance and Sexual Activity  . Alcohol use: No    Alcohol/week: 0.0 standard drinks  . Drug use: No  . Sexual activity: Yes    Birth control/protection: None  Lifestyle  . Physical activity:    Days per week: Not on file    Minutes per session: Not on file  .  Stress: Not on file  Relationships  . Social connections:    Talks on phone: Not on file    Gets together: Not on file    Attends religious service: Not on file    Active member of club or organization: Not on file    Attends meetings of clubs or organizations: Not on file    Relationship status: Not on file  . Intimate partner violence:    Fear of current or ex partner: Not on file    Emotionally abused: Not on file    Physically abused: Not on file    Forced sexual activity: Not on file  Other Topics Concern  . Not on file  Social History Narrative  . Not on file      Review of Systems  All other systems reviewed and are negative.      Objective:   Physical Exam Vitals signs reviewed.    Constitutional:      General: She is not in acute distress.    Appearance: She is not ill-appearing.  Cardiovascular:     Rate and Rhythm: Normal rate and regular rhythm.     Pulses: Normal pulses.     Heart sounds: Normal heart sounds. No murmur. No gallop.   Pulmonary:     Effort: Pulmonary effort is normal. No respiratory distress.     Breath sounds: Normal breath sounds. No stridor. No wheezing, rhonchi or rales.  Chest:     Chest wall: No tenderness.  Abdominal:     General: Bowel sounds are normal. There is no distension.     Tenderness: There is no guarding or rebound.     Hernia: No hernia is present.  Musculoskeletal:     Right lower leg: No edema.     Left lower leg: No edema.  Skin:    Findings: Lesion present.  Neurological:     Mental Status: She is alert.           Assessment & Plan:  Controlled type 2 diabetes mellitus with complication, with long-term current use of insulin (HCC) - Plan: Hemoglobin A1c, CBC with Differential/Platelet, COMPLETE METABOLIC PANEL WITH GFR, Lipid panel, Microalbumin, urine, Ambulatory referral to Podiatry  Hyperlipidemia, unspecified hyperlipidemia type  Smoker  Benign essential HTN  Callus of foot  I recommended setting a basal dose of Lantus such as 70 units.  I then recommended adding Trulicity and uptitrating 1.5 mg weekly to help control some of the fluctuations in her sugars throughout the day and afford some weight loss.  Patient is interested in this.  However first we will start her on Chantix for smoking cessation and once the nausea from Chantix subsides then we will do the Trulicity.  She is not on any renal protection.  I will check a urine microalbumin and if elevated will start her on an angiotensin receptor blocker given her history of cough due to ACE inhibitor.  This would also help her blood pressure.  I will also check a fasting lipid panel.  Patient is already had her flu shot.  Refer her to a podiatrist for  custom molded inserts due to the pre-ulcerative callus forming on the medial surfaces of both great toes.

## 2018-01-18 ENCOUNTER — Other Ambulatory Visit: Payer: BLUE CROSS/BLUE SHIELD

## 2018-01-19 LAB — MICROALBUMIN, URINE: MICROALB UR: 2.6 mg/dL

## 2018-01-19 LAB — COMPLETE METABOLIC PANEL WITH GFR
AG Ratio: 2.3 (calc) (ref 1.0–2.5)
ALT: 26 U/L (ref 6–29)
AST: 19 U/L (ref 10–35)
Albumin: 4.3 g/dL (ref 3.6–5.1)
Alkaline phosphatase (APISO): 38 U/L (ref 33–115)
BUN: 12 mg/dL (ref 7–25)
CALCIUM: 9.2 mg/dL (ref 8.6–10.2)
CO2: 28 mmol/L (ref 20–32)
CREATININE: 0.76 mg/dL (ref 0.50–1.10)
Chloride: 104 mmol/L (ref 98–110)
GFR, EST AFRICAN AMERICAN: 107 mL/min/{1.73_m2} (ref >=60)
GFR, EST NON AFRICAN AMERICAN: 92 mL/min/{1.73_m2} (ref >=60)
GLOBULIN: 1.9 g/dL (ref 1.9–3.7)
Glucose, Bld: 189 mg/dL — ABNORMAL HIGH (ref 65–99)
POTASSIUM: 4.3 mmol/L (ref 3.5–5.3)
SODIUM: 140 mmol/L (ref 135–146)
TOTAL PROTEIN: 6.2 g/dL (ref 6.1–8.1)
Total Bilirubin: 0.3 mg/dL (ref 0.2–1.2)

## 2018-01-19 LAB — CBC WITH DIFFERENTIAL/PLATELET
ABSOLUTE MONOCYTES: 540 {cells}/uL (ref 200–950)
BASOS ABS: 52 {cells}/uL (ref 0–200)
Basophils Relative: 0.8 %
Eosinophils Absolute: 286 cells/uL (ref 15–500)
Eosinophils Relative: 4.4 %
HCT: 42.3 % (ref 35.0–45.0)
Hemoglobin: 14.9 g/dL (ref 11.7–15.5)
LYMPHS ABS: 1794 {cells}/uL (ref 850–3900)
MCH: 31.3 pg (ref 27.0–33.0)
MCHC: 35.2 g/dL (ref 32.0–36.0)
MCV: 88.9 fL (ref 80.0–100.0)
MPV: 10.3 fL (ref 7.5–12.5)
Monocytes Relative: 8.3 %
NEUTROS PCT: 58.9 %
Neutro Abs: 3829 cells/uL (ref 1500–7800)
Platelets: 224 10*3/uL (ref 140–400)
RBC: 4.76 10*6/uL (ref 3.80–5.10)
RDW: 12.9 % (ref 11.0–15.0)
Total Lymphocyte: 27.6 %
WBC: 6.5 10*3/uL (ref 3.8–10.8)

## 2018-01-19 LAB — LIPID PANEL
CHOL/HDL RATIO: 4.3 (calc) (ref <5.0)
Cholesterol: 132 mg/dL (ref <200)
HDL: 31 mg/dL — ABNORMAL LOW (ref >50)
LDL Cholesterol (Calc): 72 mg/dL (calc)
NON-HDL CHOLESTEROL (CALC): 101 mg/dL (ref <130)
TRIGLYCERIDES: 192 mg/dL — AB (ref <150)

## 2018-01-19 LAB — HEMOGLOBIN A1C
Hgb A1c MFr Bld: 7.5 % of total Hgb — ABNORMAL HIGH (ref <5.7)
Mean Plasma Glucose: 169 (calc)
eAG (mmol/L): 9.3 (calc)

## 2018-02-02 ENCOUNTER — Ambulatory Visit (INDEPENDENT_AMBULATORY_CARE_PROVIDER_SITE_OTHER): Payer: BLUE CROSS/BLUE SHIELD | Admitting: Podiatry

## 2018-02-02 ENCOUNTER — Encounter: Payer: Self-pay | Admitting: Podiatry

## 2018-02-02 DIAGNOSIS — M79675 Pain in left toe(s): Secondary | ICD-10-CM

## 2018-02-02 DIAGNOSIS — B351 Tinea unguium: Secondary | ICD-10-CM | POA: Diagnosis not present

## 2018-02-02 DIAGNOSIS — L84 Corns and callosities: Secondary | ICD-10-CM | POA: Diagnosis not present

## 2018-02-02 DIAGNOSIS — M79674 Pain in right toe(s): Secondary | ICD-10-CM

## 2018-02-02 DIAGNOSIS — E119 Type 2 diabetes mellitus without complications: Secondary | ICD-10-CM

## 2018-02-02 NOTE — Patient Instructions (Addendum)
Diabetes Mellitus and Foot Care Foot care is an important part of your health, especially when you have diabetes. Diabetes may cause you to have problems because of poor blood flow (circulation) to your feet and legs, which can cause your skin to:  Become thinner and drier.  Break more easily.  Heal more slowly.  Peel and crack. You may also have nerve damage (neuropathy) in your legs and feet, causing decreased feeling in them. This means that you may not notice minor injuries to your feet that could lead to more serious problems. Noticing and addressing any potential problems early is the best way to prevent future foot problems. How to care for your feet Foot hygiene  Wash your feet daily with warm water and mild soap. Do not use hot water. Then, pat your feet and the areas between your toes until they are completely dry. Do not soak your feet as this can dry your skin.  Trim your toenails straight across. Do not dig under them or around the cuticle. File the edges of your nails with an emery board or nail file.  Apply a moisturizing lotion or petroleum jelly to the skin on your feet and to dry, brittle toenails. Use lotion that does not contain alcohol and is unscented. Do not apply lotion between your toes. Shoes and socks  Wear clean socks or stockings every day. Make sure they are not too tight. Do not wear knee-high stockings since they may decrease blood flow to your legs.  Wear shoes that fit properly and have enough cushioning. Always look in your shoes before you put them on to be sure there are no objects inside.  To break in new shoes, wear them for just a few hours a day. This prevents injuries on your feet. Wounds, scrapes, corns, and calluses  Check your feet daily for blisters, cuts, bruises, sores, and redness. If you cannot see the bottom of your feet, use a mirror or ask someone for help.  Do not cut corns or calluses or try to remove them with medicine.  If you  find a minor scrape, cut, or break in the skin on your feet, keep it and the skin around it clean and dry. You may clean these areas with mild soap and water. Do not clean the area with peroxide, alcohol, or iodine.  If you have a wound, scrape, corn, or callus on your foot, look at it several times a day to make sure it is healing and not infected. Check for: ? Redness, swelling, or pain. ? Fluid or blood. ? Warmth. ? Pus or a bad smell. General instructions  Do not cross your legs. This may decrease blood flow to your feet.  Do not use heating pads or hot water bottles on your feet. They may burn your skin. If you have lost feeling in your feet or legs, you may not know this is happening until it is too late.  Protect your feet from hot and cold by wearing shoes, such as at the beach or on hot pavement.  Schedule a complete foot exam at least once a year (annually) or more often if you have foot problems. If you have foot problems, report any cuts, sores, or bruises to your health care provider immediately. Contact a health care provider if:  You have a medical condition that increases your risk of infection and you have any cuts, sores, or bruises on your feet.  You have an injury that is not   healing.  You have redness on your legs or feet.  You feel burning or tingling in your legs or feet.  You have pain or cramps in your legs and feet.  Your legs or feet are numb.  Your feet always feel cold.  You have pain around a toenail. Get help right away if:  You have a wound, scrape, corn, or callus on your foot and: ? You have pain, swelling, or redness that gets worse. ? You have fluid or blood coming from the wound, scrape, corn, or callus. ? Your wound, scrape, corn, or callus feels warm to the touch. ? You have pus or a bad smell coming from the wound, scrape, corn, or callus. ? You have a fever. ? You have a red line going up your leg. Summary  Check your feet every day  for cuts, sores, red spots, swelling, and blisters.  Moisturize feet and legs daily.  Wear shoes that fit properly and have enough cushioning.  If you have foot problems, report any cuts, sores, or bruises to your health care provider immediately.  Schedule a complete foot exam at least once a year (annually) or more often if you have foot problems. This information is not intended to replace advice given to you by your health care provider. Make sure you discuss any questions you have with your health care provider. Document Released: 01/15/2000 Document Revised: 03/01/2017 Document Reviewed: 02/19/2016 Elsevier Interactive Patient Education  2019 [employer]  Diabetes Mellitus and [place] care is an important part of your health, especially when you have diabetes. Diabetes may cause you to have problems because of poor blood flow (circulation) to your feet and legs, which can cause your skin to:  Become thinner and drier.  Break more easily.  Heal more slowly.  Peel and crack. You may also have nerve damage (neuropathy) in your legs and feet, causing decreased feeling in them. This means that you may not notice minor injuries to your feet that could lead to more serious problems. Noticing and addressing any potential problems early is the best way to prevent future foot problems. How to care for your feet Foot hygiene  Wash your feet daily with warm water and mild soap. Do not use hot water. Then, pat your feet and the areas between your toes until they are completely dry. Do not soak your feet as this can dry your skin.  Trim your toenails straight across. Do not dig under them or around the cuticle. File the edges of your nails with an emery board or nail file.  Apply a moisturizing lotion or petroleum jelly to the skin on your feet and to dry, brittle toenails. Use lotion that does not contain alcohol and is unscented. Do not apply lotion between your toes. Shoes and  socks  Wear clean socks or stockings every day. Make sure they are not too tight. Do not wear knee-high stockings since they may decrease blood flow to your legs.  Wear shoes that fit properly and have enough cushioning. Always look in your shoes before you put them on to be sure there are no objects inside.  To break in new shoes, wear them for just a few hours a day. This prevents injuries on your feet. Wounds, scrapes, corns, and calluses  Check your feet daily for blisters, cuts, bruises, sores, and redness. If you cannot see the bottom of your feet, use a mirror or ask someone for help.  Do not cut  corns or calluses or try to remove them with medicine.  If you find a minor scrape, cut, or break in the skin on your feet, keep it and the skin around it clean and dry. You may clean these areas with mild soap and water. Do not clean the area with peroxide, alcohol, or iodine.  If you have a wound, scrape, corn, or callus on your foot, look at it several times a day to make sure it is healing and not infected. Check for: ? Redness, swelling, or pain. ? Fluid or blood. ? Warmth. ? Pus or a bad smell. General instructions  Do not cross your legs. This may decrease blood flow to your feet.  Do not use heating pads or hot water bottles on your feet. They may burn your skin. If you have lost feeling in your feet or legs, you may not know this is happening until it is too late.  Protect your feet from hot and cold by wearing shoes, such as at the beach or on hot pavement.  Schedule a complete foot exam at least once a year (annually) or more often if you have foot problems. If you have foot problems, report any cuts, sores, or bruises to your health care provider immediately. Contact a health care provider if:  You have a medical condition that increases your risk of infection and you have any cuts, sores, or bruises on your feet.  You have an injury that is not healing.  You have  redness on your legs or feet.  You feel burning or tingling in your legs or feet.  You have pain or cramps in your legs and feet.  Your legs or feet are numb.  Your feet always feel cold.  You have pain around a toenail. Get help right away if:  You have a wound, scrape, corn, or callus on your foot and: ? You have pain, swelling, or redness that gets worse. ? You have fluid or blood coming from the wound, scrape, corn, or callus. ? Your wound, scrape, corn, or callus feels warm to the touch. ? You have pus or a bad smell coming from the wound, scrape, corn, or callus. ? You have a fever. ? You have a red line going up your leg. Summary  Check your feet every day for cuts, sores, red spots, swelling, and blisters.  Moisturize feet and legs daily.  Wear shoes that fit properly and have enough cushioning.  If you have foot problems, report any cuts, sores, or bruises to your health care provider immediately.  Schedule a complete foot exam at least once a year (annually) or more often if you have foot problems. This information is not intended to replace advice given to you by your health care provider. Make sure you discuss any questions you have with your health care provider. Document Released: 01/15/2000 Document Revised: 03/01/2017 Document Reviewed: 02/19/2016 Elsevier Interactive Patient Education  2019 [place] are small areas of thickened skin that occur on the top, sides, or tip of a toe. They contain a cone-shaped core with a point that can press on a nerve below. This causes pain.  Calluses are areas of thickened skin that can occur anywhere on the body, including the hands, fingers, palms, soles of the feet, and heels. Calluses are usually larger than corns. What are the causes? Corns and calluses are caused by rubbing (friction) or pressure, such as from shoes that are too tight  or do not fit properly. What increases the risk? Corns  are more likely to develop in people who have misshapen toes (toe deformities), such as hammer toes. Calluses can occur with friction to any area of the skin. They are more likely to develop in people who:  Work with their hands.  Wear shoes that fit poorly, are too tight, or are high-heeled.  Have toe deformities. What are the signs or symptoms? Symptoms of a corn or callus include:  A hard growth on the skin.  Pain or tenderness under the skin.  Redness and swelling.  Increased discomfort while wearing tight-fitting shoes, if your feet are affected. If a corn or callus becomes infected, symptoms may include:  Redness and swelling that gets worse.  Pain.  Fluid, blood, or pus draining from the corn or callus. How is this diagnosed? Corns and calluses may be diagnosed based on your symptoms, your medical history, and a physical exam. How is this treated? Treatment for corns and calluses may include:  Removing the cause of the friction or pressure. This may involve: ? Changing your shoes. ? Wearing shoe inserts (orthotics) or other protective layers in your shoes, such as a corn pad. ? Wearing gloves.  Applying medicine to the skin (topical medicine) to help soften skin in the hardened, thickened areas.  Removing layers of dead skin with a file to reduce the size of the corn or callus.  Removing the corn or callus with a scalpel or laser.  Taking antibiotic medicines, if your corn or callus is infected.  Having surgery, if a toe deformity is the cause. Follow these instructions at home:   Take over-the-counter and prescription medicines only as told by your health care provider.  If you were prescribed an antibiotic, take it as told by your health care provider. Do not stop taking it even if your condition starts to improve.  Wear shoes that fit well. Avoid wearing high-heeled shoes and shoes that are too tight or too loose.  Wear any padding, protective layers,  gloves, or orthotics as told by your health care provider.  Soak your hands or feet and then use a file or pumice stone to soften your corn or callus. Do this as told by your health care provider.  Check your corn or callus every day for symptoms of infection. Contact a health care provider if you:  Notice that your symptoms do not improve with treatment.  Have redness or swelling that gets worse.  Notice that your corn or callus becomes painful.  Have fluid, blood, or pus coming from your corn or callus.  Have new symptoms. Summary  Corns are small areas of thickened skin that occur on the top, sides, or tip of a toe.  Calluses are areas of thickened skin that can occur anywhere on the body, including the hands, fingers, palms, and soles of the feet. Calluses are usually larger than corns.  Corns and calluses are caused by rubbing (friction) or pressure, such as from shoes that are too tight or do not fit properly.  Treatment may include wearing any padding, protective layers, gloves, or orthotics as told by your health care provider. This information is not intended to replace advice given to you by your health care provider. Make sure you discuss any questions you have with your health care provider. Document Released: 10/24/2003 Document Revised: 11/30/2016 Document Reviewed: 11/30/2016 Elsevier Interactive Patient Education  2019 [employer].

## 2018-02-16 ENCOUNTER — Other Ambulatory Visit: Payer: Self-pay | Admitting: Family Medicine

## 2018-02-16 MED ORDER — VARENICLINE TARTRATE 1 MG PO TABS: 1.0000 mg | ORAL_TABLET | Freq: Two times a day (BID) | ORAL | 0 refills | Status: DC

## 2018-02-25 NOTE — Progress Notes (Signed)
Subjective: Mckenzie Tyler presents today referred by [name], MD for diabetic foot examination. She has dx of diabetes and cc of "hard place on the side of her great toes" she suspects is a callus.  She denies any attempt at treatment.  She has numbness, tingling and burning in her feet on occasion, not constantly and it doesn't keep her up at night.  She denies any h/o foot wounds.  Past Medical History:  Diagnosis Date  . Depression   . Diabetes mellitus without complication ([place])   . Hyperlipidemia 02/13/2013  . Smoker     Patient Active Problem List   Diagnosis Date Noted  . Lateral epicondylitis of both elbows 10/01/2014  . Carpal tunnel syndrome 10/01/2014  . Depression   . Hyperlipidemia 02/13/2013  . Diabetes mellitus without complication ([place])   . Smoker     History reviewed. No pertinent surgical history.   Current Outpatient Medications:  .  insulin glargine (LANTUS) 100 UNIT/ML injection, Inject 1 mL (100 Units total) into the skin at bedtime., Disp: 3 vial, Rfl: 11 .  Insulin Syringe-Needle U-100 25G X 1" 1 ML MISC, Use as directed, Disp: 100 each, Rfl: 3 .  metFORMIN (GLUCOPHAGE) 1000 MG tablet, Take 1 tablet (1,000 mg total) by mouth 2 (two) times daily with a meal., Disp: 180 tablet, Rfl: 1 .  Multiple Vitamin (MULTIVITAMIN) tablet, Take 1 tablet by mouth daily., Disp: , Rfl:  .  simvastatin (ZOCOR) 20 MG tablet, TAKE ONE TABLET BY MOUTH ONCE DAILY AT  6PM, Disp: 90 tablet, Rfl: 1 .  varenicline (CHANTIX STARTING MONTH PAK) 0.5 MG X 11 & 1 MG X 42 tablet, Take one 0.5 mg tablet by mouth once daily for 3 days, then increase to one 0.5 mg tablet twice daily for 4 days, then increase to one 1 mg tablet twice daily., Disp: 53 tablet, Rfl: 0 .  varenicline (CHANTIX CONTINUING MONTH PAK) 1 MG tablet, Take 1 tablet (1 mg total) by mouth 2 (two) times daily., Disp: 2 tablet, Rfl: 0  No Known Allergies  Social History   Occupational History  . Not on file   Tobacco Use  . Smoking status: Current Every Day Smoker    Packs/day: 0.25    Types: Cigarettes  . Smokeless tobacco: Never Used  . Tobacco comment: has been trying to quit  Substance and Sexual Activity  . Alcohol use: No    Alcohol/week: 0.0 standard drinks  . Drug use: No  . Sexual activity: Yes    Birth control/protection: None    Family History  Problem Relation Age of Onset  . Diabetes Father   . Cancer Neg Hx   . Heart disease Neg Hx     Immunization History  Administered Date(s) Administered  . Influenza,inj,Quad PF,6+ Mos 10/28/2014  . Influenza-Unspecified 12/20/2013, 10/06/2015, 12/06/2017  . Pneumococcal Polysaccharide-23 05/23/2013  . Tdap 03/27/2014   Review of systems: Positive Findings in bold print.  Constitutional:  chills, fatigue, fever, sweats, weight change Communication: Optometrist, sign Ecologist, hand writing, iPad/Android device Head: headaches, head injury Eyes: changes in vision, eye pain, glaucoma, cataracts, macular degeneration, diplopia, glare,  light sensitivity, eyeglasses or contacts, blindness Ears nose mouth throat: Hard of hearing, ringing in ears, deaf, sign language,  vertigo,   nosebleeds,  rhinitis,  cold sores, snoring, swollen glands Cardiovascular: HTN, edema, arrhythmia, pacemaker in place, defibrillator in place,  chest pain/tightness, chronic anticoagulation, blood clot, heart failure Peripheral Vascular: leg cramps, varicose veins, blood clots,  lymphedema Respiratory:  difficulty breathing, denies congestion, SOB, wheezing, cough, emphysema Gastrointestinal: change in appetite or weight, abdominal pain, constipation, diarrhea, nausea, vomiting, vomiting blood, change in bowel habits, abdominal pain, jaundice, rectal bleeding, hemorrhoids, Genitourinary:  nocturia,  pain on urination,  blood in urine, Foley catheter, urinary urgency Musculoskeletal: uses mobility aid,  cramping, stiff joints, painful joints, decreased  joint motion, fractures, OA, gout Skin: +changes in toenails, color change, dryness, itching, mole changes,  rash  Neurological: headaches, numbness in feet, paresthesias in feet, burning in feet, fainting,  seizures, change in speech. denies headaches, memory problems/poor historian, cerebral palsy, weakness, paralysis Endocrine: diabetes, hypothyroidism, hyperthyroidism,  goiter, dry mouth, flushing, heat intolerance,  cold intolerance,  excessive thirst, denies polyuria,  nocturia Hematological:  easy bleeding, excessive bleeding, easy bruising, enlarged lymph nodes, on long term blood thinner, history of past transusions Allergy/immunological:  hives, eczema, frequent infections, multiple drug allergies, seasonal allergies, transplant recipient Psychiatric:  anxiety, depression, mood disorder, suicidal ideations, hallucinations   Objective: Vascular Examination: Capillary refill time immediate x 10 digits Dorsalis pedis and posterior tibial pulses 2/4 b/l No digital hair x 10 digits Skin temperature gradient WNL b/l  Dermatological Examination: Skin with normal turgor, texture and tone b/l  Hyperkeratotic lesion medial hallux b/l. No erythema, no edema, no drainage.  Toenails 1-5 b/l discolored, thick, dystrophic with subungual debris and pain with palpation to nailbeds due to thickness of nails.  Musculoskeletal: Muscle strength 5/5 to all LE muscle groups  Neurological: Sensation intact with 10 gram monofilament Vibratory sensation intact.  Assessment: 1. Painful onychomycosis toenails 1-5 b/l  2. Callus b/l hallux 3. NIDDM  Plan: 1. Discussed diabetic foot care principles. Literature dispensed on today. 2. Toenails 1-5 b/l were debrided in length and girth without iatrogenic bleeding. 3. Pared callus b/l hallux without incident. 4. Patient to continue soft, supportive shoe gear 5. Patient to report any pedal injuries to medical professional immediately. 6. Follow up 3  months. Patient/POA to call should there be a concern in the interim.

## 2018-03-23 ENCOUNTER — Telehealth: Payer: Self-pay | Admitting: *Deleted

## 2018-03-23 NOTE — Telephone Encounter (Signed)
Received request from pharmacy for PA on Chantix.   PA submitted.   Dx: Smoker.

## 2018-03-23 NOTE — Telephone Encounter (Signed)
Your information has been submitted to [employer] [place]. [employer] [place] will review the request and fax you a determination directly, typically within 3 business days of your submission once all necessary information is received.  If [employer] [place] has not responded in 3 business days or if you have any questions about your submission, contact [employer] [place] at [phone]. 

## 2018-03-27 ENCOUNTER — Encounter: Payer: Self-pay | Admitting: Family Medicine

## 2018-03-28 MED ORDER — DULAGLUTIDE 0.75 MG/0.5ML ~~LOC~~ SOAJ: SUBCUTANEOUS | 0 refills | Status: DC

## 2018-03-29 NOTE — Telephone Encounter (Signed)
Per patient, she has picked up medication.

## 2018-04-18 ENCOUNTER — Encounter: Payer: Self-pay | Admitting: Family Medicine

## 2018-04-19 ENCOUNTER — Ambulatory Visit (INDEPENDENT_AMBULATORY_CARE_PROVIDER_SITE_OTHER): Payer: BLUE CROSS/BLUE SHIELD | Admitting: Family Medicine

## 2018-04-19 ENCOUNTER — Encounter: Payer: Self-pay | Admitting: Family Medicine

## 2018-04-19 ENCOUNTER — Other Ambulatory Visit: Payer: Self-pay

## 2018-04-19 VITALS — BP 118/70 | HR 88 | Temp 97.9°F | Resp 16 | Ht 66.0 in | Wt 220.0 lb

## 2018-04-19 DIAGNOSIS — I10 Essential (primary) hypertension: Secondary | ICD-10-CM

## 2018-04-19 DIAGNOSIS — E118 Type 2 diabetes mellitus with unspecified complications: Secondary | ICD-10-CM

## 2018-04-19 DIAGNOSIS — Z794 Long term (current) use of insulin: Secondary | ICD-10-CM

## 2018-04-19 DIAGNOSIS — F17200 Nicotine dependence, unspecified, uncomplicated: Secondary | ICD-10-CM | POA: Diagnosis not present

## 2018-04-19 DIAGNOSIS — E785 Hyperlipidemia, unspecified: Secondary | ICD-10-CM | POA: Diagnosis not present

## 2018-04-19 NOTE — Progress Notes (Signed)
Subjective:    Patient ID: Mckenzie Tyler, female    DOB: 1970/07/25, 48 y.o.   MRN: [id number]  HPI  12/20 Patient is a 48 year old Caucasian female here today for checkup.  She has type 2 diabetes mellitus but is insulin-dependent.  She is currently on Lantus.  She takes a varying dose every night based on her sugars.  She guesstimates that she averages 70 units at night.  Her last hemoglobin A1c was fairly well controlled at 7.1.  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  She also sees fluctuating blood sugars.  So while her average is well controlled, her blood pressures fluctuate quite a bit throughout the day.  We spent more than 20 minutes today discussing options including mealtime insulin with fast acting insulin as a correction rather than varying amounts of basal insulin which I think would work better or possibly adding a GLP-1 such as Victoza or Trulicity.  Patient is very interested in [place] with Trulicity.  She also is interested in smoking cessation.  She has had success in the past with Chantix and would like to try it again.  She denies any chest pain shortness of breath or dyspnea on exertion.  I performed her diabetic foot exam and while her pulses and her sensation are normal, the patient has pre-ulcerative calluses forming on the medial surface of her great toes bilaterally.  At that time, my plan was: I recommended setting a basal dose of Lantus such as 70 units.  I then recommended adding Trulicity and uptitrating 1.5 mg weekly to help control some of the fluctuations in her sugars throughout the day and afford some weight loss.  Patient is interested in this.  However first we will start her on Chantix for smoking cessation and once the nausea from Chantix subsides then we will do the Trulicity.  She is not on any renal protection.  I will check a urine microalbumin and if elevated will start her on an angiotensin  receptor blocker given her history of cough due to ACE inhibitor.  This would also help her blood pressure.  I will also check a fasting lipid panel.  Patient is already had her flu shot.  Refer her to a podiatrist for custom molded inserts due to the pre-ulcerative callus forming on the medial surfaces of both great toes.  04/19/18 Patient is a very pleasant 48 year old Caucasian female here today for follow-up.  Since our last visit, the patient quit smoking!.  I am very proud of her for doing this.  She was taking Chantix to help with her smoking cessation and therefore did not start Trulicity due to concern about nausea.  She has stopped the Chantix now and has been on Trulicity now for the last 2 to 3 weeks.  She is taking 0.75 mg weekly.  She also decreased her insulin dose as we discussed down to 70 units.  However her fasting blood sugars are running high between 160 and 200.  She has not experienced any hypoglycemia on the Trulicity.  She denies any polyuria polydipsia or blurry vision.  She denies any chest pain shortness of breath or dyspnea on exertion.  She does have a plantars wart on the plantar aspect of her right foot.  It is proximal to the third MTP joint.  It is a roughly 4 mm hyperkeratotic papule.  She has an appointment to see her podiatrist in April and I have recommended having  her podiatrist core the lesion out.  Otherwise she is doing well with no concerns Past Medical History:  Diagnosis Date  . Depression   . Diabetes mellitus without complication ([place])   . Hyperlipidemia 02/13/2013  . Smoker    No past surgical history on file. Current Outpatient Medications on File Prior to Visit  Medication Sig Dispense Refill  . Dulaglutide (TRULICITY) 0.01 VC/9.4WH SOPN Inject .75mg  4 pen 0  . insulin glargine (LANTUS) 100 UNIT/ML injection Inject 1 mL (100 Units total) into the skin at bedtime. 3 vial 11  . Insulin Syringe-Needle U-100 25G X 1" 1 ML MISC Use as directed 100 each 3  .  metFORMIN (GLUCOPHAGE) 1000 MG tablet Take 1 tablet (1,000 mg total) by mouth 2 (two) times daily with a meal. 180 tablet 1  . Multiple Vitamin (MULTIVITAMIN) tablet Take 1 tablet by mouth daily.    . simvastatin (ZOCOR) 20 MG tablet TAKE ONE TABLET BY MOUTH ONCE DAILY AT  6PM 90 tablet 1   No current facility-administered medications on file prior to visit.    No Known Allergies Social History   Socioeconomic History  . Marital status: Single    Spouse name: Not on file  . Number of children: Not on file  . Years of education: Not on file  . Highest education level: Not on file  Occupational History  . Not on file  Social Needs  . Financial resource strain: Not on file  . Food insecurity:    Worry: Not on file    Inability: Not on file  . Transportation needs:    Medical: Not on file    Non-medical: Not on file  Tobacco Use  . Smoking status: Former Smoker    Packs/day: 0.25    Types: Cigarettes    Last attempt to quit: 01/21/2018    Years since quitting: 0.2  . Smokeless tobacco: Never Used  . Tobacco comment: has been trying to quit  Substance and Sexual Activity  . Alcohol use: No    Alcohol/week: 0.0 standard drinks  . Drug use: No  . Sexual activity: Yes    Birth control/protection: None  Lifestyle  . Physical activity:    Days per week: Not on file    Minutes per session: Not on file  . Stress: Not on file  Relationships  . Social connections:    Talks on phone: Not on file    Gets together: Not on file    Attends religious service: Not on file    Active member of club or organization: Not on file    Attends meetings of clubs or organizations: Not on file    Relationship status: Not on file  . Intimate partner violence:    Fear of current or ex partner: Not on file    Emotionally abused: Not on file    Physically abused: Not on file    Forced sexual activity: Not on file  Other Topics Concern  . Not on file  Social History Narrative  . Not on file       Review of Systems  All other systems reviewed and are negative.      Objective:   Physical Exam Vitals signs reviewed.  Constitutional:      General: She is not in acute distress.    Appearance: She is not ill-appearing.  Cardiovascular:     Rate and Rhythm: Normal rate and regular rhythm.     Pulses: Normal pulses.  Heart sounds: Normal heart sounds. No murmur. No gallop.   Pulmonary:     Effort: Pulmonary effort is normal. No respiratory distress.     Breath sounds: Normal breath sounds. No stridor. No wheezing, rhonchi or rales.  Chest:     Chest wall: No tenderness.  Abdominal:     General: Bowel sounds are normal. There is no distension.     Tenderness: There is no guarding or rebound.     Hernia: No hernia is present.  Musculoskeletal:     Right lower leg: No edema.     Left lower leg: No edema.  Skin:    Findings: Lesion present.  Neurological:     Mental Status: She is alert.           Assessment & Plan:  Controlled type 2 diabetes mellitus with complication, with long-term current use of insulin (HCC) - Plan: Hemoglobin A1c, CBC with Differential/Platelet, COMPLETE METABOLIC PANEL WITH GFR, Lipid panel, Microalbumin, urine  Hyperlipidemia, unspecified hyperlipidemia type  Smoker  Benign essential HTN  I am extremely proud of the patient for smoking cessation.  I congratulated her.  I would increase her insulin gradually back to 100 units daily.  I recommended that she do this in 3 to 4-day intervals over the next 1 to 2 weeks unless she sees her fasting blood sugars fall below 80.  She will increase her Lantus back to 100 units.  She will also increase her Trulicity to 1.5 mg weekly.  Hopefully we will see her 2-hour postprandial sugars fall below 160 and her fasting blood sugars fall below 130 with these changes.  I will check a CBC, CMP, fasting lipid panel, and hemoglobin A1c.  I encouraged the patient to see her podiatrist to have the plantars  wart treated.

## 2018-04-20 ENCOUNTER — Encounter: Payer: Self-pay | Admitting: Family Medicine

## 2018-04-20 LAB — CBC WITH DIFFERENTIAL/PLATELET
Absolute Monocytes: 728 cells/uL (ref 200–950)
BASOS PCT: 0.9 %
Basophils Absolute: 61 cells/uL (ref 0–200)
Eosinophils Absolute: 428 cells/uL (ref 15–500)
Eosinophils Relative: 6.3 %
HCT: 42.3 % (ref 35.0–45.0)
Hemoglobin: 14.6 g/dL (ref 11.7–15.5)
Lymphs Abs: 1537 cells/uL (ref 850–3900)
MCH: 30.8 pg (ref 27.0–33.0)
MCHC: 34.5 g/dL (ref 32.0–36.0)
MCV: 89.2 fL (ref 80.0–100.0)
MPV: 10.6 fL (ref 7.5–12.5)
Monocytes Relative: 10.7 %
Neutro Abs: 4046 cells/uL (ref 1500–7800)
Neutrophils Relative %: 59.5 %
Platelets: 225 10*3/uL (ref 140–400)
RBC: 4.74 10*6/uL (ref 3.80–5.10)
RDW: 12.9 % (ref 11.0–15.0)
Total Lymphocyte: 22.6 %
WBC: 6.8 10*3/uL (ref 3.8–10.8)

## 2018-04-20 LAB — HEMOGLOBIN A1C
EAG (MMOL/L): 11.6 (calc)
Hgb A1c MFr Bld: 8.9 % of total Hgb — ABNORMAL HIGH (ref <5.7)
MEAN PLASMA GLUCOSE: 209 (calc)

## 2018-04-20 LAB — COMPLETE METABOLIC PANEL WITH GFR
AG RATIO: 1.9 (calc) (ref 1.0–2.5)
ALT: 46 U/L — AB (ref 6–29)
AST: 38 U/L — ABNORMAL HIGH (ref 10–35)
Albumin: 4.4 g/dL (ref 3.6–5.1)
Alkaline phosphatase (APISO): 39 U/L (ref 31–125)
BUN: 17 mg/dL (ref 7–25)
CALCIUM: 9.4 mg/dL (ref 8.6–10.2)
CO2: 27 mmol/L (ref 20–32)
Chloride: 103 mmol/L (ref 98–110)
Creat: 0.87 mg/dL (ref 0.50–1.10)
GFR, Est African American: 91 mL/min/{1.73_m2} (ref >=60)
GFR, Est Non African American: 78 mL/min/{1.73_m2} (ref >=60)
Globulin: 2.3 g/dL (calc) (ref 1.9–3.7)
Glucose, Bld: 151 mg/dL — ABNORMAL HIGH (ref 65–99)
POTASSIUM: 5 mmol/L (ref 3.5–5.3)
Sodium: 141 mmol/L (ref 135–146)
Total Bilirubin: 0.6 mg/dL (ref 0.2–1.2)
Total Protein: 6.7 g/dL (ref 6.1–8.1)

## 2018-04-20 LAB — LIPID PANEL
Cholesterol: 134 mg/dL (ref <200)
HDL: 35 mg/dL — ABNORMAL LOW (ref >=50)
LDL Cholesterol (Calc): 65 mg/dL (calc)
NON-HDL CHOLESTEROL (CALC): 99 mg/dL (ref <130)
Total CHOL/HDL Ratio: 3.8 (calc) (ref <5.0)
Triglycerides: 290 mg/dL — ABNORMAL HIGH (ref <150)

## 2018-04-20 LAB — MICROALBUMIN, URINE: Microalb, Ur: 1.3 mg/dL

## 2018-04-20 MED ORDER — DULAGLUTIDE 1.5 MG/0.5ML ~~LOC~~ SOAJ: SUBCUTANEOUS | 3 refills | Status: DC

## 2018-04-30 ENCOUNTER — Encounter: Payer: Self-pay | Admitting: Podiatry

## 2018-05-04 ENCOUNTER — Ambulatory Visit (INDEPENDENT_AMBULATORY_CARE_PROVIDER_SITE_OTHER): Payer: BLUE CROSS/BLUE SHIELD | Admitting: Podiatry

## 2018-05-04 ENCOUNTER — Encounter: Payer: Self-pay | Admitting: Podiatry

## 2018-05-04 ENCOUNTER — Other Ambulatory Visit: Payer: Self-pay

## 2018-05-04 DIAGNOSIS — M79674 Pain in right toe(s): Secondary | ICD-10-CM | POA: Diagnosis not present

## 2018-05-04 DIAGNOSIS — E119 Type 2 diabetes mellitus without complications: Secondary | ICD-10-CM | POA: Diagnosis not present

## 2018-05-04 DIAGNOSIS — M79675 Pain in left toe(s): Secondary | ICD-10-CM | POA: Diagnosis not present

## 2018-05-04 DIAGNOSIS — B351 Tinea unguium: Secondary | ICD-10-CM

## 2018-05-04 DIAGNOSIS — L84 Corns and callosities: Secondary | ICD-10-CM

## 2018-05-07 ENCOUNTER — Encounter: Payer: Self-pay | Admitting: Podiatry

## 2018-05-07 NOTE — Progress Notes (Signed)
Subjective: Analycia Tyler presents today with painful, thick toenails 1-5 b/l that she cannot cut and which interfere with daily activities.  Pain is aggravated when wearing enclosed shoe gear.  Patient states she had a hard area pop up on her right foot and she used Compound W and it has resolved.  She continues to work at [employer] during the [employer] pandemic.  [name], MD is her PCP. Last visit 04/19/2018.   Current Outpatient Medications:  .  Dulaglutide (TRULICITY) 1.5 W[place]/6.3ZC SOPN, Injection 1.5 mg SQ Weekly, Disp: 4 pen, Rfl: 3 .  insulin glargine (LANTUS) 100 UNIT/ML injection, Inject 1 mL (100 Units total) into the skin at bedtime., Disp: 3 vial, Rfl: 11 .  Insulin Syringe-Needle U-100 25G X 1" 1 ML MISC, Use as directed, Disp: 100 each, Rfl: 3 .  metFORMIN (GLUCOPHAGE) 1000 MG tablet, Take 1 tablet (1,000 mg total) by mouth 2 (two) times daily with a meal., Disp: 180 tablet, Rfl: 1 .  Multiple Vitamin (MULTIVITAMIN) tablet, Take 1 tablet by mouth daily., Disp: , Rfl:  .  simvastatin (ZOCOR) 20 MG tablet, TAKE ONE TABLET BY MOUTH ONCE DAILY AT  6PM, Disp: 90 tablet, Rfl: 1  No Known Allergies  Objective:  Vascular Examination: Capillary refill time immediate x 10 digits  Dorsalis pedis and Posterior tibial pulses palpable b/l  Digital hair absent x 10 digits  Skin temperature gradient WNL b/l  Dermatological Examination: Skin with normal turgor, texture and tone b/l  Toenails 1-5 b/l discolored, thick, dystrophic with subungual debris and pain with palpation to nailbeds due to thickness of nails.  Hyperkeratotic lesions b/l hallux. No erythema, no edema, no drainage, no flocculence noted.  Area plantar midfoot submet head 3 right foot annular, soft and supple. Consistent with patient's given history. No erythema, no drainage, no edema, no warmth nor flocculence. Area is soft and supple.  Musculoskeletal: Muscle strength 5/5 to all LE muscle  groups  No gross bony deformities b/l.  No pain, crepitus or joint limitation noted with ROM.   Neurological: Sensation intact with 10 gram monofilament.  Vibratory sensation intact.  Assessment: Painful onychomycosis toenails 1-5 b/l  Calluses b/l hallux NIDDM Possible verruca resolved with OTC medication  Plan: 1. Toenails 1-5 b/l were debrided in length and girth without iatrogenic bleeding. Calluses pared b/l hallux utilizing sterile scalpel blade without incident. 2. Patient to continue soft, supportive shoe gear daily. 3. Patient to report any pedal injuries to medical professional immediately. 4. Follow up 3 months.  5. Patient/POA to call should there be a concern in the interim.

## 2018-05-31 ENCOUNTER — Other Ambulatory Visit: Payer: Self-pay | Admitting: Family Medicine

## 2018-05-31 MED ORDER — INSULIN GLARGINE 100 UNIT/ML ~~LOC~~ SOLN: 100.0000 [IU] | Freq: Every day | SUBCUTANEOUS | 11 refills | Status: DC

## 2018-05-31 MED ORDER — SIMVASTATIN 20 MG PO TABS: ORAL_TABLET | ORAL | 1 refills | Status: DC

## 2018-06-07 ENCOUNTER — Other Ambulatory Visit: Payer: Self-pay | Admitting: *Deleted

## 2018-06-07 MED ORDER — METFORMIN HCL 1000 MG PO TABS: 1000.0000 mg | ORAL_TABLET | Freq: Two times a day (BID) | ORAL | 1 refills | Status: DC

## 2018-07-20 ENCOUNTER — Ambulatory Visit (INDEPENDENT_AMBULATORY_CARE_PROVIDER_SITE_OTHER): Payer: BC Managed Care – PPO | Admitting: Family Medicine

## 2018-07-20 ENCOUNTER — Encounter: Payer: Self-pay | Admitting: Family Medicine

## 2018-07-20 ENCOUNTER — Other Ambulatory Visit: Payer: Self-pay

## 2018-07-20 VITALS — BP 148/78 | HR 91 | Temp 97.9°F | Wt 217.8 lb

## 2018-07-20 DIAGNOSIS — E785 Hyperlipidemia, unspecified: Secondary | ICD-10-CM

## 2018-07-20 DIAGNOSIS — I10 Essential (primary) hypertension: Secondary | ICD-10-CM

## 2018-07-20 DIAGNOSIS — E119 Type 2 diabetes mellitus without complications: Secondary | ICD-10-CM | POA: Diagnosis not present

## 2018-07-20 MED ORDER — PIOGLITAZONE HCL 30 MG PO TABS: 30.0000 mg | ORAL_TABLET | Freq: Every day | ORAL | 3 refills | Status: DC

## 2018-07-20 MED ORDER — TERBINAFINE HCL 250 MG PO TABS: 250.0000 mg | ORAL_TABLET | Freq: Every day | ORAL | 2 refills | Status: DC

## 2018-07-20 MED ORDER — LOSARTAN POTASSIUM 25 MG PO TABS: 25.0000 mg | ORAL_TABLET | Freq: Every day | ORAL | 3 refills | Status: DC

## 2018-07-20 NOTE — Progress Notes (Signed)
Subjective:    Patient ID: Mckenzie Tyler, female    DOB: Aug 04, 1970, 48 y.o.   MRN: [id number]  Medication Refill    12/20 Patient is a 48 year old Caucasian female here today for checkup.  She has type 2 diabetes mellitus but is insulin-dependent.  She is currently on Lantus.  She takes a varying dose every night based on her sugars.  She guesstimates that she averages 70 units at night.  Her last hemoglobin A1c was fairly well controlled at 7.1.  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  She also sees fluctuating blood sugars.  So while her average is well controlled, her blood pressures fluctuate quite a bit throughout the day.  We spent more than 20 minutes today discussing options including mealtime insulin with fast acting insulin as a correction rather than varying amounts of basal insulin which I think would work better or possibly adding a GLP-1 such as Victoza or Trulicity.  Patient is very interested in [place] with Trulicity.  She also is interested in smoking cessation.  She has had success in the past with Chantix and would like to try it again.  She denies any chest pain shortness of breath or dyspnea on exertion.  I performed her diabetic foot exam and while her pulses and her sensation are normal, the patient has pre-ulcerative calluses forming on the medial surface of her great toes bilaterally.  At that time, my plan was: I recommended setting a basal dose of Lantus such as 70 units.  I then recommended adding Trulicity and uptitrating 1.5 mg weekly to help control some of the fluctuations in her sugars throughout the day and afford some weight loss.  Patient is interested in this.  However first we will start her on Chantix for smoking cessation and once the nausea from Chantix subsides then we will do the Trulicity.  She is not on any renal protection.  I will check a urine microalbumin and if elevated will start her  on an angiotensin receptor blocker given her history of cough due to ACE inhibitor.  This would also help her blood pressure.  I will also check a fasting lipid panel.  Patient is already had her flu shot.  Refer her to a podiatrist for custom molded inserts due to the pre-ulcerative callus forming on the medial surfaces of both great toes.  04/19/18 Patient is a very pleasant 48 year old Caucasian female here today for follow-up.  Since our last visit, the patient quit smoking!.  I am very proud of her for doing this.  She was taking Chantix to help with her smoking cessation and therefore did not start Trulicity due to concern about nausea.  She has stopped the Chantix now and has been on Trulicity now for the last 2 to 3 weeks.  She is taking 0.75 mg weekly.  She also decreased her insulin dose as we discussed down to 70 units.  However her fasting blood sugars are running high between 160 and 200.  She has not experienced any hypoglycemia on the Trulicity.  She denies any polyuria polydipsia or blurry vision.  She denies any chest pain shortness of breath or dyspnea on exertion.  She does have a plantars wart on the plantar aspect of her right foot.  It is proximal to the third MTP joint.  It is a roughly 4 mm hyperkeratotic papule.  She has an appointment to see her podiatrist in April and I  have recommended having her podiatrist core the lesion out.  Otherwise she is doing well with no concerns.  At that time, my plan was: I am extremely proud of the patient for smoking cessation.  I congratulated her.  I would increase her insulin gradually back to 100 units daily.  I recommended that she do this in 3 to 4-day intervals over the next 1 to 2 weeks unless she sees her fasting blood sugars fall below 80.  She will increase her Lantus back to 100 units.  She will also increase her Trulicity to 1.5 mg weekly.  Hopefully we will see her 2-hour postprandial sugars fall below 160 and her fasting blood sugars fall  below 130 with these changes.  I will check a CBC, CMP, fasting lipid panel, and hemoglobin A1c.  I encouraged the patient to see her podiatrist to have the plantars wart treated.  07/20/18 Patient continues to refrain from smoking.  I am extremely proud of her for this.  Unfortunately her blood pressure is elevated at 148/78.  She is also a diabetic and not on an angiotensin receptor blocker.  She is previously tried an ACE inhibitor in the past and had a cough associated with it.  She is never tried an ARB.  She continues to take insulin 80 units a day.  Her fasting blood sugars are 90-100.  However her 2-hour postprandial sugars are greater than 200.  Furthermore she hates Trulicity.  It causes her to feel extremely nauseated and experienced diarrhea.  She is unable to tolerate the medication.  He also reports mental fog all day.  She states that she wakes up relatively easily however it is almost like her brain does not wake up.  She has a hard time focusing.  She has poor memory.  She feels fatigued.  Her sister has sleep apnea.  Her father has sleep apnea.  She denies any depression.  She has no previous history of ADD.  Has no memory loss. Past Medical History:  Diagnosis Date   Depression    Diabetes mellitus without complication ([place])    Hyperlipidemia 02/13/2013   Smoker    No past surgical history on file. Current Outpatient Medications on File Prior to Visit  Medication Sig Dispense Refill   Dulaglutide (TRULICITY) 1.5 GG/2.6RS SOPN Injection 1.5 mg SQ Weekly 4 pen 3   insulin glargine (LANTUS) 100 UNIT/ML injection Inject 1 mL (100 Units total) into the skin at bedtime. 3 vial 11   Insulin Syringe-Needle U-100 25G X 1" 1 ML MISC Use as directed 100 each 3   metFORMIN (GLUCOPHAGE) 1000 MG tablet Take 1 tablet (1,000 mg total) by mouth 2 (two) times daily with a meal. 180 tablet 1   Multiple Vitamin (MULTIVITAMIN) tablet Take 1 tablet by mouth daily.     simvastatin (ZOCOR) 20  MG tablet TAKE ONE TABLET BY MOUTH ONCE DAILY AT  6PM 90 tablet 1   No current facility-administered medications on file prior to visit.    No Known Allergies Social History   Socioeconomic History   Marital status: Single    Spouse name: Not on file   Number of children: Not on file   Years of education: Not on file   Highest education level: Not on file  Occupational History   Not on file  Social Needs   Financial resource strain: Not on file   Food insecurity    Worry: Not on file    Inability: Not on file  Transportation needs    Medical: Not on file    Non-medical: Not on file  Tobacco Use   Smoking status: Former Smoker    Packs/day: 0.25    Types: Cigarettes    Quit date: 01/21/2018    Years since quitting: 0.4   Smokeless tobacco: Never Used   Tobacco comment: has been trying to quit  Substance and Sexual Activity   Alcohol use: No    Alcohol/week: 0.0 standard drinks   Drug use: No   Sexual activity: Yes    Birth control/protection: None  Lifestyle   Physical activity    Days per week: Not on file    Minutes per session: Not on file   Stress: Not on file  Relationships   Social connections    Talks on phone: Not on file    Gets together: Not on file    Attends religious service: Not on file    Active member of club or organization: Not on file    Attends meetings of clubs or organizations: Not on file    Relationship status: Not on file   Intimate partner violence    Fear of current or ex partner: Not on file    Emotionally abused: Not on file    Physically abused: Not on file    Forced sexual activity: Not on file  Other Topics Concern   Not on file  Social History Narrative   Not on file      Review of Systems  All other systems reviewed and are negative.      Objective:   Physical Exam Vitals signs reviewed.  Constitutional:      General: She is not in acute distress.    Appearance: She is not ill-appearing.    Cardiovascular:     Rate and Rhythm: Normal rate and regular rhythm.     Pulses: Normal pulses.     Heart sounds: Normal heart sounds. No murmur. No gallop.   Pulmonary:     Effort: Pulmonary effort is normal. No respiratory distress.     Breath sounds: Normal breath sounds. No stridor. No wheezing, rhonchi or rales.  Chest:     Chest wall: No tenderness.  Abdominal:     General: Bowel sounds are normal. There is no distension.     Tenderness: There is no guarding or rebound.     Hernia: No hernia is present.  Musculoskeletal:     Right lower leg: No edema.     Left lower leg: No edema.  Neurological:     Mental Status: She is alert.     Right thumbnail is thick yellow and dystrophic      Assessment & Plan:  The primary encounter diagnosis was Hyperlipidemia, unspecified hyperlipidemia type. Diagnoses of Benign essential HTN and Diabetes mellitus without complication ([place]) were also pertinent to this visit.  Regarding the patient's diabetes, we will continue her basal insulin at 80 units as her fasting sugars are well controlled.  We will discontinue Trulicity and replace with Actos 30 mg a day.  Recheck 2-hour postprandial sugars in 6 weeks and if still elevated consider adding Jardiance.  Because of her elevated blood pressure we will add losartan 25 mg a day and recheck blood pressure in 2 weeks.  Check fasting lipid panel.  Goal LDL cholesterol is less than 100.  Because of her mental fatigue and poor focus, I would recommend performing an overnight pulse oximetry to see if she has hypoxia and if it  present, check the patient for obstructive sleep apnea.  If not present consider ADD.  Treat onychomycosis with Lamisil 250 mg daily for up to 90 days.  Check liver test every 30 days to monitor for liver toxicity.  Explained this to the patient

## 2018-07-21 LAB — CBC WITH DIFFERENTIAL/PLATELET
Absolute Monocytes: 582 cells/uL (ref 200–950)
Basophils Absolute: 39 cells/uL (ref 0–200)
Basophils Relative: 0.7 %
Eosinophils Absolute: 330 cells/uL (ref 15–500)
Eosinophils Relative: 5.9 %
HCT: 42.3 % (ref 35.0–45.0)
Hemoglobin: 14.7 g/dL (ref 11.7–15.5)
Lymphs Abs: 1344 cells/uL (ref 850–3900)
MCH: 31 pg (ref 27.0–33.0)
MCHC: 34.8 g/dL (ref 32.0–36.0)
MCV: 89.2 fL (ref 80.0–100.0)
MPV: 10.9 fL (ref 7.5–12.5)
Monocytes Relative: 10.4 %
Neutro Abs: 3304 cells/uL (ref 1500–7800)
Neutrophils Relative %: 59 %
Platelets: 197 10*3/uL (ref 140–400)
RBC: 4.74 10*6/uL (ref 3.80–5.10)
RDW: 13 % (ref 11.0–15.0)
Total Lymphocyte: 24 %
WBC: 5.6 10*3/uL (ref 3.8–10.8)

## 2018-07-21 LAB — COMPLETE METABOLIC PANEL WITH GFR
AG Ratio: 1.9 (calc) (ref 1.0–2.5)
ALT: 43 U/L — ABNORMAL HIGH (ref 6–29)
AST: 45 U/L — ABNORMAL HIGH (ref 10–35)
Albumin: 4.3 g/dL (ref 3.6–5.1)
Alkaline phosphatase (APISO): 43 U/L (ref 31–125)
BUN: 16 mg/dL (ref 7–25)
CO2: 21 mmol/L (ref 20–32)
Calcium: 9.2 mg/dL (ref 8.6–10.2)
Chloride: 103 mmol/L (ref 98–110)
Creat: 0.8 mg/dL (ref 0.50–1.10)
GFR, Est African American: 100 mL/min/{1.73_m2} (ref >=60)
GFR, Est Non African American: 87 mL/min/{1.73_m2} (ref >=60)
Globulin: 2.3 g/dL (calc) (ref 1.9–3.7)
Glucose, Bld: 209 mg/dL — ABNORMAL HIGH (ref 65–99)
Potassium: 4.6 mmol/L (ref 3.5–5.3)
Sodium: 138 mmol/L (ref 135–146)
Total Bilirubin: 0.4 mg/dL (ref 0.2–1.2)
Total Protein: 6.6 g/dL (ref 6.1–8.1)

## 2018-07-21 LAB — LIPID PANEL
Cholesterol: 135 mg/dL (ref <200)
HDL: 28 mg/dL — ABNORMAL LOW (ref >=50)
LDL Cholesterol (Calc): 59 mg/dL (calc)
Non-HDL Cholesterol (Calc): 107 mg/dL (calc) (ref <130)
Total CHOL/HDL Ratio: 4.8 (calc) (ref <5.0)
Triglycerides: 397 mg/dL — ABNORMAL HIGH (ref <150)

## 2018-07-21 LAB — HEMOGLOBIN A1C
Hgb A1c MFr Bld: 8.6 % of total Hgb — ABNORMAL HIGH (ref <5.7)
Mean Plasma Glucose: 200 (calc)
eAG (mmol/L): 11.1 (calc)

## 2018-07-21 LAB — MICROALBUMIN, URINE: Microalb, Ur: 5.8 mg/dL

## 2018-07-23 ENCOUNTER — Other Ambulatory Visit: Payer: Self-pay | Admitting: Family Medicine

## 2018-07-23 DIAGNOSIS — E669 Obesity, unspecified: Secondary | ICD-10-CM

## 2018-07-23 DIAGNOSIS — R413 Other amnesia: Secondary | ICD-10-CM

## 2018-07-23 DIAGNOSIS — R5383 Other fatigue: Secondary | ICD-10-CM

## 2018-08-10 ENCOUNTER — Ambulatory Visit (INDEPENDENT_AMBULATORY_CARE_PROVIDER_SITE_OTHER): Payer: BC Managed Care – PPO | Admitting: Podiatry

## 2018-08-10 ENCOUNTER — Encounter: Payer: Self-pay | Admitting: Podiatry

## 2018-08-10 ENCOUNTER — Other Ambulatory Visit: Payer: Self-pay

## 2018-08-10 DIAGNOSIS — E119 Type 2 diabetes mellitus without complications: Secondary | ICD-10-CM | POA: Diagnosis not present

## 2018-08-10 DIAGNOSIS — L84 Corns and callosities: Secondary | ICD-10-CM

## 2018-08-10 NOTE — Patient Instructions (Signed)
Diabetes Mellitus and Foot Care Foot care is an important part of your health, especially when you have diabetes. Diabetes may cause you to have problems because of poor blood flow (circulation) to your feet and legs, which can cause your skin to:  Become thinner and drier.  Break more easily.  Heal more slowly.  Peel and crack. You may also have nerve damage (neuropathy) in your legs and feet, causing decreased feeling in them. This means that you may not notice minor injuries to your feet that could lead to more serious problems. Noticing and addressing any potential problems early is the best way to prevent future foot problems. How to care for your feet Foot hygiene  Wash your feet daily with warm water and mild soap. Do not use hot water. Then, pat your feet and the areas between your toes until they are completely dry. Do not soak your feet as this can dry your skin.  Trim your toenails straight across. Do not dig under them or around the cuticle. File the edges of your nails with an emery board or nail file.  Apply a moisturizing lotion or petroleum jelly to the skin on your feet and to dry, brittle toenails. Use lotion that does not contain alcohol and is unscented. Do not apply lotion between your toes. Shoes and socks  Wear clean socks or stockings every day. Make sure they are not too tight. Do not wear knee-high stockings since they may decrease blood flow to your legs.  Wear shoes that fit properly and have enough cushioning. Always look in your shoes before you put them on to be sure there are no objects inside.  To break in new shoes, wear them for just a few hours a day. This prevents injuries on your feet. Wounds, scrapes, corns, and calluses  Check your feet daily for blisters, cuts, bruises, sores, and redness. If you cannot see the bottom of your feet, use a mirror or ask someone for help.  Do not cut corns or calluses or try to remove them with medicine.  If you  find a minor scrape, cut, or break in the skin on your feet, keep it and the skin around it clean and dry. You may clean these areas with mild soap and water. Do not clean the area with peroxide, alcohol, or iodine.  If you have a wound, scrape, corn, or callus on your foot, look at it several times a day to make sure it is healing and not infected. Check for: ? Redness, swelling, or pain. ? Fluid or blood. ? Warmth. ? Pus or a bad smell. General instructions  Do not cross your legs. This may decrease blood flow to your feet.  Do not use heating pads or hot water bottles on your feet. They may burn your skin. If you have lost feeling in your feet or legs, you may not know this is happening until it is too late.  Protect your feet from hot and cold by wearing shoes, such as at the beach or on hot pavement.  Schedule a complete foot exam at least once a year (annually) or more often if you have foot problems. If you have foot problems, report any cuts, sores, or bruises to your health care provider immediately. Contact a health care provider if:  You have a medical condition that increases your risk of infection and you have any cuts, sores, or bruises on your feet.  You have an injury that is not   healing.  You have redness on your legs or feet.  You feel burning or tingling in your legs or feet.  You have pain or cramps in your legs and feet.  Your legs or feet are numb.  Your feet always feel cold.  You have pain around a toenail. Get help right away if:  You have a wound, scrape, corn, or callus on your foot and: ? You have pain, swelling, or redness that gets worse. ? You have fluid or blood coming from the wound, scrape, corn, or callus. ? Your wound, scrape, corn, or callus feels warm to the touch. ? You have pus or a bad smell coming from the wound, scrape, corn, or callus. ? You have a fever. ? You have a red line going up your leg. Summary  Check your feet every day  for cuts, sores, red spots, swelling, and blisters.  Moisturize feet and legs daily.  Wear shoes that fit properly and have enough cushioning.  If you have foot problems, report any cuts, sores, or bruises to your health care provider immediately.  Schedule a complete foot exam at least once a year (annually) or more often if you have foot problems. This information is not intended to replace advice given to you by your health care provider. Make sure you discuss any questions you have with your health care provider. Document Released: 01/15/2000 Document Revised: 03/01/2017 Document Reviewed: 02/19/2016 Elsevier Patient Education  2020 [employer]  Corns and Calluses Corns are small areas of thickened skin that occur on the top, sides, or tip of a toe. They contain a cone-shaped core with a point that can press on a nerve below. This causes pain.  Calluses are areas of thickened skin that can occur anywhere on the body, including the hands, fingers, palms, soles of the feet, and heels. Calluses are usually larger than corns. What are the causes? Corns and calluses are caused by rubbing (friction) or pressure, such as from shoes that are too tight or do not fit properly. What increases the risk? Corns are more likely to develop in people who have misshapen toes (toe deformities), such as hammer toes. Calluses can occur with friction to any area of the skin. They are more likely to develop in people who:  Work with their hands.  Wear shoes that fit poorly, are too tight, or are high-heeled.  Have toe deformities. What are the signs or symptoms? Symptoms of a corn or callus include:  A hard growth on the skin.  Pain or tenderness under the skin.  Redness and swelling.  Increased discomfort while wearing tight-fitting shoes, if your feet are affected. If a corn or callus becomes infected, symptoms may include:  Redness and swelling that gets worse.  Pain.  Fluid, blood, or  pus draining from the corn or callus. How is this diagnosed? Corns and calluses may be diagnosed based on your symptoms, your medical history, and a physical exam. How is this treated? Treatment for corns and calluses may include:  Removing the cause of the friction or pressure. This may involve: ? Changing your shoes. ? Wearing shoe inserts (orthotics) or other protective layers in your shoes, such as a corn pad. ? Wearing gloves.  Applying medicine to the skin (topical medicine) to help soften skin in the hardened, thickened areas.  Removing layers of dead skin with a file to reduce the size of the corn or callus.  Removing the corn or callus with a scalpel or   laser.  Taking antibiotic medicines, if your corn or callus is infected.  Having surgery, if a toe deformity is the cause. Follow these instructions at home:   Take over-the-counter and prescription medicines only as told by your health care provider.  If you were prescribed an antibiotic, take it as told by your health care provider. Do not stop taking it even if your condition starts to improve.  Wear shoes that fit well. Avoid wearing high-heeled shoes and shoes that are too tight or too loose.  Wear any padding, protective layers, gloves, or orthotics as told by your health care provider.  Soak your hands or feet and then use a file or pumice stone to soften your corn or callus. Do this as told by your health care provider.  Check your corn or callus every day for symptoms of infection. Contact a health care provider if you:  Notice that your symptoms do not improve with treatment.  Have redness or swelling that gets worse.  Notice that your corn or callus becomes painful.  Have fluid, blood, or pus coming from your corn or callus.  Have new symptoms. Summary  Corns are small areas of thickened skin that occur on the top, sides, or tip of a toe.  Calluses are areas of thickened skin that can occur anywhere  on the body, including the hands, fingers, palms, and soles of the feet. Calluses are usually larger than corns.  Corns and calluses are caused by rubbing (friction) or pressure, such as from shoes that are too tight or do not fit properly.  Treatment may include wearing any padding, protective layers, gloves, or orthotics as told by your health care provider. This information is not intended to replace advice given to you by your health care provider. Make sure you discuss any questions you have with your health care provider. Document Released: 10/24/2003 Document Revised: 05/09/2018 Document Reviewed: 11/30/2016 Elsevier Patient Education  2020 Else[employer] Inc.  

## 2018-08-12 NOTE — Progress Notes (Signed)
Subjective: Mckenzie Tyler is a 48 y.o. y.o. female who presents today for preventative diabetic foot care with plantar callus which interfere with daily activities. Pain is aggravated when wearing enclosed shoe gear and relieved with periodic professional debridement.  She declines toenail debridement today due to her toenail polish.  [name], MD is his PCP. Last visit was 07/20/2018.   Current Outpatient Medications:  .  Dulaglutide (TRULICITY) 1.5 HE/5.2DP SOPN, Injection 1.5 mg SQ Weekly, Disp: 4 pen, Rfl: 3 .  insulin glargine (LANTUS) 100 UNIT/ML injection, Inject 1 mL (100 Units total) into the skin at bedtime. (Patient taking differently: Inject 80 Units into the skin at bedtime. ), Disp: 3 vial, Rfl: 11 .  Insulin Syringe-Needle U-100 25G X 1" 1 ML MISC, Use as directed, Disp: 100 each, Rfl: 3 .  losartan (COZAAR) 25 MG tablet, Take 1 tablet (25 mg total) by mouth daily., Disp: 90 tablet, Rfl: 3 .  metFORMIN (GLUCOPHAGE) 1000 MG tablet, Take 1 tablet (1,000 mg total) by mouth 2 (two) times daily with a meal., Disp: 180 tablet, Rfl: 1 .  Multiple Vitamin (MULTIVITAMIN) tablet, Take 1 tablet by mouth daily., Disp: , Rfl:  .  pioglitazone (ACTOS) 30 MG tablet, Take 1 tablet (30 mg total) by mouth daily., Disp: 90 tablet, Rfl: 3 .  simvastatin (ZOCOR) 20 MG tablet, TAKE ONE TABLET BY MOUTH ONCE DAILY AT  6PM, Disp: 90 tablet, Rfl: 1 .  terbinafine (LAMISIL) 250 MG tablet, Take 1 tablet (250 mg total) by mouth daily., Disp: 30 tablet, Rfl: 2  No Known Allergies  Objective: Vascular Examination: Capillary refill time immediate x 10 digits.  Dorsalis pedis pulses palpable b/l.  Posterior tibial pulses palpable b/l.  Digital hair absent x 10 digits.  Skin temperature gradient WNL b/l.  Dermatological Examination: Skin with normal turgor, texture and tone b/l.  Toenails 1-5 b/l well manicured with nail polish.   Hyperkeratotic lesion submet head 3 right foot. No  erythema, no edema, no drainage, no flocculence noted.   Musculoskeletal: Muscle strength 5/5 to all LE muscle groups b/l.  Neurological: Sensation intact 5/5 b/l with 10 gram monofilament.  Vibratory sensation intact b/l.  Assessment: 1.  Callus submet head 3 right foot 2.  NIDDM  Plan: 1. Continue diabetic foot care principles. Literature dispensed on today. 2. Toenails 1-5 b/l were not debrided at request of patient on today. 3. Hyperkeratotic lesion submet head 3 right foot pared with sterile scalpel blade without incident. 4. Patient to continue soft, supportive shoe gear daily. 5. Patient to report any pedal injuries to medical professional immediately. 6. Follow up 3 months.  7. Patient/POA to call should there be a concern in the interim.

## 2018-08-13 ENCOUNTER — Encounter: Payer: Self-pay | Admitting: Family Medicine

## 2018-08-14 NOTE — Telephone Encounter (Signed)
Need to see the patient's rash

## 2018-08-24 ENCOUNTER — Other Ambulatory Visit: Payer: Self-pay | Admitting: *Deleted

## 2018-08-24 ENCOUNTER — Other Ambulatory Visit: Payer: Self-pay

## 2018-08-24 ENCOUNTER — Encounter: Payer: Self-pay | Admitting: Family Medicine

## 2018-08-24 ENCOUNTER — Other Ambulatory Visit: Payer: BC Managed Care – PPO

## 2018-08-24 DIAGNOSIS — Z79899 Other long term (current) drug therapy: Secondary | ICD-10-CM

## 2018-08-25 LAB — COMPLETE METABOLIC PANEL WITH GFR
AG Ratio: 2 (calc) (ref 1.0–2.5)
ALT: 32 U/L — ABNORMAL HIGH (ref 6–29)
AST: 29 U/L (ref 10–35)
Albumin: 4.3 g/dL (ref 3.6–5.1)
Alkaline phosphatase (APISO): 42 U/L (ref 37–153)
BUN: 20 mg/dL (ref 7–25)
CO2: 27 mmol/L (ref 20–32)
Calcium: 9.8 mg/dL (ref 8.6–10.4)
Chloride: 99 mmol/L (ref 98–110)
Creat: 0.95 mg/dL (ref 0.50–1.05)
GFR, Est African American: 81 mL/min/{1.73_m2} (ref >=60)
GFR, Est Non African American: 70 mL/min/{1.73_m2} (ref >=60)
Globulin: 2.1 g/dL (calc) (ref 1.9–3.7)
Glucose, Bld: 288 mg/dL — ABNORMAL HIGH (ref 65–99)
Potassium: 4.5 mmol/L (ref 3.5–5.3)
Sodium: 140 mmol/L (ref 135–146)
Total Bilirubin: 0.4 mg/dL (ref 0.2–1.2)
Total Protein: 6.4 g/dL (ref 6.1–8.1)

## 2018-08-30 MED ORDER — INSULIN LISPRO (1 UNIT DIAL) 100 UNIT/ML (KWIKPEN): 7.0000 [IU] | PEN_INJECTOR | Freq: Three times a day (TID) | SUBCUTANEOUS | 3 refills | Status: DC

## 2018-08-30 MED ORDER — PEN NEEDLES 32G X 5 MM MISC: 1.0000 [IU] | Freq: Three times a day (TID) | 2 refills | Status: DC

## 2018-08-31 ENCOUNTER — Other Ambulatory Visit: Payer: Self-pay | Admitting: Family Medicine

## 2018-08-31 MED ORDER — NOVOLOG FLEXPEN 100 UNIT/ML ~~LOC~~ SOPN: 7.0000 [IU] | PEN_INJECTOR | Freq: Three times a day (TID) | SUBCUTANEOUS | 1 refills | Status: DC

## 2018-09-07 ENCOUNTER — Encounter: Payer: Self-pay | Admitting: Family Medicine

## 2018-09-10 ENCOUNTER — Ambulatory Visit (INDEPENDENT_AMBULATORY_CARE_PROVIDER_SITE_OTHER): Payer: BC Managed Care – PPO | Admitting: Family Medicine

## 2018-09-10 ENCOUNTER — Other Ambulatory Visit: Payer: Self-pay

## 2018-09-10 ENCOUNTER — Ambulatory Visit
Admission: RE | Admit: 2018-09-10 | Discharge: 2018-09-10 | Disposition: A | Discharge status: Home / Self Care | Payer: BLUE CROSS/BLUE SHIELD | Source: Ambulatory Visit | Attending: Family Medicine | Admitting: Family Medicine

## 2018-09-10 VITALS — BP 132/70 | HR 88 | Temp 98.3°F | Resp 16 | Ht 67.0 in | Wt 221.0 lb

## 2018-09-10 DIAGNOSIS — M25562 Pain in left knee: Secondary | ICD-10-CM

## 2018-09-10 NOTE — Progress Notes (Signed)
Subjective:    Patient ID: Mckenzie Tyler, female    DOB: [birth year], 48 y.o.   MRN: [id number]  HPI  Patient reports a gradual onset of pain in her left knee.  Pain is located primarily over the medial compartment.  She states that as long she standing still the pain is not severe however walking or going up and down steps causes severe pain in the medial compartment.  She denies any laxity in the knee joint.  She denies any locking in the knee joint.  She has a negative anterior and posterior drawer sign.  She has no evidence of varus or valgus instability.  She has a negative Apley grind.  However she is tender to palpation over the medial joint line.  She denies any specific injury however she is now having increasing pain bearing weight.  She is tried ibuprofen without any relief Past Medical History:  Diagnosis Date  . Depression   . Diabetes mellitus without complication ([place])   . Hyperlipidemia 02/13/2013  . Smoker    No past surgical history on file. Current Outpatient Medications on File Prior to Visit  Medication Sig Dispense Refill  . Dulaglutide (TRULICITY) 1.5 EX/5.1ZG SOPN Injection 1.5 mg SQ Weekly 4 pen 3  . insulin aspart (NOVOLOG FLEXPEN) 100 UNIT/ML FlexPen Inject 7 Units into the skin 3 (three) times daily with meals. 15 mL 1  . insulin glargine (LANTUS) 100 UNIT/ML injection Inject 1 mL (100 Units total) into the skin at bedtime. (Patient taking differently: Inject 80 Units into the skin at bedtime. ) 3 vial 11  . insulin lispro (HUMALOG KWIKPEN) 100 UNIT/ML KwikPen Inject 0.07 mLs (7 Units total) into the skin 3 (three) times daily. 15 mL 3  . Insulin Pen Needle (PEN NEEDLES) 32G X 5 MM MISC 1 Units by Does not apply route 3 (three) times daily. 100 each 2  . Insulin Syringe-Needle U-100 25G X 1" 1 ML MISC Use as directed 100 each 3  . losartan (COZAAR) 25 MG tablet Take 1 tablet (25 mg total) by mouth daily. 90 tablet 3  . Magnesium 400 MG TABS Take by mouth.    .  metFORMIN (GLUCOPHAGE) 1000 MG tablet Take 1 tablet (1,000 mg total) by mouth 2 (two) times daily with a meal. 180 tablet 1  . Multiple Vitamin (MULTIVITAMIN) tablet Take 1 tablet by mouth daily.    . pioglitazone (ACTOS) 30 MG tablet Take 1 tablet (30 mg total) by mouth daily. 90 tablet 3  . POTASSIUM PO Take by mouth.    . simvastatin (ZOCOR) 20 MG tablet TAKE ONE TABLET BY MOUTH ONCE DAILY AT  6PM 90 tablet 1  . terbinafine (LAMISIL) 250 MG tablet Take 1 tablet (250 mg total) by mouth daily. 30 tablet 2   No current facility-administered medications on file prior to visit.    No Known Allergies Social History   Socioeconomic History  . Marital status: Single    Spouse name: Not on file  . Number of children: Not on file  . Years of education: Not on file  . Highest education level: Not on file  Occupational History  . Not on file  Social Needs  . Financial resource strain: Not on file  . Food insecurity    Worry: Not on file    Inability: Not on file  . Transportation needs    Medical: Not on file    Non-medical: Not on file  Tobacco Use  . Smoking status:  Former Smoker    Packs/day: 0.25    Types: Cigarettes    Quit date: 01/21/2018    Years since quitting: 0.6  . Smokeless tobacco: Never Used  . Tobacco comment: has been trying to quit  Substance and Sexual Activity  . Alcohol use: No    Alcohol/week: 0.0 standard drinks  . Drug use: No  . Sexual activity: Yes    Birth control/protection: None  Lifestyle  . Physical activity    Days per week: Not on file    Minutes per session: Not on file  . Stress: Not on file  Relationships  . Social Herbalist on phone: Not on file    Gets together: Not on file    Attends religious service: Not on file    Active member of club or organization: Not on file    Attends meetings of clubs or organizations: Not on file    Relationship status: Not on file  . Intimate partner violence    Fear of current or ex  partner: Not on file    Emotionally abused: Not on file    Physically abused: Not on file    Forced sexual activity: Not on file  Other Topics Concern  . Not on file  Social History Narrative  . Not on file     Review of Systems  All other systems reviewed and are negative.      Objective:   Physical Exam Vitals signs reviewed.  Cardiovascular:     Rate and Rhythm: Normal rate and regular rhythm.     Heart sounds: Normal heart sounds.  Pulmonary:     Effort: Pulmonary effort is normal.     Breath sounds: Normal breath sounds.  Musculoskeletal:     Left knee: She exhibits normal range of motion, no swelling, no effusion, no ecchymosis, no deformity, no erythema, no LCL laxity, normal meniscus and no MCL laxity. Tenderness found. Medial joint line tenderness noted. No lateral joint line, no MCL and no LCL tenderness noted.           Assessment & Plan:  The encounter diagnosis was Acute pain of left knee. I suspect the patient has osteoarthritis of the medial compartment.  I will send the patient today for an x-ray of the left knee to confirm.  We also discussed therapeutic options and she would like to proceed with a cortisone injection in her left knee.  Using sterile technique, I injected the left knee with 2 cc of 0.1% lidocaine, 2 cc of Marcaine.  She tolerated procedure well without complications.  Await xrayresults.

## 2018-10-09 ENCOUNTER — Encounter: Payer: Self-pay | Admitting: Family Medicine

## 2018-10-09 ENCOUNTER — Other Ambulatory Visit: Payer: Self-pay | Admitting: *Deleted

## 2018-10-09 DIAGNOSIS — G4734 Idiopathic sleep related nonobstructive alveolar hypoventilation: Secondary | ICD-10-CM

## 2018-10-12 ENCOUNTER — Other Ambulatory Visit: Payer: Self-pay

## 2018-10-15 ENCOUNTER — Encounter: Payer: Self-pay | Admitting: Family Medicine

## 2018-10-15 ENCOUNTER — Other Ambulatory Visit: Payer: Self-pay

## 2018-10-15 ENCOUNTER — Ambulatory Visit (INDEPENDENT_AMBULATORY_CARE_PROVIDER_SITE_OTHER): Payer: BC Managed Care – PPO | Admitting: Family Medicine

## 2018-10-15 VITALS — BP 112/80 | HR 70 | Temp 98.2°F | Resp 16 | Ht 67.0 in | Wt 222.0 lb

## 2018-10-15 DIAGNOSIS — G4734 Idiopathic sleep related nonobstructive alveolar hypoventilation: Secondary | ICD-10-CM

## 2018-10-15 DIAGNOSIS — Z1211 Encounter for screening for malignant neoplasm of colon: Secondary | ICD-10-CM

## 2018-10-15 DIAGNOSIS — E785 Hyperlipidemia, unspecified: Secondary | ICD-10-CM | POA: Diagnosis not present

## 2018-10-15 DIAGNOSIS — E119 Type 2 diabetes mellitus without complications: Secondary | ICD-10-CM | POA: Diagnosis not present

## 2018-10-15 DIAGNOSIS — I10 Essential (primary) hypertension: Secondary | ICD-10-CM | POA: Diagnosis not present

## 2018-10-15 DIAGNOSIS — Z1239 Encounter for other screening for malignant neoplasm of breast: Secondary | ICD-10-CM

## 2018-10-15 DIAGNOSIS — F17200 Nicotine dependence, unspecified, uncomplicated: Secondary | ICD-10-CM | POA: Diagnosis not present

## 2018-10-15 NOTE — Progress Notes (Signed)
Subjective:    Patient ID: Mckenzie Tyler, female    DOB: [birth year], 48 y.o.   MRN: [id number]  Medication Refill    12/20 Patient is a 48 year old Caucasian female here today for checkup.  She has type 2 diabetes mellitus but is insulin-dependent.  She is currently on Lantus.  She takes a varying dose every night based on her sugars.  She guesstimates that she averages 70 units at night.  Her last hemoglobin A1c was fairly well controlled at 7.1.  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  She also sees fluctuating blood sugars.  So while her average is well controlled, her blood pressures fluctuate quite a bit throughout the day.  We spent more than 20 minutes today discussing options including mealtime insulin with fast acting insulin as a correction rather than varying amounts of basal insulin which I think would work better or possibly adding a GLP-1 such as Victoza or Trulicity.  Patient is very interested in [name] with Trulicity.  She also is interested in smoking cessation.  She has had success in the past with Chantix and would like to try it again.  She denies any chest pain shortness of breath or dyspnea on exertion.  I performed her diabetic foot exam and while her pulses and her sensation are normal, the patient has pre-ulcerative calluses forming on the medial surface of her great toes bilaterally.  At that time, my plan was: I recommended setting a basal dose of Lantus such as 70 units.  I then recommended adding Trulicity and uptitrating 1.5 mg weekly to help control some of the fluctuations in her sugars throughout the day and afford some weight loss.  Patient is interested in this.  However first we will start her on Chantix for smoking cessation and once the nausea from Chantix subsides then we will do the Trulicity.  She is not on any renal protection.  I will check a urine microalbumin and if elevated will start her  on an angiotensin receptor blocker given her history of cough due to ACE inhibitor.  This would also help her blood pressure.  I will also check a fasting lipid panel.  Patient is already had her flu shot.  Refer her to a podiatrist for custom molded inserts due to the pre-ulcerative callus forming on the medial surfaces of both great toes.  04/19/18 Patient is a very pleasant 48 year old Caucasian female here today for follow-up.  Since our last visit, the patient quit smoking!.  I am very proud of her for doing this.  She was taking Chantix to help with her smoking cessation and therefore did not start Trulicity due to concern about nausea.  She has stopped the Chantix now and has been on Trulicity now for the last 2 to 3 weeks.  She is taking 0.75 mg weekly.  She also decreased her insulin dose as we discussed down to 70 units.  However her fasting blood sugars are running high between 160 and 200.  She has not experienced any hypoglycemia on the Trulicity.  She denies any polyuria polydipsia or blurry vision.  She denies any chest pain shortness of breath or dyspnea on exertion.  She does have a plantars wart on the plantar aspect of her right foot.  It is proximal to the third MTP joint.  It is a roughly 4 mm hyperkeratotic papule.  She has an appointment to see her podiatrist in April and I  have recommended having her podiatrist core the lesion out.  Otherwise she is doing well with no concerns.  At that time, my plan was: I am extremely proud of the patient for smoking cessation.  I congratulated her.  I would increase her insulin gradually back to 100 units daily.  I recommended that she do this in 3 to 4-day intervals over the next 1 to 2 weeks unless she sees her fasting blood sugars fall below 80.  She will increase her Lantus back to 100 units.  She will also increase her Trulicity to 1.5 mg weekly.  Hopefully we will see her 2-hour postprandial sugars fall below 160 and her fasting blood sugars fall  below 130 with these changes.  I will check a CBC, CMP, fasting lipid panel, and hemoglobin A1c.  I encouraged the patient to see her podiatrist to have the plantars wart treated.  07/20/18 Patient continues to refrain from smoking.  I am extremely proud of her for this.  Unfortunately her blood pressure is elevated at 148/78.  She is also a diabetic and not on an angiotensin receptor blocker.  She is previously tried an ACE inhibitor in the past and had a cough associated with it.  She is never tried an ARB.  She continues to take insulin 80 units a day.  Her fasting blood sugars are 90-100.  However her 2-hour postprandial sugars are greater than 200.  Furthermore she hates Trulicity.  It causes her to feel extremely nauseated and experienced diarrhea.  She is unable to tolerate the medication.  He also reports mental fog all day.  She states that she wakes up relatively easily however it is almost like her brain does not wake up.  She has a hard time focusing.  She has poor memory.  She feels fatigued.  Her sister has sleep apnea.  Her father has sleep apnea.  She denies any depression.  She has no previous history of ADD.  Has no memory loss.  At that time, my plan was:  Regarding the patient's diabetes, we will continue her basal insulin at 80 units as her fasting sugars are well controlled.  We will discontinue Trulicity and replace with Actos 30 mg a day.  Recheck 2-hour postprandial sugars in 6 weeks and if still elevated consider adding Jardiance.  Because of her elevated blood pressure we will add losartan 25 mg a day and recheck blood pressure in 2 weeks.  Check fasting lipid panel.  Goal LDL cholesterol is less than 100.  Because of her mental fatigue and poor focus, I would recommend performing an overnight pulse oximetry to see if she has hypoxia and if it present, check the patient for obstructive sleep apnea.  If not present consider ADD.  Treat onychomycosis with Lamisil 250 mg daily for up to  90 days.  Check liver test every 30 days to monitor for liver toxicity.  Explained this to the patient  10/15/18 HgA1c was 8.6 in June.  However patient states that her blood sugar is doing much better.  She states that her average blood sugar in the morning is between 110 and 120.  She only occasionally has a low blood sugar which is in the 70s.  She states that her 2-hour postprandial sugars are generally less than 160.  She is only twice seeing her blood sugar over 200 in the evening.  Overall she is doing well.  She has an appointment to see a sleep specialist to evaluate for  obstructive sleep apnea.  Recently her overnight pulse oximetry did reveal nocturnal hypoxia.  That coupled with her poor focus and mental fatigue has me concerned that she may have obstructive sleep apnea.  That work-up is pending.  Her blood pressure today is excellent at 112/80.  She denies any chest pain shortness of breath or dyspnea on exertion.  Patient states that she has not had a mammogram in more than 3 years.  She has not had a colonoscopy ever.  She is overdue for a Pap smear as well.  We discussed all of these today.  She would like me to schedule her for mammogram and a colonoscopy.  She defers the Pap smear for now Past Medical History:  Diagnosis Date   Depression    Diabetes mellitus without complication ([place])    Hyperlipidemia 02/13/2013   Smoker    No past surgical history on file. Current Outpatient Medications on File Prior to Visit  Medication Sig Dispense Refill   Dulaglutide (TRULICITY) 1.5 0000000 SOPN Injection 1.5 mg SQ Weekly 4 pen 3   insulin aspart (NOVOLOG FLEXPEN) 100 UNIT/ML FlexPen Inject 7 Units into the skin 3 (three) times daily with meals. 15 mL 1   insulin glargine (LANTUS) 100 UNIT/ML injection Inject 1 mL (100 Units total) into the skin at bedtime. (Patient taking differently: Inject 80 Units into the skin at bedtime. ) 3 vial 11   insulin lispro (HUMALOG KWIKPEN) 100 UNIT/ML  KwikPen Inject 0.07 mLs (7 Units total) into the skin 3 (three) times daily. 15 mL 3   Insulin Pen Needle (PEN NEEDLES) 32G X 5 MM MISC 1 Units by Does not apply route 3 (three) times daily. 100 each 2   Insulin Syringe-Needle U-100 25G X 1" 1 ML MISC Use as directed 100 each 3   losartan (COZAAR) 25 MG tablet Take 1 tablet (25 mg total) by mouth daily. 90 tablet 3   Magnesium 400 MG TABS Take by mouth.     metFORMIN (GLUCOPHAGE) 1000 MG tablet Take 1 tablet (1,000 mg total) by mouth 2 (two) times daily with a meal. 180 tablet 1   Multiple Vitamin (MULTIVITAMIN) tablet Take 1 tablet by mouth daily.     pioglitazone (ACTOS) 30 MG tablet Take 1 tablet (30 mg total) by mouth daily. 90 tablet 3   POTASSIUM PO Take by mouth.     simvastatin (ZOCOR) 20 MG tablet TAKE ONE TABLET BY MOUTH ONCE DAILY AT  6PM 90 tablet 1   terbinafine (LAMISIL) 250 MG tablet Take 1 tablet (250 mg total) by mouth daily. 30 tablet 2   No current facility-administered medications on file prior to visit.    No Known Allergies Social History   Socioeconomic History   Marital status: Single    Spouse name: Not on file   Number of children: Not on file   Years of education: Not on file   Highest education level: Not on file  Occupational History   Not on file  Social Needs   Financial resource strain: Not on file   Food insecurity    Worry: Not on file    Inability: Not on file   Transportation needs    Medical: Not on file    Non-medical: Not on file  Tobacco Use   Smoking status: Former Smoker    Packs/day: 0.25    Types: Cigarettes    Quit date: 01/21/2018    Years since quitting: 0.7   Smokeless tobacco: Never Used  Tobacco comment: has been trying to quit  Substance and Sexual Activity   Alcohol use: No    Alcohol/week: 0.0 standard drinks   Drug use: No   Sexual activity: Yes    Birth control/protection: None  Lifestyle   Physical activity    Days per week: Not on file      Minutes per session: Not on file   Stress: Not on file  Relationships   Social connections    Talks on phone: Not on file    Gets together: Not on file    Attends religious service: Not on file    Active member of club or organization: Not on file    Attends meetings of clubs or organizations: Not on file    Relationship status: Not on file   Intimate partner violence    Fear of current or ex partner: Not on file    Emotionally abused: Not on file    Physically abused: Not on file    Forced sexual activity: Not on file  Other Topics Concern   Not on file  Social History Narrative   Not on file      Review of Systems  All other systems reviewed and are negative.      Objective:   Physical Exam Vitals signs reviewed.  Constitutional:      General: She is not in acute distress.    Appearance: She is not ill-appearing.  Cardiovascular:     Rate and Rhythm: Normal rate and regular rhythm.     Pulses: Normal pulses.     Heart sounds: Normal heart sounds. No murmur. No gallop.   Pulmonary:     Effort: Pulmonary effort is normal. No respiratory distress.     Breath sounds: Normal breath sounds. No stridor. No wheezing, rhonchi or rales.  Chest:     Chest wall: No tenderness.  Abdominal:     General: Bowel sounds are normal. There is no distension.     Tenderness: There is no guarding or rebound.     Hernia: No hernia is present.  Musculoskeletal:     Right lower leg: No edema.     Left lower leg: No edema.  Neurological:     Mental Status: She is alert.        Assessment & Plan:  Diabetes mellitus without complication ([place]) - Plan: Hemoglobin A1c, CBC with Differential/Platelet, COMPLETE METABOLIC PANEL WITH GFR, Lipid panel, Microalbumin, urine  Benign essential HTN  Hyperlipidemia, unspecified hyperlipidemia type  Smoker  Nocturnal hypoxia  Breast cancer screening - Plan: MM Digital Screening  Colon cancer screening - Plan: Ambulatory referral  to Gastroenterology  I am very happy with her reported blood sugars.  I anticipate that her hemoglobin A1c will be less than 7.  Patient also states that she has quit smoking and that has been smoke-free now for almost 8 months which I congratulated the patient for.  She has had her flu shot.  Her blood pressure today is well controlled at 112/80.  I will check a fasting lipid panel.  Given her diabetes, I would like her LDL cholesterol to be less than 100.  I will also schedule the patient for a mammogram.  I will schedule the patient for a colonoscopy.  Diabetic foot exam was performed today and is normal.

## 2018-10-16 ENCOUNTER — Encounter: Payer: Self-pay | Admitting: Gastroenterology

## 2018-10-16 LAB — COMPLETE METABOLIC PANEL WITH GFR
AG Ratio: 1.9 (calc) (ref 1.0–2.5)
ALT: 27 U/L (ref 6–29)
AST: 21 U/L (ref 10–35)
Albumin: 4.1 g/dL (ref 3.6–5.1)
Alkaline phosphatase (APISO): 33 U/L — ABNORMAL LOW (ref 37–153)
BUN: 18 mg/dL (ref 7–25)
CO2: 25 mmol/L (ref 20–32)
Calcium: 9.1 mg/dL (ref 8.6–10.4)
Chloride: 106 mmol/L (ref 98–110)
Creat: 0.78 mg/dL (ref 0.50–1.05)
GFR, Est African American: 103 mL/min/{1.73_m2} (ref >=60)
GFR, Est Non African American: 89 mL/min/{1.73_m2} (ref >=60)
Globulin: 2.2 g/dL (calc) (ref 1.9–3.7)
Glucose, Bld: 158 mg/dL — ABNORMAL HIGH (ref 65–99)
Potassium: 4.2 mmol/L (ref 3.5–5.3)
Sodium: 141 mmol/L (ref 135–146)
Total Bilirubin: 0.4 mg/dL (ref 0.2–1.2)
Total Protein: 6.3 g/dL (ref 6.1–8.1)

## 2018-10-16 LAB — CBC WITH DIFFERENTIAL/PLATELET
Absolute Monocytes: 512 cells/uL (ref 200–950)
Basophils Absolute: 42 cells/uL (ref 0–200)
Basophils Relative: 0.9 %
Eosinophils Absolute: 150 cells/uL (ref 15–500)
Eosinophils Relative: 3.2 %
HCT: 39 % (ref 35.0–45.0)
Hemoglobin: 13.2 g/dL (ref 11.7–15.5)
Lymphs Abs: 1274 cells/uL (ref 850–3900)
MCH: 30.3 pg (ref 27.0–33.0)
MCHC: 33.8 g/dL (ref 32.0–36.0)
MCV: 89.7 fL (ref 80.0–100.0)
MPV: 10.1 fL (ref 7.5–12.5)
Monocytes Relative: 10.9 %
Neutro Abs: 2721 cells/uL (ref 1500–7800)
Neutrophils Relative %: 57.9 %
Platelets: 189 10*3/uL (ref 140–400)
RBC: 4.35 10*6/uL (ref 3.80–5.10)
RDW: 13.3 % (ref 11.0–15.0)
Total Lymphocyte: 27.1 %
WBC: 4.7 10*3/uL (ref 3.8–10.8)

## 2018-10-16 LAB — HEMOGLOBIN A1C
Hgb A1c MFr Bld: 8.2 % of total Hgb — ABNORMAL HIGH (ref <5.7)
Mean Plasma Glucose: 189 (calc)
eAG (mmol/L): 10.4 (calc)

## 2018-10-16 LAB — LIPID PANEL
Cholesterol: 174 mg/dL (ref <200)
HDL: 48 mg/dL — ABNORMAL LOW (ref >=50)
LDL Cholesterol (Calc): 98 mg/dL (calc)
Non-HDL Cholesterol (Calc): 126 mg/dL (calc) (ref <130)
Total CHOL/HDL Ratio: 3.6 (calc) (ref <5.0)
Triglycerides: 183 mg/dL — ABNORMAL HIGH (ref <150)

## 2018-10-16 LAB — MICROALBUMIN, URINE: Microalb, Ur: 1 mg/dL

## 2018-10-18 ENCOUNTER — Ambulatory Visit (INDEPENDENT_AMBULATORY_CARE_PROVIDER_SITE_OTHER): Payer: BC Managed Care – PPO | Admitting: Neurology

## 2018-10-18 ENCOUNTER — Encounter: Payer: Self-pay | Admitting: Neurology

## 2018-10-18 ENCOUNTER — Other Ambulatory Visit: Payer: Self-pay

## 2018-10-18 VITALS — BP 130/85 | HR 73 | Ht 66.0 in | Wt 225.0 lb

## 2018-10-18 DIAGNOSIS — R0683 Snoring: Secondary | ICD-10-CM

## 2018-10-18 DIAGNOSIS — G4719 Other hypersomnia: Secondary | ICD-10-CM | POA: Diagnosis not present

## 2018-10-18 DIAGNOSIS — R351 Nocturia: Secondary | ICD-10-CM | POA: Diagnosis not present

## 2018-10-18 DIAGNOSIS — E669 Obesity, unspecified: Secondary | ICD-10-CM | POA: Diagnosis not present

## 2018-10-18 DIAGNOSIS — R7981 Abnormal blood-gas level: Secondary | ICD-10-CM

## 2018-10-18 NOTE — Patient Instructions (Signed)

## 2018-10-18 NOTE — Progress Notes (Signed)
Subjective:    Patient ID: Mckenzie Tyler is a 48 y.o. female.  HPI     Star Age, MD, PhD [facility] [address] [place], [place] [address]  Dear Dr. Dennard Schaumann,  I saw your patient, Mckenzie Tyler, upon your kind request to my sleep clinic today for initial consultation of her sleep disorder, in particular, concern for underlying obstructive sleep apnea.  The patient is unaccompanied today.  As you know, Ms. Mckenzie Tyler is a 48 year old right-handed woman with an underlying medical history of diabetes, prior smoking, depression, hyperlipidemia and obesity, who reports snoring and excessive daytime somnolence, as well as recent difficulty with focus, reportedly she also had a recent abnormal overnight pulse oximetry test.  I reviewed your office note from 10/15/2018.  Her pulse oximetry was done in mid August, she did have desaturations into the 80s.  We will request test results from [facility] as I could not pull up the results in epic. Her Epworth sleepiness score is 16 out of 24, fatigue severity score is 42 out of 63.  She has been noted to make puffing sounds while asleep per sister.  Her younger sister has sleep apnea and uses a CPAP machine.  The patient is single and lives alone, no kids.  She has 1 dog who sleeps in the bedroom with her.  She has no TV in the bedroom.  She drinks caffeine and limitation, soda every other day, occasional tea, typically no coffee, tries to be in bed around 10 and rise time is 5 AM.  She has a 45-minute commute to [place].  She works from home some.  She works as a Ambulance person.  She is a non-smoker and quit almost a year ago after smoking for about 25 years.  She denies morning headaches but has nocturia about twice per average night.  She had a tonsillectomy as a child.  She has had some weight fluctuations, some weight gain in the realm of 10 pounds in the past 6 months or so.   Her Past  Medical History Is Significant For: Past Medical History:  Diagnosis Date  . Depression   . Diabetes mellitus without complication ([place])   . Hyperlipidemia 02/13/2013  . Smoker     Her Past Surgical History Is Significant For: No past surgical history on file.  Her Family History Is Significant For: Family History  Problem Relation Age of Onset  . Diabetes Father   . Cancer Neg Hx   . Heart disease Neg Hx     Her Social History Is Significant For: Social History   Socioeconomic History  . Marital status: Single    Spouse name: Not on file  . Number of children: Not on file  . Years of education: Not on file  . Highest education level: Not on file  Occupational History  . Not on file  Social Needs  . Financial resource strain: Not on file  . Food insecurity    Worry: Not on file    Inability: Not on file  . Transportation needs    Medical: Not on file    Non-medical: Not on file  Tobacco Use  . Smoking status: Former Smoker    Packs/day: 0.25    Types: Cigarettes    Quit date: 01/21/2018    Years since quitting: 0.7  . Smokeless tobacco: Never Used  . Tobacco comment: has been trying to quit  Substance and Sexual Activity  . Alcohol  use: No    Alcohol/week: 0.0 standard drinks  . Drug use: No  . Sexual activity: Yes    Birth control/protection: None  Lifestyle  . Physical activity    Days per week: Not on file    Minutes per session: Not on file  . Stress: Not on file  Relationships  . Social Herbalist on phone: Not on file    Gets together: Not on file    Attends religious service: Not on file    Active member of club or organization: Not on file    Attends meetings of clubs or organizations: Not on file    Relationship status: Not on file  Other Topics Concern  . Not on file  Social History Narrative  . Not on file    Her Allergies Are:  No Known Allergies:   Her Current Medications Are:  Outpatient Encounter Medications as of  10/18/2018  Medication Sig  . Dulaglutide (TRULICITY) 1.5 0000000 SOPN Injection 1.5 mg SQ Weekly  . insulin aspart (NOVOLOG FLEXPEN) 100 UNIT/ML FlexPen Inject 7 Units into the skin 3 (three) times daily with meals.  . insulin glargine (LANTUS) 100 UNIT/ML injection Inject 1 mL (100 Units total) into the skin at bedtime. (Patient taking differently: Inject 60 Units into the skin at bedtime. )  . insulin lispro (HUMALOG KWIKPEN) 100 UNIT/ML KwikPen Inject 0.07 mLs (7 Units total) into the skin 3 (three) times daily.  . Insulin Pen Needle (PEN NEEDLES) 32G X 5 MM MISC 1 Units by Does not apply route 3 (three) times daily.  . Insulin Syringe-Needle U-100 25G X 1" 1 ML MISC Use as directed  . losartan (COZAAR) 25 MG tablet Take 1 tablet (25 mg total) by mouth daily.  . Magnesium 400 MG TABS Take by mouth.  . metFORMIN (GLUCOPHAGE) 1000 MG tablet Take 1 tablet (1,000 mg total) by mouth 2 (two) times daily with a meal.  . Multiple Vitamin (MULTIVITAMIN) tablet Take 1 tablet by mouth daily.  . pioglitazone (ACTOS) 30 MG tablet Take 1 tablet (30 mg total) by mouth daily.  Marland Kitchen POTASSIUM PO Take by mouth.  . simvastatin (ZOCOR) 20 MG tablet TAKE ONE TABLET BY MOUTH ONCE DAILY AT  6PM  . [DISCONTINUED] terbinafine (LAMISIL) 250 MG tablet Take 1 tablet (250 mg total) by mouth daily.   No facility-administered encounter medications on file as of 10/18/2018.   :  Review of Systems:  Out of a complete 14 point review of systems, all are reviewed and negative with the exception of these symptoms as listed below: Review of Systems  Neurological:       Pt presents today to discuss her sleep. Pt has never had a sleep study but does endorse snoring.  Epworth Sleepiness Scale 0= would never doze 1= slight chance of dozing 2= moderate chance of dozing 3= high chance of dozing  Sitting and reading: 2 Watching TV: 2 Sitting inactive in a public place (ex. Theater or meeting): 2 As a passenger in a car for  an hour without a break: 3 Lying down to rest in the afternoon: 3 Sitting and talking to someone: 1 Sitting quietly after lunch (no alcohol): 2 In a car, while stopped in traffic: 1 Total: 16     Objective:  Neurological Exam  Physical Exam Physical Examination:   Vitals:   10/18/18 0859  BP: 130/85  Pulse: 73    General Examination: The patient is a very pleasant 48 y.o.  female in no acute distress. She appears well-developed and well-nourished and well groomed.   HEENT: Normocephalic, atraumatic, pupils are equal, round and reactive to light and accommodation. Corrective eye glasses in place. Extraocular tracking is good without limitation to gaze excursion or nystagmus noted. Normal smooth pursuit is noted. Hearing is grossly intact. Face is symmetric with normal facial animation and normal facial sensation. Speech is clear with no dysarthria noted. There is no hypophonia. There is no lip, neck/head, jaw or voice tremor. Neck is supple with full range of passive and active motion. There are no carotid bruits on auscultation. Oropharynx exam reveals: mild mouth dryness, adequate dental hygiene and mild airway crowding, due to Small airway opening, uvula on the smaller side, tonsils are absent, normal tongue, tongue protrudes centrally and palate elevates symmetrically, Mallampati is class I, neck circumference is 16-3/8 inches.  She has a minimal overbite.Nasal inspection shows benign findings, no septal deviation.  Chest: Clear to auscultation without wheezing, rhonchi or crackles noted.  Heart: S1+S2+0, regular and normal without murmurs, rubs or gallops noted.   Abdomen: Soft, non-tender and non-distended with normal bowel sounds appreciated on auscultation.  Extremities: There is no pitting edema in the distal lower extremities bilaterally.   Skin: Warm and dry without trophic changes noted.  Musculoskeletal: exam reveals no obvious joint deformities, tenderness or joint  swelling or erythema.   Neurologically:  Mental status: The patient is awake, alert and oriented in all 4 spheres. Her immediate and remote memory, attention, language skills and fund of knowledge are appropriate. There is no evidence of aphasia, agnosia, apraxia or anomia. Speech is clear with normal prosody and enunciation. Thought process is linear. Mood is normal and affect is normal.  Cranial nerves II - XII are as described above under HEENT exam.  Motor exam: Normal bulk, strength and tone is noted. There is no tremor. Romberg is negative. Fine motor skills and coordination: grossly intact.  Cerebellar testing: No dysmetria or intention tremor. There is no truncal or gait ataxia.  Sensory exam: intact to light touch.  Gait, station and balance: She stands easily. No veering to one side is noted. No leaning to one side is noted. Posture is age-appropriate and stance is narrow based. Gait shows normal stride length and normal pace. No problems turning are noted. Tandem walk is unremarkable.  Assessment and Plan:  In summary, Shuntel Jendro is a very pleasant 47 y.o.-year old female with an underlying medical history of diabetes, prior smoking, depression, hyperlipidemia and obesity, who Presents for evaluation of her sleep difficulties.  Her history and examination are concerning for underlying obstructive sleep apnea (OSA). I had a long chat with the patient about my findings and the diagnosis of OSA, its prognosis and treatment options. We talked about medical treatments, surgical interventions and non-pharmacological approaches. I explained in particular the risks and ramifications of untreated moderate to severe OSA, especially with respect to developing cardiovascular disease down the Road, including congestive heart failure, difficult to treat hypertension, cardiac arrhythmias, or stroke. Even type 2 diabetes has, in part, been linked to untreated OSA. Symptoms of untreated OSA include  daytime sleepiness, memory problems, mood irritability and mood disorder such as depression and anxiety, lack of energy, as well as recurrent headaches, especially morning headaches. We talked about ongoing smoking cessation and trying to maintain a healthy lifestyle in general, as well as the importance of weight control. I encouraged the patient to eat healthy, exercise daily and keep well hydrated, to keep  a scheduled bedtime and wake time routine, to not skip any meals and eat healthy snacks in between meals. I advised the patient not to drive when feeling sleepy. I recommended the following at this time: sleep study.   I explained the sleep test procedure to the patient and also outlined possible surgical and non-surgical treatment options of OSA, including the use of a custom-made dental device (which would require a referral to a specialist dentist or oral surgeon), upper airway surgical options, such as pillar implants, radiofrequency surgery, tongue base surgery, and UPPP (which would involve a referral to an ENT surgeon). Rarely, jaw surgery such as mandibular advancement may be considered.  I also explained the CPAP treatment option to the patient, who indicated that she would be willing to try CPAP if the need arises. I explained the importance of being compliant with PAP treatment, not only for insurance purposes but primarily to improve Her symptoms, and for the patient's long term health benefit, including to reduce Her cardiovascular risks. I answered all her questions today and the patient was in agreement. I plan to see her back after the sleep study is completed and encouraged her to call with any interim questions, concerns, problems or updates.   Thank you very much for allowing me to participate in the care of this nice patient. If I can be of any further assistance to you please do not hesitate to call me at [phone].  Sincerely,   Star Age, MD, PhD

## 2018-10-22 ENCOUNTER — Ambulatory Visit: Payer: BC Managed Care – PPO | Admitting: Family Medicine

## 2018-11-01 ENCOUNTER — Other Ambulatory Visit: Payer: Self-pay

## 2018-11-01 ENCOUNTER — Ambulatory Visit (AMBULATORY_SURGERY_CENTER): Payer: Self-pay | Admitting: *Deleted

## 2018-11-01 VITALS — Temp 97.5°F | Ht 66.0 in | Wt 226.0 lb

## 2018-11-01 DIAGNOSIS — Z1211 Encounter for screening for malignant neoplasm of colon: Secondary | ICD-10-CM

## 2018-11-01 MED ORDER — SUPREP BOWEL PREP KIT 17.5-3.13-1.6 GM/177ML PO SOLN: 1.0000 | Freq: Once | ORAL | 0 refills | Status: AC

## 2018-11-01 NOTE — Progress Notes (Signed)

## 2018-11-02 ENCOUNTER — Encounter: Payer: Self-pay | Admitting: Gastroenterology

## 2018-11-09 ENCOUNTER — Encounter: Payer: Self-pay | Admitting: Podiatry

## 2018-11-09 ENCOUNTER — Ambulatory Visit (INDEPENDENT_AMBULATORY_CARE_PROVIDER_SITE_OTHER): Payer: BC Managed Care – PPO | Admitting: Podiatry

## 2018-11-09 ENCOUNTER — Other Ambulatory Visit: Payer: Self-pay

## 2018-11-09 DIAGNOSIS — B351 Tinea unguium: Secondary | ICD-10-CM | POA: Diagnosis not present

## 2018-11-09 DIAGNOSIS — M79674 Pain in right toe(s): Secondary | ICD-10-CM | POA: Diagnosis not present

## 2018-11-09 DIAGNOSIS — M79675 Pain in left toe(s): Secondary | ICD-10-CM

## 2018-11-09 DIAGNOSIS — L84 Corns and callosities: Secondary | ICD-10-CM

## 2018-11-09 DIAGNOSIS — E119 Type 2 diabetes mellitus without complications: Secondary | ICD-10-CM

## 2018-11-09 NOTE — Patient Instructions (Signed)
Diabetes Mellitus and Foot Care Foot care is an important part of your health, especially when you have diabetes. Diabetes may cause you to have problems because of poor blood flow (circulation) to your feet and legs, which can cause your skin to:  Become thinner and drier.  Break more easily.  Heal more slowly.  Peel and crack. You may also have nerve damage (neuropathy) in your legs and feet, causing decreased feeling in them. This means that you may not notice minor injuries to your feet that could lead to more serious problems. Noticing and addressing any potential problems early is the best way to prevent future foot problems. How to care for your feet Foot hygiene  Wash your feet daily with warm water and mild soap. Do not use hot water. Then, pat your feet and the areas between your toes until they are completely dry. Do not soak your feet as this can dry your skin.  Trim your toenails straight across. Do not dig under them or around the cuticle. File the edges of your nails with an emery board or nail file.  Apply a moisturizing lotion or petroleum jelly to the skin on your feet and to dry, brittle toenails. Use lotion that does not contain alcohol and is unscented. Do not apply lotion between your toes. Shoes and socks  Wear clean socks or stockings every day. Make sure they are not too tight. Do not wear knee-high stockings since they may decrease blood flow to your legs.  Wear shoes that fit properly and have enough cushioning. Always look in your shoes before you put them on to be sure there are no objects inside.  To break in new shoes, wear them for just a few hours a day. This prevents injuries on your feet. Wounds, scrapes, corns, and calluses  Check your feet daily for blisters, cuts, bruises, sores, and redness. If you cannot see the bottom of your feet, use a mirror or ask someone for help.  Do not cut corns or calluses or try to remove them with medicine.  If you  find a minor scrape, cut, or break in the skin on your feet, keep it and the skin around it clean and dry. You may clean these areas with mild soap and water. Do not clean the area with peroxide, alcohol, or iodine.  If you have a wound, scrape, corn, or callus on your foot, look at it several times a day to make sure it is healing and not infected. Check for: ? Redness, swelling, or pain. ? Fluid or blood. ? Warmth. ? Pus or a bad smell. General instructions  Do not cross your legs. This may decrease blood flow to your feet.  Do not use heating pads or hot water bottles on your feet. They may burn your skin. If you have lost feeling in your feet or legs, you may not know this is happening until it is too late.  Protect your feet from hot and cold by wearing shoes, such as at the beach or on hot pavement.  Schedule a complete foot exam at least once a year (annually) or more often if you have foot problems. If you have foot problems, report any cuts, sores, or bruises to your health care provider immediately. Contact a health care provider if:  You have a medical condition that increases your risk of infection and you have any cuts, sores, or bruises on your feet.  You have an injury that is not   healing.  You have redness on your legs or feet.  You feel burning or tingling in your legs or feet.  You have pain or cramps in your legs and feet.  Your legs or feet are numb.  Your feet always feel cold.  You have pain around a toenail. Get help right away if:  You have a wound, scrape, corn, or callus on your foot and: ? You have pain, swelling, or redness that gets worse. ? You have fluid or blood coming from the wound, scrape, corn, or callus. ? Your wound, scrape, corn, or callus feels warm to the touch. ? You have pus or a bad smell coming from the wound, scrape, corn, or callus. ? You have a fever. ? You have a red line going up your leg. Summary  Check your feet every day  for cuts, sores, red spots, swelling, and blisters.  Moisturize feet and legs daily.  Wear shoes that fit properly and have enough cushioning.  If you have foot problems, report any cuts, sores, or bruises to your health care provider immediately.  Schedule a complete foot exam at least once a year (annually) or more often if you have foot problems. This information is not intended to replace advice given to you by your health care provider. Make sure you discuss any questions you have with your health care provider. Document Released: 01/15/2000 Document Revised: 03/01/2017 Document Reviewed: 02/19/2016 Elsevier Patient Education  2020 [employer]  Corns and Calluses Corns are small areas of thickened skin that occur on the top, sides, or tip of a toe. They contain a cone-shaped core with a point that can press on a nerve below. This causes pain.  Calluses are areas of thickened skin that can occur anywhere on the body, including the hands, fingers, palms, soles of the feet, and heels. Calluses are usually larger than corns. What are the causes? Corns and calluses are caused by rubbing (friction) or pressure, such as from shoes that are too tight or do not fit properly. What increases the risk? Corns are more likely to develop in people who have misshapen toes (toe deformities), such as hammer toes. Calluses can occur with friction to any area of the skin. They are more likely to develop in people who:  Work with their hands.  Wear shoes that fit poorly, are too tight, or are high-heeled.  Have toe deformities. What are the signs or symptoms? Symptoms of a corn or callus include:  A hard growth on the skin.  Pain or tenderness under the skin.  Redness and swelling.  Increased discomfort while wearing tight-fitting shoes, if your feet are affected. If a corn or callus becomes infected, symptoms may include:  Redness and swelling that gets worse.  Pain.  Fluid, blood, or  pus draining from the corn or callus. How is this diagnosed? Corns and calluses may be diagnosed based on your symptoms, your medical history, and a physical exam. How is this treated? Treatment for corns and calluses may include:  Removing the cause of the friction or pressure. This may involve: ? Changing your shoes. ? Wearing shoe inserts (orthotics) or other protective layers in your shoes, such as a corn pad. ? Wearing gloves.  Applying medicine to the skin (topical medicine) to help soften skin in the hardened, thickened areas.  Removing layers of dead skin with a file to reduce the size of the corn or callus.  Removing the corn or callus with a scalpel or   laser.  Taking antibiotic medicines, if your corn or callus is infected.  Having surgery, if a toe deformity is the cause. Follow these instructions at home:   Take over-the-counter and prescription medicines only as told by your health care provider.  If you were prescribed an antibiotic, take it as told by your health care provider. Do not stop taking it even if your condition starts to improve.  Wear shoes that fit well. Avoid wearing high-heeled shoes and shoes that are too tight or too loose.  Wear any padding, protective layers, gloves, or orthotics as told by your health care provider.  Soak your hands or feet and then use a file or pumice stone to soften your corn or callus. Do this as told by your health care provider.  Check your corn or callus every day for symptoms of infection. Contact a health care provider if you:  Notice that your symptoms do not improve with treatment.  Have redness or swelling that gets worse.  Notice that your corn or callus becomes painful.  Have fluid, blood, or pus coming from your corn or callus.  Have new symptoms. Summary  Corns are small areas of thickened skin that occur on the top, sides, or tip of a toe.  Calluses are areas of thickened skin that can occur anywhere  on the body, including the hands, fingers, palms, and soles of the feet. Calluses are usually larger than corns.  Corns and calluses are caused by rubbing (friction) or pressure, such as from shoes that are too tight or do not fit properly.  Treatment may include wearing any padding, protective layers, gloves, or orthotics as told by your health care provider. This information is not intended to replace advice given to you by your health care provider. Make sure you discuss any questions you have with your health care provider. Document Released: 10/24/2003 Document Revised: 05/09/2018 Document Reviewed: 11/30/2016 Elsevier Patient Education  2020 [employer]  Onychomycosis/Fungal Toenails  WHAT IS IT? An infection that lies within the keratin of your nail plate that is caused by a fungus.  WHY ME? Fungal infections affect all ages, sexes, races, and creeds.  There may be many factors that predispose you to a fungal infection such as age, coexisting medical conditions such as diabetes, or an autoimmune disease; stress, medications, fatigue, genetics, etc.  Bottom line: fungus thrives in a warm, moist environment and your shoes offer such a location.  IS IT CONTAGIOUS? Theoretically, yes.  You do not want to share shoes, nail clippers or files with someone who has fungal toenails.  Walking around barefoot in the same room or sleeping in the same bed is unlikely to transfer the organism.  It is important to realize, however, that fungus can spread easily from one nail to the next on the same foot.  HOW DO WE TREAT THIS?  There are several ways to treat this condition.  Treatment may depend on many factors such as age, medications, pregnancy, liver and kidney conditions, etc.  It is best to ask your doctor which options are available to you.  1. No treatment.   Unlike many other medical concerns, you can live with this condition.  However for many people this can be a painful condition and may  lead to ingrown toenails or a bacterial infection.  It is recommended that you keep the nails cut short to help reduce the amount of fungal nail. 2. Topical treatment.  These range from herbal remedies to prescription   strength nail lacquers.  About 40-50% effective, topicals require twice daily application for approximately 9 to 12 months or until an entirely new nail has grown out.  The most effective topicals are medical grade medications available through physicians offices. 3. Oral antifungal medications.  With an 80-90% cure rate, the most common oral medication requires 3 to 4 months of therapy and stays in your system for a year as the new nail grows out.  Oral antifungal medications do require blood work to make sure it is a safe drug for you.  A liver function panel will be performed prior to starting the medication and after the first month of treatment.  It is important to have the blood work performed to avoid any harmful side effects.  In general, this medication safe but blood work is required. 4. Laser Therapy.  This treatment is performed by applying a specialized laser to the affected nail plate.  This therapy is noninvasive, fast, and non-painful.  It is not covered by insurance and is therefore, out of pocket.  The results have been very good with a 80-95% cure rate.  [facility] is the only practice in the area to offer this therapy. 5. Permanent Nail Avulsion.  Removing the entire nail so that a new nail will not grow back. 

## 2018-11-12 NOTE — Progress Notes (Signed)
Subjective: Mckenzie Tyler is seen today for follow up preventative diabetic foot care for plantar callus and  painful, elongated, thickened toenails 1-5 b/l feet that she cannot cut. Pain interferes with daily activities. Aggravating factor includes wearing enclosed shoe gear and relieved with periodic debridement.  She voices no new pedal concerns on today's visit.  Current Outpatient Medications on File Prior to Visit  Medication Sig  . calcium carbonate (OS-CAL) 600 MG tablet Take 600 mg by mouth daily.  . Dulaglutide (TRULICITY) 1.5 0000000 SOPN Injection 1.5 mg SQ Weekly  . insulin aspart (NOVOLOG FLEXPEN) 100 UNIT/ML FlexPen Inject 7 Units into the skin 3 (three) times daily with meals.  . insulin glargine (LANTUS) 100 UNIT/ML injection Inject 1 mL (100 Units total) into the skin at bedtime. (Patient taking differently: Inject 60 Units into the skin at bedtime. )  . insulin lispro (HUMALOG KWIKPEN) 100 UNIT/ML KwikPen Inject 0.07 mLs (7 Units total) into the skin 3 (three) times daily.  . Insulin Pen Needle (PEN NEEDLES) 32G X 5 MM MISC 1 Units by Does not apply route 3 (three) times daily.  . Insulin Syringe-Needle U-100 25G X 1" 1 ML MISC Use as directed  . losartan (COZAAR) 25 MG tablet Take 1 tablet (25 mg total) by mouth daily.  . Magnesium 400 MG TABS Take by mouth.  . metFORMIN (GLUCOPHAGE) 1000 MG tablet Take 1 tablet (1,000 mg total) by mouth 2 (two) times daily with a meal.  . Multiple Vitamin (MULTIVITAMIN) tablet Take 1 tablet by mouth daily.  Marland Kitchen OVER THE COUNTER MEDICATION daily. Selinium 200 mg herbal supplement daily  . pioglitazone (ACTOS) 30 MG tablet Take 1 tablet (30 mg total) by mouth daily.  Marland Kitchen POTASSIUM PO Take by mouth.  . simvastatin (ZOCOR) 20 MG tablet TAKE ONE TABLET BY MOUTH ONCE DAILY AT  6PM   No current facility-administered medications on file prior to visit.      Allergies  Allergen Reactions  . Codeine Itching     Objective:  Vascular  Examination: Capillary refill time immediate x 10 digits.  Dorsalis pedis present b/l.  Posterior tibial pulses present b/l.  Digital hair absent b/l.  Skin temperature gradient WNL b/l.   Dermatological Examination: Skin with normal turgor, texture and tone b/l.  Toenails 1-5 b/l discolored, thick, dystrophic with subungual debris and pain with palpation to nailbeds due to thickness of nails.  Hyperkeratotic lesion submet head 1 b/l, b/l hallux.  No edema, no erythema, no drainage, no flocculence.  Callus submet head 3 right foot resolved.  Musculoskeletal: Muscle strength 5/5 to all LE muscle groups  No gross bony deformities b/l.  No pain, crepitus or joint limitation noted with ROM.   Neurological Examination: Protective sensation intact with 10 gram monofilament bilaterally.  Epicritic sensation present bilaterally.  Vibratory sensation intact bilaterally.   Assessment: Painful onychomycosis toenails 1-5 b/l  Calluses submet head 1 b/l and b/l hallux NIDDM  Plan: 1. Toenails 1-5 b/l were debrided in length and girth without iatrogenic bleeding.  2. Calluses pared submetatarsal head 1 b/l and b/l hallux utilizing sterile scalpel blade without incident. 3. Patient to continue soft, supportive shoe gear daily. 4. Patient to report any pedal injuries to medical professional immediately. 5. Follow up 3 months.  6. Patient/POA to call should there be a concern in the interim.

## 2018-11-14 ENCOUNTER — Telehealth: Payer: Self-pay

## 2018-11-14 NOTE — Telephone Encounter (Signed)
Covid-19 screening questions   Do you now or have you had a fever in the last 14 days?  Do you have any respiratory symptoms of shortness of breath or cough now or in the last 14 days?  Do you have any family members or close contacts with diagnosed or suspected Covid-19 in the past 14 days?  Have you been tested for Covid-19 and found to be positive?       

## 2018-11-15 ENCOUNTER — Encounter: Payer: Self-pay | Admitting: Gastroenterology

## 2018-11-15 ENCOUNTER — Ambulatory Visit (AMBULATORY_SURGERY_CENTER): Payer: BC Managed Care – PPO | Admitting: Gastroenterology

## 2018-11-15 ENCOUNTER — Other Ambulatory Visit: Payer: Self-pay

## 2018-11-15 ENCOUNTER — Other Ambulatory Visit: Payer: Self-pay | Admitting: Gastroenterology

## 2018-11-15 VITALS — BP 115/73 | HR 74 | Temp 97.9°F | Resp 12 | Ht 66.0 in | Wt 226.0 lb

## 2018-11-15 DIAGNOSIS — Z1211 Encounter for screening for malignant neoplasm of colon: Secondary | ICD-10-CM | POA: Diagnosis present

## 2018-11-15 DIAGNOSIS — K635 Polyp of colon: Secondary | ICD-10-CM | POA: Diagnosis not present

## 2018-11-15 DIAGNOSIS — D125 Benign neoplasm of sigmoid colon: Secondary | ICD-10-CM

## 2018-11-15 DIAGNOSIS — D120 Benign neoplasm of cecum: Secondary | ICD-10-CM

## 2018-11-15 MED ORDER — SODIUM CHLORIDE 0.9 % IV SOLN: 500.0000 mL | Freq: Once | INTRAVENOUS | Status: DC

## 2018-11-15 NOTE — Progress Notes (Signed)
Called to room to assist during endoscopic procedure.  Patient ID and intended procedure confirmed with present staff. Received instructions for my participation in the procedure from the performing physician.  

## 2018-11-15 NOTE — Patient Instructions (Signed)
Read all the handouts given to you by your recovery room nurse.  You will need another colonoscopy in 3 years.  YOU HAD AN ENDOSCOPIC PROCEDURE TODAY AT [facility]:   Refer to the procedure report that was given to you for any specific questions about what was found during the examination.  If the procedure report does not answer your questions, please call your gastroenterologist to clarify.  If you requested that your care partner not be given the details of your procedure findings, then the procedure report has been included in a sealed envelope for you to review at your convenience later.  YOU SHOULD EXPECT: Some feelings of bloating in the abdomen. Passage of more gas than usual.  Walking can help get rid of the air that was put into your GI tract during the procedure and reduce the bloating. If you had a lower endoscopy (such as a colonoscopy or flexible sigmoidoscopy) you may notice spotting of blood in your stool or on the toilet paper. If you underwent a bowel prep for your procedure, you may not have a normal bowel movement for a few days.  Please Note:  You might notice some irritation and congestion in your nose or some drainage.  This is from the oxygen used during your procedure.  There is no need for concern and it should clear up in a day or so.  SYMPTOMS TO REPORT IMMEDIATELY:   Following lower endoscopy (colonoscopy or flexible sigmoidoscopy):  Excessive amounts of blood in the stool  Significant tenderness or worsening of abdominal pains  Swelling of the abdomen that is new, acute  Fever of 100F or higher   For urgent or emergent issues, a gastroenterologist can be reached at any hour by calling [phone].   DIET:  We do recommend a small meal at first, but then you may proceed to your regular diet.  Drink plenty of fluids but you should avoid alcoholic beverages for 24 hours.  ACTIVITY:  You should plan to take it easy for the rest of today and you  should NOT DRIVE or use heavy machinery until tomorrow (because of the sedation medicines used during the test).    FOLLOW UP: Our staff will call the number listed on your records 48-72 hours following your procedure to check on you and address any questions or concerns that you may have regarding the information given to you following your procedure. If we do not reach you, we will leave a message.  We will attempt to reach you two times.  During this call, we will ask if you have developed any symptoms of COVID 19. If you develop any symptoms (ie: fever, flu-like symptoms, shortness of breath, cough etc.) before then, please call [phone].  If you test positive for Covid 19 in the 2 weeks post procedure, please call and report this information to [place].    If any biopsies were taken you will be contacted by phone or by letter within the next 1-3 weeks.  Please call us at [phone] if you have not heard about the biopsies in 3 weeks.    SIGNATURES/CONFIDENTIALITY: You and/or your care partner have signed paperwork which will be entered into your electronic medical record.  These signatures attest to the fact that that the information above on your After Visit Summary has been reviewed and is understood.  Full responsibility of the confidentiality of this discharge information lies with you and/or your care-partner.

## 2018-11-15 NOTE — Progress Notes (Signed)
To PACU, VSS. Report to [email or web address] 

## 2018-11-15 NOTE — Op Note (Signed)
[facility] Patient Name: Mckenzie Tyler Procedure Date: 11/15/2018 11:00 AM MRN: [id number] Endoscopist: Mauri Pole , MD Age: 48 Referring MD:  Date of Birth: 1970-06-22 Gender: Female Account #: [id number] Procedure:                Colonoscopy Indications:              Screening for colorectal malignant neoplasm Medicines:                Monitored Anesthesia Care Procedure:                Pre-Anesthesia Assessment:                           - Prior to the procedure, a History and Physical                            was performed, and patient medications and                            allergies were reviewed. The patient's tolerance of                            previous anesthesia was also reviewed. The risks                            and benefits of the procedure and the sedation                            options and risks were discussed with the patient.                            All questions were answered, and informed consent                            was obtained. Prior Anticoagulants: The patient has                            taken no previous anticoagulant or antiplatelet                            agents. ASA Grade Assessment: III - A patient with                            severe systemic disease. After reviewing the risks                            and benefits, the patient was deemed in                            satisfactory condition to undergo the procedure.                           After obtaining informed consent, the colonoscope  was passed under direct vision. Throughout the                            procedure, the patient's blood pressure, pulse, and                            oxygen saturations were monitored continuously. The                            Colonoscope was introduced through the anus and                            advanced to the the cecum, identified by                            appendiceal  orifice and ileocecal valve. The                            colonoscopy was performed without difficulty. The                            patient tolerated the procedure well. The quality                            of the bowel preparation was good. The terminal                            ileum, ileocecal valve, appendiceal orifice, and                            rectum were photographed. Scope In: 11:09:55 AM Scope Out: 11:35:04 AM Scope Withdrawal Time: 0 hours 21 minutes 45 seconds  Total Procedure Duration: 0 hours 25 minutes 9 seconds  Findings:                 The perianal and digital rectal examinations were                            normal.                           A 12 mm polyp was found in the ileocecal valve. The                            polyp was sessile. The polyp was removed with a hot                            snare. Resection and retrieval were complete.                           Two sessile polyps were found in the sigmoid colon.                            The polyps were 5 to 7 mm in size. These  polyps                            were removed with a cold snare. Resection and                            retrieval were complete.                           Non-bleeding internal hemorrhoids were found during                            retroflexion. The hemorrhoids were small. Complications:            No immediate complications. Estimated Blood Loss:     Estimated blood loss was minimal. Impression:               - One 12 mm polyp at the ileocecal valve, removed                            with a hot snare. Resected and retrieved.                           - Two 5 to 7 mm polyps in the sigmoid colon,                            removed with a cold snare. Resected and retrieved.                           - Non-bleeding internal hemorrhoids. Recommendation:           - Patient has a contact number available for                            emergencies. The signs and symptoms of  potential                            delayed complications were discussed with the                            patient. Return to normal activities tomorrow.                            Written discharge instructions were provided to the                            patient.                           - Resume previous diet.                           - Continue present medications.                           - Await pathology results.                           -  Repeat colonoscopy in 3 years for surveillance                            based on pathology results. Mauri Pole, MD 11/15/2018 11:40:04 AM This report has been signed electronically.

## 2018-11-19 ENCOUNTER — Telehealth: Payer: Self-pay

## 2018-11-19 ENCOUNTER — Telehealth: Payer: Self-pay | Admitting: *Deleted

## 2018-11-19 NOTE — Telephone Encounter (Signed)
No answer, message left for the patient. 

## 2018-11-19 NOTE — Telephone Encounter (Signed)
No answer, left message to call back later today, B.Laurel Harnden RN. 

## 2018-11-20 ENCOUNTER — Encounter: Payer: Self-pay | Admitting: Gastroenterology

## 2018-11-23 ENCOUNTER — Other Ambulatory Visit: Payer: Self-pay

## 2018-11-23 ENCOUNTER — Ambulatory Visit (INDEPENDENT_AMBULATORY_CARE_PROVIDER_SITE_OTHER): Payer: BC Managed Care – PPO | Admitting: Neurology

## 2018-11-23 DIAGNOSIS — R351 Nocturia: Secondary | ICD-10-CM

## 2018-11-23 DIAGNOSIS — G4719 Other hypersomnia: Secondary | ICD-10-CM

## 2018-11-23 DIAGNOSIS — G4733 Obstructive sleep apnea (adult) (pediatric): Secondary | ICD-10-CM

## 2018-11-23 DIAGNOSIS — R7981 Abnormal blood-gas level: Secondary | ICD-10-CM

## 2018-11-23 DIAGNOSIS — E669 Obesity, unspecified: Secondary | ICD-10-CM

## 2018-11-23 DIAGNOSIS — G4720 Circadian rhythm sleep disorder, unspecified type: Secondary | ICD-10-CM

## 2018-11-23 DIAGNOSIS — R0683 Snoring: Secondary | ICD-10-CM

## 2018-11-25 ENCOUNTER — Other Ambulatory Visit: Payer: Self-pay | Admitting: Family Medicine

## 2018-11-26 ENCOUNTER — Encounter: Payer: Self-pay | Admitting: Neurology

## 2018-11-29 NOTE — Procedures (Signed)
PATIENT'S NAME:  Mckenzie Tyler, Mckenzie Tyler DOB:      Apr 05, 1970      MR#:    [id number]     DATE OF RECORDING: 11/23/2018 REFERRING M.D.:  Jenna Luo, MD Study Performed:   Baseline Polysomnogram HISTORY: 48 year old woman with a history of diabetes, prior smoking, depression, hyperlipidemia and obesity, who reports snoring and excessive daytime somnolence, as well as recent abnormal overnight pulse oximetry test. The patient endorsed the Epworth Sleepiness Scale at 16 points. The patient's weight 225 pounds with a height of 66 (inches), resulting in a BMI of 36.1 kg/m2. The patient's neck circumference measured 16.4 inches.  CURRENT MEDICATIONS: Trulicity, Novolog, Lantus, Humalog, Cozaar, Magnesium, Glucophage, Multivitamin, Actos, Potassium, Zocor   PROCEDURE:  This is a multichannel digital polysomnogram utilizing the Somnostar 11.2 system.  Electrodes and sensors were applied and monitored per AASM Specifications.   EEG, EOG, Chin and Limb EMG, were sampled at 200 Hz.  ECG, Snore and Nasal Pressure, Thermal Airflow, Respiratory Effort, CPAP Flow and Pressure, Oximetry was sampled at 50 Hz. Digital video and audio were recorded.      BASELINE STUDY  Lights Out was at 22:21 and Lights On at 05:01.  Total recording time (TRT) was 400 minutes, with a total sleep time (TST) of 361.5 minutes.   The patient's sleep latency was 15 minutes.  REM latency was 71.5 minutes, which is normal. The sleep efficiency was 90.4 %.     SLEEP ARCHITECTURE: WASO (Wake after sleep onset) was 26 minutes with mild sleep fragmentation noted. There were 24 minutes in Stage N1, 178 minutes Stage N2, 35 minutes Stage N3 and 124.5 minutes in Stage REM.  The percentage of Stage N1 was 6.6%, Stage N2 was 49.2%, Stage N3 was 9.7% and Stage R (REM sleep) was 34.4%, which is increased.   RESPIRATORY ANALYSIS:  There were a total of 101 respiratory events:  0 obstructive apneas, 0 central apneas and 0 mixed apneas with a total of 0  apneas and an apnea index (AI) of 0 /hour. There were 101 hypopneas with a hypopnea index of 16.8 /hour. The patient also had 0 respiratory event related arousals (RERAs).      The total APNEA/HYPOPNEA INDEX (AHI) was 16.8 /hour and the total RESPIRATORY DISTURBANCE INDEX was 0. 16.8 /hour.  79 events occurred in REM sleep and 44 events in NREM. The REM AHI was 38.1 /hour, versus a non-REM AHI of 5.6. The patient spent 111 minutes of total sleep time in the supine position and 251 minutes in non-supine. The supine AHI was 23.2 versus a non-supine AHI of 13.9.  OXYGEN SATURATION & C02:  The Wake baseline 02 saturation was 93%, with the lowest being 77%. Time spent below 89% saturation equaled 30 minutes.  PERIODIC LIMB MOVEMENTS: The patient had a total of 13 Periodic Limb Movements.  The Periodic Limb Movement (PLM) index was 2.2 and the PLM Arousal index was 0/hour. The arousals were noted as: 33 were spontaneous, 0 were associated with PLMs, 11 were associated with respiratory events.  Audio and video analysis did not show any abnormal or unusual movements, behaviors, phonations or vocalizations. The patient took one bathroom break. Mild to moderate snoring was noted. The EKG was in keeping with normal sinus rhythm (NSR).  Post-study, the patient indicated that sleep was worse than usual.   IMPRESSION:  1. Obstructive Sleep Apnea (OSA) 2. Dysfunctions associated with sleep stages or arousal from sleep  RECOMMENDATIONS:  1. This study demonstrates moderate to  severe obstructive sleep apnea, with a total AHI of 16.8/hour, REM AHI of 38.1/hour, supine AHI of 23.2/hour and O2 nadir of 77%. Treatment with positive airway pressure in the form of CPAP is recommended. This will require a full night titration study to optimize therapy. Other treatment options may include avoidance of supine sleep position along with weight loss, upper airway or jaw surgery in selected patients or the use of an oral  appliance in certain patients. ENT evaluation and/or consultation with a maxillofacial surgeon or dentist may be feasible in some instances.   2. Please note that untreated obstructive sleep apnea may carry additional perioperative morbidity. Patients with significant obstructive sleep apnea should receive perioperative PAP therapy and the surgeons and particularly the anesthesiologist should be informed of the diagnosis and the severity of the sleep disordered breathing. 3. This study shows sleep fragmentation and abnormal sleep stage percentages; these are nonspecific findings and per se do not signify an intrinsic sleep disorder or a cause for the patient's sleep-related symptoms. Causes include (but are not limited to) the first night effect of the sleep study, circadian rhythm disturbances, medication effect or an underlying mood disorder or medical problem.  4. The patient should be cautioned not to drive, work at heights, or operate dangerous or heavy equipment when tired or sleepy. Review and reiteration of good sleep hygiene measures should be pursued with any patient. 5. The patient will be seen in follow-up in the sleep clinic at [facility] for discussion of the test results, symptom and treatment compliance review, further management strategies, etc. The referring provider will be notified of the test results.  I certify that I have reviewed the entire raw data recording prior to the issuance of this report in accordance with the Standards of Accreditation of the American Academy of Sleep Medicine (AASM)  Star Age, MD, PhD Diplomat, American Board of Neurology and Sleep Medicine (Neurology and Sleep Medicine)

## 2018-11-29 NOTE — Addendum Note (Signed)
Addended by: Star Age on: 11/29/2018 07:08 PM   Modules accepted: Orders

## 2018-11-29 NOTE — Progress Notes (Signed)
Patient referred by Dr. Dennard Schaumann, seen by me on 10/18/18, diagnostic PSG on 11/23/18.   Please call and notify the patient that the recent sleep study showed moderate to severe obstructive sleep apnea. I recommend treatment for this in the form of CPAP. This will require a repeat sleep study for proper titration and mask fitting and correct monitoring of the oxygen saturations. Please explain to patient. I have placed an order in the chart. Thanks.  Star Age, MD, PhD [facility])

## 2018-11-29 NOTE — Progress Notes (Signed)
cpap

## 2018-12-03 ENCOUNTER — Telehealth: Payer: Self-pay

## 2018-12-03 NOTE — Telephone Encounter (Signed)
I called pt to discuss her sleep study results. No answer, left a message asking her to call me back. 

## 2018-12-03 NOTE — Telephone Encounter (Signed)
-----   Message from Star Age, MD sent at 11/29/2018  7:08 PM EDT ----- Patient referred by Dr. Dennard Schaumann, seen by me on 10/18/18, diagnostic PSG on 11/23/18.   Please call and notify the patient that the recent sleep study showed moderate to severe obstructive sleep apnea. I recommend treatment for this in the form of CPAP. This will require a repeat sleep study for proper titration and mask fitting and correct monitoring of the oxygen saturations. Please explain to patient. I have placed an order in the chart. Thanks.  Star Age, MD, PhD [facility])

## 2018-12-05 ENCOUNTER — Ambulatory Visit: Payer: BLUE CROSS/BLUE SHIELD

## 2018-12-10 ENCOUNTER — Other Ambulatory Visit: Payer: Self-pay | Admitting: Family Medicine

## 2018-12-11 NOTE — Telephone Encounter (Signed)
I called pt again to discuss her sleep study results, no answer, left a message asking her to call me back.

## 2018-12-12 NOTE — Telephone Encounter (Signed)
I called pt again to discuss her sleep study results. This is my third unsuccessful attempt at reaching her by phone. Will send her a [employer].

## 2018-12-12 NOTE — Telephone Encounter (Signed)
Pt

## 2018-12-26 ENCOUNTER — Other Ambulatory Visit (HOSPITAL_COMMUNITY)
Admission: RE | Admit: 2018-12-26 | Discharge: 2018-12-26 | Disposition: A | Discharge status: Home / Self Care | Payer: BC Managed Care – PPO | Source: Ambulatory Visit | Attending: Neurology | Admitting: Neurology

## 2018-12-26 ENCOUNTER — Ambulatory Visit
Admission: RE | Admit: 2018-12-26 | Discharge: 2018-12-26 | Disposition: A | Discharge status: Home / Self Care | Payer: BC Managed Care – PPO | Source: Ambulatory Visit | Attending: Family Medicine | Admitting: Family Medicine

## 2018-12-26 ENCOUNTER — Other Ambulatory Visit: Payer: Self-pay

## 2018-12-26 DIAGNOSIS — Z01812 Encounter for preprocedural laboratory examination: Secondary | ICD-10-CM | POA: Insufficient documentation

## 2018-12-26 DIAGNOSIS — Z20828 Contact with and (suspected) exposure to other viral communicable diseases: Secondary | ICD-10-CM | POA: Diagnosis not present

## 2018-12-26 DIAGNOSIS — Z1239 Encounter for other screening for malignant neoplasm of breast: Secondary | ICD-10-CM

## 2018-12-26 LAB — SARS CORONAVIRUS 2 (TAT 6-24 HRS): SARS Coronavirus 2: NEGATIVE

## 2018-12-29 ENCOUNTER — Ambulatory Visit (INDEPENDENT_AMBULATORY_CARE_PROVIDER_SITE_OTHER): Payer: BC Managed Care – PPO | Admitting: Neurology

## 2018-12-29 ENCOUNTER — Other Ambulatory Visit: Payer: Self-pay

## 2018-12-29 DIAGNOSIS — R351 Nocturia: Secondary | ICD-10-CM

## 2018-12-29 DIAGNOSIS — G4733 Obstructive sleep apnea (adult) (pediatric): Secondary | ICD-10-CM

## 2018-12-29 DIAGNOSIS — R7981 Abnormal blood-gas level: Secondary | ICD-10-CM

## 2018-12-29 DIAGNOSIS — E669 Obesity, unspecified: Secondary | ICD-10-CM

## 2018-12-29 DIAGNOSIS — G4720 Circadian rhythm sleep disorder, unspecified type: Secondary | ICD-10-CM

## 2018-12-29 DIAGNOSIS — G4719 Other hypersomnia: Secondary | ICD-10-CM

## 2019-01-08 ENCOUNTER — Telehealth: Payer: Self-pay

## 2019-01-08 NOTE — Progress Notes (Signed)
Patient referred by Dr. Dennard Schaumann, seen by me on 10/18/18, diagnostic PSG on 11/23/18. Patient had a CPAP titration study on 12/29/18.  Please call and inform patient that I have entered an order for treatment with positive airway pressure (PAP) treatment for obstructive sleep apnea (OSA). She did well during the latest sleep study with CPAP. We will, therefore, arrange for a machine for home use through a DME (durable medical equipment) company of Her choice; and I will see the patient back in follow-up in about 10 weeks. Please also explain to the patient that I will be looking out for compliance data, which can be downloaded from the machine (stored on an SD card, that is inserted in the machine) or via remote access through a modem, that is built into the machine. At the time of the followup appointment we will discuss sleep study results and how it is going with PAP treatment at home. Please advise patient to bring Her machine at the time of the first FU visit, even though this is cumbersome. Bringing the machine for every visit after that will likely not be needed, but often helps for the first visit to troubleshoot if needed. Please re-enforce the importance of compliance with treatment and the need for [place] to monitor compliance data - often an insurance requirement and actually good feedback for the patient as far as how they are doing.  Also remind patient, that any interim PAP machine or mask issues should be first addressed with the DME company, as they can often help better with technical and mask fit issues. Please ask if patient has a preference regarding DME company.  Please also make sure, the patient has a follow-up appointment with me in about 10 weeks from the setup date, thanks. May see one of our nurse practitioners if needed for proper timing of the FU appointment.  Please fax or rout report to the referring provider. Thanks,   Star Age, MD, PhD [facility])

## 2019-01-08 NOTE — Addendum Note (Signed)
Addended by: Star Age on: 01/08/2019 08:21 AM   Modules accepted: Orders

## 2019-01-08 NOTE — Telephone Encounter (Signed)
I called pt to discuss her sleep study results. No answer, left a message asking her to call me back. 

## 2019-01-08 NOTE — Telephone Encounter (Signed)
-----   Message from Star Age, MD sent at 01/08/2019  8:21 AM EST ----- Patient referred by Dr. Dennard Schaumann, seen by me on 10/18/18, diagnostic PSG on 11/23/18. Patient had a CPAP titration study on 12/29/18.  Please call and inform patient that I have entered an order for treatment with positive airway pressure (PAP) treatment for obstructive sleep apnea (OSA). She did well during the latest sleep study with CPAP. We will, therefore, arrange for a machine for home use through a DME (durable medical equipment) company of Her choice; and I will see the patient back in follow-up in about 10 weeks. Please also explain to the patient that I will be looking out for compliance data, which can be downloaded from the machine (stored on an SD card, that is inserted in the machine) or via remote access through a modem, that is built into the machine. At the time of the followup appointment we will discuss sleep study results and how it is going with PAP treatment at home. Please advise patient to bring Her machine at the time of the first FU visit, even though this is cumbersome. Bringing the machine for every visit after that will likely not be needed, but often helps for the first visit to troubleshoot if needed. Please re-enforce the importance of compliance with treatment and the need for [place] to monitor compliance data - often an insurance requirement and actually good feedback for the patient as far as how they are doing.  Also remind patient, that any interim PAP machine or mask issues should be first addressed with the DME company, as they can often help better with technical and mask fit issues. Please ask if patient has a preference regarding DME company.  Please also make sure, the patient has a follow-up appointment with me in about 10 weeks from the setup date, thanks. May see one of our nurse practitioners if needed for proper timing of the FU appointment.  Please fax or rout report to the referring provider.  Thanks,   Star Age, MD, PhD [facility])

## 2019-01-08 NOTE — Procedures (Signed)
S PATIENT'S NAME:  Mckenzie Tyler, Mckenzie Tyler DOB:      [birth year]      MR#:    [id number]     DATE OF RECORDING: 12/29/2018 REFERRING M.D.:  Jenna Luo, MD Study Performed:   CPAP  Titration HISTORY: 48 year old woman with a history of diabetes, prior smoking, depression, hyperlipidemia and obesity, who presents for a full night titration study. Her baseline sleep study on 11/28/18 showed a total AHI of 16.8 /hour, REM AHI of 38.1/hour, supine AHI of 23.2 and O2 nadir of 77%. The patient endorsed the Epworth Sleepiness Scale at 16 points. The patient's weight 225 pounds with a height of 66 (inches), resulting in a BMI of 36.1 kg/m2. The patient's neck circumference measured 16.4 inches.  CURRENT MEDICATIONS: Trulicity, Novolog, Lantus, Humalog, Cozaar, Magnesium, Glucophage, Multivitamin, Actos, Potassium, Zocor   PROCEDURE:  This is a multichannel digital polysomnogram utilizing the SomnoStar 11.2 system.  Electrodes and sensors were applied and monitored per AASM Specifications.   EEG, EOG, Chin and Limb EMG, were sampled at 200 Hz.  ECG, Snore and Nasal Pressure, Thermal Airflow, Respiratory Effort, CPAP Flow and Pressure, Oximetry was sampled at 50 Hz. Digital video and audio were recorded.      The patient was fitted with a medium dreamwear FFM, then ResMed F10 FFM. CPAP was initiated at 5 cmH20 with heated humidity per AASM standards and pressure was advanced to 15 cmH20 because of hypopneas, apneas and desaturations. She was then switched to BiPAP of 16/12 cm and further titrated to 18/13 cm to treat supine sleep apnea, but the patient did not sleep well on BiPAP and reported, not tolerating the BiPAP. She also indicated, she typically does not sleep on her back. At a PAP pressure of 15 cmH20 with an EPR of 3 there was a reduction of the AHI to 2.9/hour with non-supine REM sleep achieved and O2 nadir of 91%.   Lights Out was at 22:20 and Lights On at 04:48. Total recording time (TRT) was 386.5  minutes, with a total sleep time (TST) of 319 minutes. The patient's sleep latency was 20 minutes. REM latency was 86 minutes, which is normal. The sleep efficiency was 82.5%.    SLEEP ARCHITECTURE: WASO (Wake after sleep onset) was 49 minutes with mild to moderate sleep fragmentation noted. There were 52 minutes in Stage N1, 131 minutes Stage N2, 37 minutes Stage N3 and 99 minutes in Stage REM.  The percentage of Stage N1 was 16.3%, which is markedly increased, Stage N2 was 41.1%, Stage N3 was 11.6% and Stage R (REM sleep) was 31.%, which is increased, and in keeping with rebound. The arousals were noted as: 18 were spontaneous, 0 were associated with PLMs, 7 were associated with respiratory events.  RESPIRATORY ANALYSIS:  There was a total of 46 respiratory events: 0 obstructive apneas, 9 central apneas and 0 mixed apneas with a total of 9 apneas and an apnea index (AI) of 1.7 /hour. There were 37 hypopneas with a hypopnea index of 7./hour. The patient also had 0 respiratory event related arousals (RERAs).      The total APNEA/HYPOPNEA INDEX  (AHI) was 8.7 /hour and the total RESPIRATORY DISTURBANCE INDEX was 8.7 /hour  5 events occurred in REM sleep and 41 events in NREM. The REM AHI was 3. /hour versus a non-REM AHI of 11.2 /hour.  The patient spent 27 minutes of total sleep time in the supine position and 292 minutes in non-supine. The supine AHI was 75.6, versus a  non-supine AHI of 2.5.  OXYGEN SATURATION & C02:  The baseline 02 saturation was 95%, with the lowest being 88%. Time spent below 89% saturation equaled 0 minutes.  PERIODIC LIMB MOVEMENTS:  The patient had a total of 0 Periodic Limb Movements. The Periodic Limb Movement (PLM) index was 0 and the PLM Arousal index was 0 /hour.  Audio and video analysis did not show any abnormal or unusual movements, behaviors, phonations or vocalizations. The patient took 1 bathroom break. The EKG was in keeping with normal sinus rhythm  (NSR).  IMPRESSION:   1. Obstructive Sleep Apnea (OSA) 2. Dysfunctions associated with sleep stages or arousal from sleep   RECOMMENDATIONS:   1. This study demonstrates significant improvement on the patient's obstructive sleep apnea with CPAP therapy. The patient did not tolerate BiPAP towards the end of the study. I will, therefore, start the patient on home CPAP treatment at a pressure of 15 cm via medium F10 FFM with heated humidity. The patient should be reminded to be fully compliant with PAP therapy to improve sleep related symptoms and decrease long term cardiovascular risks. The patient should be reminded, that it may take up to 3 months to get fully used to using PAP with all planned sleep. The earlier full compliance is achieved, the better long term compliance tends to be. Please note that untreated obstructive sleep apnea may carry additional perioperative morbidity. Patients with significant obstructive sleep apnea should receive perioperative PAP therapy and the surgeons and particularly the anesthesiologist should be informed of the diagnosis and the severity of the sleep disordered breathing. 2. This study shows sleep fragmentation and abnormal sleep stage percentages; these are nonspecific findings and per se do not signify an intrinsic sleep disorder or a cause for the patient's sleep-related symptoms. Causes include (but are not limited to) the first night effect of the sleep study, circadian rhythm disturbances, medication effect or an underlying mood disorder or medical problem.  3. The patient should be cautioned not to drive, work at heights, or operate dangerous or heavy equipment when tired or sleepy. Review and reiteration of good sleep hygiene measures should be pursued with any patient. 4. The patient will be seen in follow-up in the sleep clinic at [facility] for discussion of the test results, symptom and treatment compliance review, further management strategies, etc. The  referring provider will be notified of the test results.   I certify that I have reviewed the entire raw data recording prior to the issuance of this report in accordance with the Standards of Accreditation of the American Academy of Sleep Medicine (AASM)   Star Age, MD, PhD Diplomat, American Board of Neurology and Sleep Medicine (Neurology and Sleep Medicine)

## 2019-01-10 NOTE — Telephone Encounter (Signed)
Pt returning call please call back °

## 2019-01-10 NOTE — Telephone Encounter (Signed)
I called pt. I advised pt that Dr. Rexene Alberts reviewed their sleep study results and found that pt did well with the cpap during her latest sleep study. Dr. Rexene Alberts recommends that pt start a cpap at home. I reviewed PAP compliance expectations with the pt. Pt is agreeable to starting a CPAP. I advised pt that an order will be sent to a DME, [facility], and [facility] will call the pt within about one week after they file with the pt's insurance. [facility] will show the pt how to use the machine, fit for masks, and troubleshoot the CPAP if needed. A follow up appt was made for insurance purposes with Amy, NP on 03/21/19 at 1:30pm. Pt verbalized understanding to arrive 15 minutes early and bring their CPAP. A letter with all of this information in it will be mailed to the pt as a reminder. I verified with the pt that the address we have on file is correct. Pt verbalized understanding of results. Pt had no questions at this time but was encouraged to call back if questions arise. I have sent the order to [facility] and have received confirmation that they have received the order.

## 2019-01-17 ENCOUNTER — Other Ambulatory Visit: Payer: Self-pay

## 2019-01-17 ENCOUNTER — Ambulatory Visit: Payer: BC Managed Care – PPO | Admitting: Family Medicine

## 2019-01-17 ENCOUNTER — Encounter: Payer: Self-pay | Admitting: Family Medicine

## 2019-01-17 ENCOUNTER — Ambulatory Visit (INDEPENDENT_AMBULATORY_CARE_PROVIDER_SITE_OTHER): Payer: BC Managed Care – PPO | Admitting: Family Medicine

## 2019-01-17 VITALS — BP 140/60 | HR 104 | Temp 97.2°F | Resp 20 | Ht 67.0 in | Wt 241.0 lb

## 2019-01-17 DIAGNOSIS — Z794 Long term (current) use of insulin: Secondary | ICD-10-CM | POA: Diagnosis not present

## 2019-01-17 DIAGNOSIS — E119 Type 2 diabetes mellitus without complications: Secondary | ICD-10-CM

## 2019-01-17 NOTE — Progress Notes (Addendum)
Subjective:    Patient ID: Mckenzie Tyler, female    DOB: [birth year], 48 y.o.   MRN: [id number]  Medication Refill    12/20 Patient is a 48 year old Caucasian female here today for checkup.  She has type 2 diabetes mellitus but is insulin-dependent.  She is currently on Lantus.  She takes a varying dose every night based on her sugars.  She guesstimates that she averages 70 units at night.  Her last hemoglobin A1c was fairly well controlled at 7.1.  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  She also sees fluctuating blood sugars.  So while her average is well controlled, her blood pressures fluctuate quite a bit throughout the day.  We spent more than 20 minutes today discussing options including mealtime insulin with fast acting insulin as a correction rather than varying amounts of basal insulin which I think would work better or possibly adding a GLP-1 such as Victoza or Trulicity.  Patient is very interested in Mckenzie Tyler with Trulicity.  She also is interested in smoking cessation.  She has had success in the past with Chantix and would like to try it again.  She denies any chest pain shortness of breath or dyspnea on exertion.  I performed her diabetic foot exam and while her pulses and her sensation are normal, the patient has pre-ulcerative calluses forming on the medial surface of her great toes bilaterally.  At that time, my plan was: I recommended setting a basal dose of Lantus such as 70 units.  I then recommended adding Trulicity and uptitrating 1.5 mg weekly to help control some of the fluctuations in her sugars throughout the day and afford some weight loss.  Patient is interested in this.  However first we will start her on Chantix for smoking cessation and once the nausea from Chantix subsides then we will do the Trulicity.  She is not on any renal protection.  I will check a urine microalbumin and if elevated will start her  on an angiotensin receptor blocker given her history of cough due to ACE inhibitor.  This would also help her blood pressure.  I will also check a fasting lipid panel.  Patient is already had her flu shot.  Refer her to a podiatrist for custom molded inserts due to the pre-ulcerative callus forming on the medial surfaces of both great toes.  04/19/18 Patient is a very pleasant 48 year old Caucasian female here today for follow-up.  Since our last visit, the patient quit smoking!.  I am very proud of her for doing this.  She was taking Chantix to help with her smoking cessation and therefore did not start Trulicity due to concern about nausea.  She has stopped the Chantix now and has been on Trulicity now for the last 2 to 3 weeks.  She is taking 0.75 mg weekly.  She also decreased her insulin dose as we discussed down to 70 units.  However her fasting blood sugars are running high between 160 and 200.  She has not experienced any hypoglycemia on the Trulicity.  She denies any polyuria polydipsia or blurry vision.  She denies any chest pain shortness of breath or dyspnea on exertion.  She does have a plantars wart on the plantar aspect of her right foot.  It is proximal to the third MTP joint.  It is a roughly 4 mm hyperkeratotic papule.  She has an appointment to see her podiatrist in April and I  have recommended having her podiatrist core the lesion out.  Otherwise she is doing well with no concerns.  At that time, my plan was: I am extremely proud of the patient for smoking cessation.  I congratulated her.  I would increase her insulin gradually back to 100 units daily.  I recommended that she do this in 3 to 4-day intervals over the next 1 to 2 weeks unless she sees her fasting blood sugars fall below 80.  She will increase her Lantus back to 100 units.  She will also increase her Trulicity to 1.5 mg weekly.  Hopefully we will see her 2-hour postprandial sugars fall below 160 and her fasting blood sugars fall  below 130 with these changes.  I will check a CBC, CMP, fasting lipid panel, and hemoglobin A1c.  I encouraged the patient to see her podiatrist to have the plantars wart treated.  07/20/18 Patient continues to refrain from smoking.  I am extremely proud of her for this.  Unfortunately her blood pressure is elevated at 148/78.  She is also a diabetic and not on an angiotensin receptor blocker.  She is previously tried an ACE inhibitor in the past and had a cough associated with it.  She is never tried an ARB.  She continues to take insulin 80 units a day.  Her fasting blood sugars are 90-100.  However her 2-hour postprandial sugars are greater than 200.  Furthermore she hates Trulicity.  It causes her to feel extremely nauseated and experienced diarrhea.  She is unable to tolerate the medication.  He also reports mental fog all day.  She states that she wakes up relatively easily however it is almost like her brain does not wake up.  She has a hard time focusing.  She has poor memory.  She feels fatigued.  Her sister has sleep apnea.  Her father has sleep apnea.  She denies any depression.  She has no previous history of ADD.  Has no memory loss.  At that time, my plan was:  Regarding the patient's diabetes, we will continue her basal insulin at 80 units as her fasting sugars are well controlled.  We will discontinue Trulicity and replace with Actos 30 mg a day.  Recheck 2-hour postprandial sugars in 6 weeks and if still elevated consider adding Jardiance.  Because of her elevated blood pressure we will add losartan 25 mg a day and recheck blood pressure in 2 weeks.  Check fasting lipid panel.  Goal LDL cholesterol is less than 100.  Because of her mental fatigue and poor focus, I would recommend performing an overnight pulse oximetry to see if she has hypoxia and if it present, check the patient for obstructive sleep apnea.  If not present consider ADD.  Treat onychomycosis with Lamisil 250 mg daily for up to  90 days.  Check liver test every 30 days to monitor for liver toxicity.  Explained this to the patient  10/15/18 HgA1c was 8.6 in June.  However patient states that her blood sugar is doing much better.  She states that her average blood sugar in the morning is between 110 and 120.  She only occasionally has a low blood sugar which is in the 70s.  She states that her 2-hour postprandial sugars are generally less than 160.  She is only twice seeing her blood sugar over 200 in the evening.  Overall she is doing well.  She has an appointment to see a sleep specialist to evaluate for  obstructive sleep apnea.  Recently her overnight pulse oximetry did reveal nocturnal hypoxia.  That coupled with her poor focus and mental fatigue has me concerned that she may have obstructive sleep apnea.  That work-up is pending.  Her blood pressure today is excellent at 112/80.  She denies any chest pain shortness of breath or dyspnea on exertion.  Patient states that she has not had a mammogram in more than 3 years.  She has not had a colonoscopy ever.  She is overdue for a Pap smear as well.  We discussed all of these today.  She would like me to schedule her for mammogram and a colonoscopy.  She defers the Pap smear for now.  At that time, my plan was: I am very happy with her reported blood sugars.  I anticipate that her hemoglobin A1c will be less than 7.  Patient also states that she has quit smoking and that has been smoke-free now for almost 8 months which I congratulated the patient for.  She has had her flu shot.  Her blood pressure today is well controlled at 112/80.  I will check a fasting lipid panel.  Given her diabetes, I would like her LDL cholesterol to be less than 100.  I will also schedule the patient for a mammogram.  I will schedule the patient for a colonoscopy.  Diabetic foot exam was performed today and is normal.  Based on labs, my recommendations last time were:  "Hemoglobin A1c is 8.2. Goal is less than  7. If her fasting blood sugars are less than 130, we need to focus on her 2-hour postprandial sugars. I would increase her quick acting insulin until her 2-hour postprandial sugars are less than 160. Have the patient check her 2-hour postprandial sugars and report those values to [place] on Friday so that we can make an adjustment in her quick acting insulin"  01/17/19 Patient states that her fasting blood sugars are between 120 and 140.  She is currently taking Lantus 60 units a day.  She is using Humalog at mealtime.  Her postprandial sugars are highly variable and are averaging around 210.  She stopped increasing her NovoLog when she reached 12 units due to concern about hypoglycemia even though her postprandial sugars were still greater than 200.  She is occasionally seeing 300 depending upon if she eats pizza or other carb rich foods.  She denies any hypoglycemic episodes.  She denies any neuropathy in her feet.  She denies any chest pain shortness of breath or dyspnea on exertion.  She is being fitted for her CPAP today.  Her blood pressure today is well controlled at 140/60. Past Medical History:  Diagnosis Date   Allergy    Depression    Diabetes mellitus without complication ([place])    Hyperlipidemia 02/13/2013   Smoker    Past Surgical History:  Procedure Laterality Date   RETINOPATHY SURGERY  11/16/2017   due to diabetes    TONSILLECTOMY AND ADENOIDECTOMY     age 45   Current Outpatient Medications on File Prior to Visit  Medication Sig Dispense Refill   calcium carbonate (OS-CAL) 600 MG tablet Take 600 mg by mouth daily.     Dulaglutide (TRULICITY) 1.5 0000000 SOPN Injection 1.5 mg SQ Weekly (Patient not taking: Reported on 11/15/2018) 4 pen 3   insulin aspart (NOVOLOG FLEXPEN) 100 UNIT/ML FlexPen Inject 7 Units into the skin 3 (three) times daily with meals. 15 mL 1   insulin glargine (LANTUS)  100 UNIT/ML injection Inject 1 mL (100 Units total) into the skin at bedtime.  (Patient taking differently: Inject 60 Units into the skin at bedtime. ) 3 vial 11   insulin lispro (HUMALOG KWIKPEN) 100 UNIT/ML KwikPen Inject 0.07 mLs (7 Units total) into the skin 3 (three) times daily. 15 mL 3   Insulin Pen Needle (PEN NEEDLES) 32G X 5 MM MISC 1 Units by Does not apply route 3 (three) times daily. 100 each 2   Insulin Syringe-Needle U-100 25G X 1" 1 ML MISC Use as directed 100 each 3   losartan (COZAAR) 25 MG tablet Take 1 tablet (25 mg total) by mouth daily. 90 tablet 3   Magnesium 400 MG TABS Take by mouth.     metFORMIN (GLUCOPHAGE) 1000 MG tablet TAKE 1 TABLET BY MOUTH TWICE DAILY WITH A MEAL 180 tablet 1   Multiple Vitamin (MULTIVITAMIN) tablet Take 1 tablet by mouth daily.     OVER THE COUNTER MEDICATION daily. Selinium 200 mg herbal supplement daily     pioglitazone (ACTOS) 30 MG tablet Take 1 tablet (30 mg total) by mouth daily. 90 tablet 3   POTASSIUM PO Take by mouth.     simvastatin (ZOCOR) 20 MG tablet TAKE 1 TABLET BY MOUTH ONCE DAILY AT  6PM 90 tablet 0   No current facility-administered medications on file prior to visit.   Allergies  Allergen Reactions   Codeine Itching   Social History   Socioeconomic History   Marital status: Single    Spouse name: Not on file   Number of children: Not on file   Years of education: Not on file   Highest education level: Not on file  Occupational History   Not on file  Tobacco Use   Smoking status: Former Smoker    Packs/day: 0.25    Types: Cigarettes    Quit date: 01/21/2018    Years since quitting: 0.9   Smokeless tobacco: Never Used   Tobacco comment: has been trying to quit  Substance and Sexual Activity   Alcohol use: No    Alcohol/week: 0.0 standard drinks   Drug use: No   Sexual activity: Yes    Birth control/protection: None  Other Topics Concern   Not on file  Social History Narrative   Not on file   Social Determinants of Health   Financial Resource Strain:      Difficulty of Paying Living Expenses: Not on file  Food Insecurity:    Worried About Charity fundraiser in the Last Year: Not on file   [employer] of Food in the Last Year: Not on file  Transportation Needs:    Lack of Transportation (Medical): Not on file   Lack of Transportation (Non-Medical): Not on file  Physical Activity:    Days of Exercise per Week: Not on file   Minutes of Exercise per Session: Not on file  Stress:    Feeling of Stress : Not on file  Social Connections:    Frequency of Communication with Friends and Family: Not on file   Frequency of Social Gatherings with Friends and Family: Not on file   Attends Religious Services: Not on file   Active Member of Clubs or Organizations: Not on file   Attends Archivist Meetings: Not on file   Marital Status: Not on file  Intimate Partner Violence:    Fear of Current or Ex-Partner: Not on file   Emotionally Abused: Not on file  Physically Abused: Not on file   Sexually Abused: Not on file      Review of Systems  All other systems reviewed and are negative.      Objective:   Physical Exam Vitals reviewed.  Constitutional:      General: She is not in acute distress.    Appearance: She is not ill-appearing.  Cardiovascular:     Rate and Rhythm: Normal rate and regular rhythm.     Pulses: Normal pulses.     Heart sounds: Normal heart sounds. No murmur. No gallop.   Pulmonary:     Effort: Pulmonary effort is normal. No respiratory distress.     Breath sounds: Normal breath sounds. No stridor. No wheezing, rhonchi or rales.  Chest:     Chest wall: No tenderness.  Abdominal:     General: Bowel sounds are normal. There is no distension.     Tenderness: There is no guarding or rebound.     Hernia: No hernia is present.  Musculoskeletal:     Right lower leg: No edema.     Left lower leg: No edema.  Neurological:     Mental Status: She is alert.        Assessment & Plan:  Type 2  diabetes mellitus without complication, with long-term current use of insulin (HCC) - Plan: Hemoglobin A1c, COMPLETE METABOLIC PANEL WITH GFR, Lipid Panel  I spent 20 minutes today with the patient going over treatment options for diabetes including adding Trulicity or Victoza or Ozempic.  Patient did not tolerate these medication due to nausea.  Therefore she prefers not to add them.  After further discussion, I realize now the patient is taking her Humalog after meals.  I explained to her that the medication works better if you take it prior to meals ideally 30 minutes so that it can better counter the rise in blood sugar.  She will try doing that.  I would then also increase her Humalog until 2-hour postprandial sugars are under 160.  I have recommended increasing her Humalog up to 18 units if her 2-hour postprandial sugars are still greater than 200 despite taking Humalog before meals.  She will give me an email update in 1 week with her progress.  Patient is checking her blood sugar 4 times a day (prior to meals) and at bedtime.

## 2019-01-18 ENCOUNTER — Encounter: Payer: Self-pay | Admitting: Family Medicine

## 2019-01-18 LAB — COMPLETE METABOLIC PANEL WITH GFR
AG Ratio: 1.9 (calc) (ref 1.0–2.5)
ALT: 25 U/L (ref 6–29)
AST: 21 U/L (ref 10–35)
Albumin: 4.2 g/dL (ref 3.6–5.1)
Alkaline phosphatase (APISO): 35 U/L — ABNORMAL LOW (ref 37–153)
BUN: 21 mg/dL (ref 7–25)
CO2: 27 mmol/L (ref 20–32)
Calcium: 9.2 mg/dL (ref 8.6–10.4)
Chloride: 104 mmol/L (ref 98–110)
Creat: 0.73 mg/dL (ref 0.50–1.05)
GFR, Est African American: 111 mL/min/{1.73_m2} (ref >=60)
GFR, Est Non African American: 96 mL/min/{1.73_m2} (ref >=60)
Globulin: 2.2 g/dL (calc) (ref 1.9–3.7)
Glucose, Bld: 218 mg/dL — ABNORMAL HIGH (ref 65–99)
Potassium: 4.1 mmol/L (ref 3.5–5.3)
Sodium: 140 mmol/L (ref 135–146)
Total Bilirubin: 0.4 mg/dL (ref 0.2–1.2)
Total Protein: 6.4 g/dL (ref 6.1–8.1)

## 2019-01-18 LAB — LIPID PANEL
Cholesterol: 204 mg/dL — ABNORMAL HIGH (ref <200)
HDL: 40 mg/dL — ABNORMAL LOW (ref >=50)
LDL Cholesterol (Calc): 114 mg/dL (calc) — ABNORMAL HIGH
Non-HDL Cholesterol (Calc): 164 mg/dL (calc) — ABNORMAL HIGH (ref <130)
Total CHOL/HDL Ratio: 5.1 (calc) — ABNORMAL HIGH (ref <5.0)
Triglycerides: 351 mg/dL — ABNORMAL HIGH (ref <150)

## 2019-01-18 LAB — HEMOGLOBIN A1C
Hgb A1c MFr Bld: 7 % of total Hgb — ABNORMAL HIGH (ref <5.7)
Mean Plasma Glucose: 154 (calc)
eAG (mmol/L): 8.5 (calc)

## 2019-02-05 ENCOUNTER — Encounter: Payer: Self-pay | Admitting: Family Medicine

## 2019-02-05 DIAGNOSIS — E118 Type 2 diabetes mellitus with unspecified complications: Secondary | ICD-10-CM | POA: Insufficient documentation

## 2019-02-12 ENCOUNTER — Encounter: Payer: Self-pay | Admitting: Podiatry

## 2019-02-12 ENCOUNTER — Ambulatory Visit (INDEPENDENT_AMBULATORY_CARE_PROVIDER_SITE_OTHER): Payer: BC Managed Care – PPO | Admitting: Podiatry

## 2019-02-12 ENCOUNTER — Other Ambulatory Visit: Payer: Self-pay

## 2019-02-12 DIAGNOSIS — M79675 Pain in left toe(s): Secondary | ICD-10-CM | POA: Diagnosis not present

## 2019-02-12 DIAGNOSIS — L84 Corns and callosities: Secondary | ICD-10-CM

## 2019-02-12 DIAGNOSIS — E119 Type 2 diabetes mellitus without complications: Secondary | ICD-10-CM | POA: Diagnosis not present

## 2019-02-12 DIAGNOSIS — B351 Tinea unguium: Secondary | ICD-10-CM | POA: Diagnosis not present

## 2019-02-12 DIAGNOSIS — M79674 Pain in right toe(s): Secondary | ICD-10-CM

## 2019-02-12 NOTE — Patient Instructions (Signed)
Diabetes Mellitus and Foot Care Foot care is an important part of your health, especially when you have diabetes. Diabetes may cause you to have problems because of poor blood flow (circulation) to your feet and legs, which can cause your skin to:  Become thinner and drier.  Break more easily.  Heal more slowly.  Peel and crack. You may also have nerve damage (neuropathy) in your legs and feet, causing decreased feeling in them. This means that you may not notice minor injuries to your feet that could lead to more serious problems. Noticing and addressing any potential problems early is the best way to prevent future foot problems. How to care for your feet Foot hygiene  Wash your feet daily with warm water and mild soap. Do not use hot water. Then, pat your feet and the areas between your toes until they are completely dry. Do not soak your feet as this can dry your skin.  Trim your toenails straight across. Do not dig under them or around the cuticle. File the edges of your nails with an emery board or nail file.  Apply a moisturizing lotion or petroleum jelly to the skin on your feet and to dry, brittle toenails. Use lotion that does not contain alcohol and is unscented. Do not apply lotion between your toes. Shoes and socks  Wear clean socks or stockings every day. Make sure they are not too tight. Do not wear knee-high stockings since they may decrease blood flow to your legs.  Wear shoes that fit properly and have enough cushioning. Always look in your shoes before you put them on to be sure there are no objects inside.  To break in new shoes, wear them for just a few hours a day. This prevents injuries on your feet. Wounds, scrapes, corns, and calluses  Check your feet daily for blisters, cuts, bruises, sores, and redness. If you cannot see the bottom of your feet, use a mirror or ask someone for help.  Do not cut corns or calluses or try to remove them with medicine.  If you  find a minor scrape, cut, or break in the skin on your feet, keep it and the skin around it clean and dry. You may clean these areas with mild soap and water. Do not clean the area with peroxide, alcohol, or iodine.  If you have a wound, scrape, corn, or callus on your foot, look at it several times a day to make sure it is healing and not infected. Check for: ? Redness, swelling, or pain. ? Fluid or blood. ? Warmth. ? Pus or a bad smell. General instructions  Do not cross your legs. This may decrease blood flow to your feet.  Do not use heating pads or hot water bottles on your feet. They may burn your skin. If you have lost feeling in your feet or legs, you may not know this is happening until it is too late.  Protect your feet from hot and cold by wearing shoes, such as at the beach or on hot pavement.  Schedule a complete foot exam at least once a year (annually) or more often if you have foot problems. If you have foot problems, report any cuts, sores, or bruises to your health care provider immediately. Contact a health care provider if:  You have a medical condition that increases your risk of infection and you have any cuts, sores, or bruises on your feet.  You have an injury that is not   healing.  You have redness on your legs or feet.  You feel burning or tingling in your legs or feet.  You have pain or cramps in your legs and feet.  Your legs or feet are numb.  Your feet always feel cold.  You have pain around a toenail. Get help right away if:  You have a wound, scrape, corn, or callus on your foot and: ? You have pain, swelling, or redness that gets worse. ? You have fluid or blood coming from the wound, scrape, corn, or callus. ? Your wound, scrape, corn, or callus feels warm to the touch. ? You have pus or a bad smell coming from the wound, scrape, corn, or callus. ? You have a fever. ? You have a red line going up your leg. Summary  Check your feet every day  for cuts, sores, red spots, swelling, and blisters.  Moisturize feet and legs daily.  Wear shoes that fit properly and have enough cushioning.  If you have foot problems, report any cuts, sores, or bruises to your health care provider immediately.  Schedule a complete foot exam at least once a year (annually) or more often if you have foot problems. This information is not intended to replace advice given to you by your health care provider. Make sure you discuss any questions you have with your health care provider. Document Revised: 10/10/2018 Document Reviewed: 02/19/2016 Elsevier Patient Education  2020 [employer]  

## 2019-02-17 NOTE — Progress Notes (Signed)
Subjective: Mckenzie Tyler is a 49 y.o. y.o. female who presents today for preventative foot care with cc of painful, discolored, thick toenails plantar calluses which interfere with daily activities. Pain is aggravated when wearing enclosed shoe gear and relieved with periodic professional debridement.  [name], MD is her PCP.   Medications reviewed in chart.  Allergies  Allergen Reactions  . Codeine Itching    Objective: There were no vitals filed for this visit.  Vascular Examination: Capillary refill time immediate x 10 digits.  Dorsalis pedis and posterior tibial pulses present b/l.  Digital hair present x 10 digits.  Skin temperature gradient WNL b/l.  Dermatological Examination: Skin with normal turgor, texture and tone b/l.  Hyperkeratotic lesion submet head 1 b/l and b/l hallux with tenderness to palpation. No edema, no erythema, no drainage, no flocculence.  Toenails 1-5 b/l discolored, thick, dystrophic with subungual debris and pain with palpation to nailbeds due to thickness of nails.  Musculoskeletal: Muscle strength 5/5 to all LE muscle groups b/l.  No gross bony deformities b/l.  Neurological: Sensation intact 5/5 with 10 gram monofilament b/l.  Vibratory sensation intact b/l.  Assessment: 1. Painful onychomycosis toenails 1-5 b/l 2.  Callus submet head 1 b/l and b/l hallux  3.  NIDDM   Plan: 1. Continue diabetic foot care principles. Literature dispensed on today. 2. Toenails 1-5 b/l were debrided in length and girth without iatrogenic bleeding. 3. Calluses pared submet head 1 b/l and b/l hallux utilizing sterile scalpel blade without incident.  4. Patient to continue soft, supportive shoe gear daily. 5. Patient to report any pedal injuries to medical professional immediately. 6. Follow up 3 months.  7. Patient/POA to call should there be a concern in the interim.

## 2019-02-20 ENCOUNTER — Other Ambulatory Visit: Payer: Self-pay | Admitting: Family Medicine

## 2019-03-17 ENCOUNTER — Other Ambulatory Visit: Payer: Self-pay | Admitting: Family Medicine

## 2019-03-21 ENCOUNTER — Ambulatory Visit: Payer: Self-pay | Admitting: Family Medicine

## 2019-03-21 ENCOUNTER — Telehealth: Payer: Self-pay | Admitting: *Deleted

## 2019-03-21 NOTE — Telephone Encounter (Signed)
LMVM for pt to call back [phone] to reschedule appt due to icy weather.

## 2019-03-26 ENCOUNTER — Other Ambulatory Visit: Payer: Self-pay | Admitting: Family Medicine

## 2019-04-15 ENCOUNTER — Other Ambulatory Visit: Payer: Self-pay

## 2019-04-15 ENCOUNTER — Encounter: Payer: Self-pay | Admitting: Family Medicine

## 2019-04-15 ENCOUNTER — Ambulatory Visit (INDEPENDENT_AMBULATORY_CARE_PROVIDER_SITE_OTHER): Payer: BC Managed Care – PPO | Admitting: Family Medicine

## 2019-04-15 VITALS — BP 110/64 | HR 86 | Temp 97.3°F | Resp 18 | Ht 67.0 in | Wt 244.0 lb

## 2019-04-15 DIAGNOSIS — I10 Essential (primary) hypertension: Secondary | ICD-10-CM | POA: Diagnosis not present

## 2019-04-15 DIAGNOSIS — E118 Type 2 diabetes mellitus with unspecified complications: Secondary | ICD-10-CM | POA: Diagnosis not present

## 2019-04-15 DIAGNOSIS — F17200 Nicotine dependence, unspecified, uncomplicated: Secondary | ICD-10-CM | POA: Diagnosis not present

## 2019-04-15 DIAGNOSIS — E785 Hyperlipidemia, unspecified: Secondary | ICD-10-CM

## 2019-04-15 MED ORDER — VICTOZA 18 MG/3ML ~~LOC~~ SOPN: 0.6000 mg | PEN_INJECTOR | Freq: Every day | SUBCUTANEOUS | 11 refills | Status: DC

## 2019-04-15 NOTE — Progress Notes (Signed)
Subjective:    Patient ID: Mckenzie Tyler, female    DOB: 1970-08-22, 49 y.o.   MRN: [id number]  Medication Refill    12/20 Patient is a 49 year old Caucasian female here today for checkup.  She has type 2 diabetes mellitus but is insulin-dependent.  She is currently on Lantus.  She takes a varying dose every night based on her sugars.  She guesstimates that she averages 70 units at night.  Her last hemoglobin A1c was fairly well controlled at 7.1.  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  She also sees fluctuating blood sugars.  So while her average is well controlled, her blood pressures fluctuate quite a bit throughout the day.  We spent more than 20 minutes today discussing options including mealtime insulin with fast acting insulin as a correction rather than varying amounts of basal insulin which I think would work better or possibly adding a GLP-1 such as Victoza or Trulicity.  Patient is very interested in [place] with Trulicity.  She also is interested in smoking cessation.  She has had success in the past with Chantix and would like to try it again.  She denies any chest pain shortness of breath or dyspnea on exertion.  I performed her diabetic foot exam and while her pulses and her sensation are normal, the patient has pre-ulcerative calluses forming on the medial surface of her great toes bilaterally.  At that time, my plan was: I recommended setting a basal dose of Lantus such as 70 units.  I then recommended adding Trulicity and uptitrating 1.5 mg weekly to help control some of the fluctuations in her sugars throughout the day and afford some weight loss.  Patient is interested in this.  However first we will start her on Chantix for smoking cessation and once the nausea from Chantix subsides then we will do the Trulicity.  She is not on any renal protection.  I will check a urine microalbumin and if elevated will start her  on an angiotensin receptor blocker given her history of cough due to ACE inhibitor.  This would also help her blood pressure.  I will also check a fasting lipid panel.  Patient is already had her flu shot.  Refer her to a podiatrist for custom molded inserts due to the pre-ulcerative callus forming on the medial surfaces of both great toes.  04/19/18 Patient is a very pleasant 49 year old Caucasian female here today for follow-up.  Since our last visit, the patient quit smoking!.  I am very proud of her for doing this.  She was taking Chantix to help with her smoking cessation and therefore did not start Trulicity due to concern about nausea.  She has stopped the Chantix now and has been on Trulicity now for the last 2 to 3 weeks.  She is taking 0.75 mg weekly.  She also decreased her insulin dose as we discussed down to 70 units.  However her fasting blood sugars are running high between 160 and 200.  She has not experienced any hypoglycemia on the Trulicity.  She denies any polyuria polydipsia or blurry vision.  She denies any chest pain shortness of breath or dyspnea on exertion.  She does have a plantars wart on the plantar aspect of her right foot.  It is proximal to the third MTP joint.  It is a roughly 4 mm hyperkeratotic papule.  She has an appointment to see her podiatrist in April and I  have recommended having her podiatrist core the lesion out.  Otherwise she is doing well with no concerns.  At that time, my plan was: I am extremely proud of the patient for smoking cessation.  I congratulated her.  I would increase her insulin gradually back to 100 units daily.  I recommended that she do this in 3 to 4-day intervals over the next 1 to 2 weeks unless she sees her fasting blood sugars fall below 80.  She will increase her Lantus back to 100 units.  She will also increase her Trulicity to 1.5 mg weekly.  Hopefully we will see her 2-hour postprandial sugars fall below 160 and her fasting blood sugars fall  below 130 with these changes.  I will check a CBC, CMP, fasting lipid panel, and hemoglobin A1c.  I encouraged the patient to see her podiatrist to have the plantars wart treated.  07/20/18 Patient continues to refrain from smoking.  I am extremely proud of her for this.  Unfortunately her blood pressure is elevated at 148/78.  She is also a diabetic and not on an angiotensin receptor blocker.  She is previously tried an ACE inhibitor in the past and had a cough associated with it.  She is never tried an ARB.  She continues to take insulin 80 units a day.  Her fasting blood sugars are 90-100.  However her 2-hour postprandial sugars are greater than 200.  Furthermore she hates Trulicity.  It causes her to feel extremely nauseated and experienced diarrhea.  She is unable to tolerate the medication.  He also reports mental fog all day.  She states that she wakes up relatively easily however it is almost like her brain does not wake up.  She has a hard time focusing.  She has poor memory.  She feels fatigued.  Her sister has sleep apnea.  Her father has sleep apnea.  She denies any depression.  She has no previous history of ADD.  Has no memory loss.  At that time, my plan was:  Regarding the patient's diabetes, we will continue her basal insulin at 80 units as her fasting sugars are well controlled.  We will discontinue Trulicity and replace with Actos 30 mg a day.  Recheck 2-hour postprandial sugars in 6 weeks and if still elevated consider adding Jardiance.  Because of her elevated blood pressure we will add losartan 25 mg a day and recheck blood pressure in 2 weeks.  Check fasting lipid panel.  Goal LDL cholesterol is less than 100.  Because of her mental fatigue and poor focus, I would recommend performing an overnight pulse oximetry to see if she has hypoxia and if it present, check the patient for obstructive sleep apnea.  If not present consider ADD.  Treat onychomycosis with Lamisil 250 mg daily for up to  90 days.  Check liver test every 30 days to monitor for liver toxicity.  Explained this to the patient  10/15/18 HgA1c was 8.6 in June.  However patient states that her blood sugar is doing much better.  She states that her average blood sugar in the morning is between 110 and 120.  She only occasionally has a low blood sugar which is in the 70s.  She states that her 2-hour postprandial sugars are generally less than 160.  She is only twice seeing her blood sugar over 200 in the evening.  Overall she is doing well.  She has an appointment to see a sleep specialist to evaluate for  obstructive sleep apnea.  Recently her overnight pulse oximetry did reveal nocturnal hypoxia.  That coupled with her poor focus and mental fatigue has me concerned that she may have obstructive sleep apnea.  That work-up is pending.  Her blood pressure today is excellent at 112/80.  She denies any chest pain shortness of breath or dyspnea on exertion.  Patient states that she has not had a mammogram in more than 3 years.  She has not had a colonoscopy ever.  She is overdue for a Pap smear as well.  We discussed all of these today.  She would like me to schedule her for mammogram and a colonoscopy.  She defers the Pap smear for now.  At that time, my plan was: I am very happy with her reported blood sugars.  I anticipate that her hemoglobin A1c will be less than 7.  Patient also states that she has quit smoking and that has been smoke-free now for almost 8 months which I congratulated the patient for.  She has had her flu shot.  Her blood pressure today is well controlled at 112/80.  I will check a fasting lipid panel.  Given her diabetes, I would like her LDL cholesterol to be less than 100.  I will also schedule the patient for a mammogram.  I will schedule the patient for a colonoscopy.  Diabetic foot exam was performed today and is normal.  Based on labs, my recommendations last time were:  "Hemoglobin A1c is 8.2. Goal is less than  7. If her fasting blood sugars are less than 130, we need to focus on her 2-hour postprandial sugars. I would increase her quick acting insulin until her 2-hour postprandial sugars are less than 160. Have the patient check her 2-hour postprandial sugars and report those values to [place] on Friday so that we can make an adjustment in her quick acting insulin"  01/17/19 Patient states that her fasting blood sugars are between 120 and 140.  She is currently taking Lantus 60 units a day.  She is using Humalog at mealtime.  Her postprandial sugars are highly variable and are averaging around 210.  She stopped increasing her NovoLog when she reached 12 units due to concern about hypoglycemia even though her postprandial sugars were still greater than 200.  She is occasionally seeing 300 depending upon if she eats pizza or other carb rich foods.  She denies any hypoglycemic episodes.  She denies any neuropathy in her feet.  She denies any chest pain shortness of breath or dyspnea on exertion.  She is being fitted for her CPAP today.  Her blood pressure today is well controlled at 140/60.  At that time, my plan was: I spent 20 minutes today with the patient going over treatment options for diabetes including adding Trulicity or Victoza or Ozempic.  Patient did not tolerate these medication due to nausea.  Therefore she prefers not to add them.  After further discussion, I realize now the patient is taking her Humalog after meals.  I explained to her that the medication works better if you take it prior to meals ideally 30 minutes so that it can better counter the rise in blood sugar.  She will try doing that.  I would then also increase her Humalog until 2-hour postprandial sugars are under 160.  I have recommended increasing her Humalog up to 18 units if her 2-hour postprandial sugars are still greater than 200 despite taking Humalog before meals.  She will give  me an email update in 1 week with her progress.  Patient  is checking her blood sugar 4 times a day (prior to meals) and at bedtime.   04/15/19 Wt Readings from Last 3 Encounters:  04/15/19 244 lb (110.7 kg)  01/17/19 241 lb (109.3 kg)  11/15/18 226 lb (102.5 kg)   Am 140-160's Noon <200 Supper 160-170 Patient's blood sugars are reported slightly elevated above.  However she is consistently keeping her blood sugar between 140 and 200.  The majority are between 140 and 170.  She denies any hypoglycemic episodes.  Unfortunately she is having a difficult time losing weight and she appears to be very frustrated by this.  She experienced nausea with Trulicity before however as I explained to the patient the Actos and her insulin is making it very difficult for her to lose weight.  She denies any chest pain.  She denies any shortness of breath.  She denies any dyspnea on exertion.  She denies any neuropathy. Past Medical History:  Diagnosis Date  . Allergy   . Depression   . Diabetes mellitus type 2 with complications ([place])   . Hyperlipidemia 02/13/2013  . Smoker    Past Surgical History:  Procedure Laterality Date  . RETINOPATHY SURGERY  11/16/2017   due to diabetes   . TONSILLECTOMY AND ADENOIDECTOMY     age 48   Current Outpatient Medications on File Prior to Visit  Medication Sig Dispense Refill  . calcium carbonate (OS-CAL) 600 MG tablet Take 600 mg by mouth daily.    Marland Kitchen HUMALOG KWIKPEN 100 UNIT/ML KwikPen INJECT 0.15 TO 0.2MLS (15-20 UNITS TOTAL) INTO THE SKIN 3 TIMES DAILY 15 mL 0  . insulin aspart (NOVOLOG FLEXPEN) 100 UNIT/ML FlexPen Inject 7 Units into the skin 3 (three) times daily with meals. 15 mL 1  . insulin glargine (LANTUS) 100 UNIT/ML injection Inject 1 mL (100 Units total) into the skin at bedtime. (Patient taking differently: Inject 60 Units into the skin at bedtime. ) 3 vial 11  . Insulin Pen Needle (PEN NEEDLES) 32G X 5 MM MISC 1 Units by Does not apply route 3 (three) times daily. 100 each 2  . Insulin Syringe-Needle U-100 25G  X 1" 1 ML MISC Use as directed 100 each 3  . losartan (COZAAR) 25 MG tablet Take 1 tablet (25 mg total) by mouth daily. 90 tablet 3  . Magnesium 400 MG TABS Take by mouth.    . metFORMIN (GLUCOPHAGE) 1000 MG tablet TAKE 1 TABLET BY MOUTH TWICE DAILY WITH A MEAL 180 tablet 1  . Multiple Vitamin (MULTIVITAMIN) tablet Take 1 tablet by mouth daily.    Marland Kitchen OVER THE COUNTER MEDICATION daily. Selinium 200 mg herbal supplement daily    . pioglitazone (ACTOS) 30 MG tablet Take 1 tablet (30 mg total) by mouth daily. 90 tablet 3  . POTASSIUM PO Take by mouth.    . simvastatin (ZOCOR) 20 MG tablet TAKE 1 TABLET BY MOUTH ONCE DAILY AT  6PM 90 tablet 1   No current facility-administered medications on file prior to visit.   Allergies  Allergen Reactions  . Codeine Itching   Social History   Socioeconomic History  . Marital status: Single    Spouse name: Not on file  . Number of children: Not on file  . Years of education: Not on file  . Highest education level: Not on file  Occupational History  . Not on file  Tobacco Use  . Smoking status: Former  Smoker    Packs/day: 0.25    Types: Cigarettes    Quit date: 01/21/2018    Years since quitting: 1.2  . Smokeless tobacco: Never Used  . Tobacco comment: has been trying to quit  Substance and Sexual Activity  . Alcohol use: No    Alcohol/week: 0.0 standard drinks  . Drug use: No  . Sexual activity: Yes    Birth control/protection: None  Other Topics Concern  . Not on file  Social History Narrative  . Not on file   Social Determinants of Health   Financial Resource Strain:   . Difficulty of Paying Living Expenses:   Food Insecurity:   . Worried About Charity fundraiser in the Last Year:   . Arboriculturist in the Last Year:   Transportation Needs:   . Film/video editor (Medical):   Marland Kitchen Lack of Transportation (Non-Medical):   Physical Activity:   . Days of Exercise per Week:   . Minutes of Exercise per Session:   Stress:   .  Feeling of Stress :   Social Connections:   . Frequency of Communication with Friends and Family:   . Frequency of Social Gatherings with Friends and Family:   . Attends Religious Services:   . Active Member of Clubs or Organizations:   . Attends Archivist Meetings:   Marland Kitchen Marital Status:   Intimate Partner Violence:   . Fear of Current or Ex-Partner:   . Emotionally Abused:   Marland Kitchen Physically Abused:   . Sexually Abused:       Review of Systems  All other systems reviewed and are negative.      Objective:   Physical Exam Vitals reviewed.  Constitutional:      General: She is not in acute distress.    Appearance: She is not ill-appearing.  Cardiovascular:     Rate and Rhythm: Normal rate and regular rhythm.     Pulses: Normal pulses.     Heart sounds: Normal heart sounds. No murmur. No gallop.   Pulmonary:     Effort: Pulmonary effort is normal. No respiratory distress.     Breath sounds: Normal breath sounds. No stridor. No wheezing, rhonchi or rales.  Chest:     Chest wall: No tenderness.  Abdominal:     General: Bowel sounds are normal. There is no distension.     Tenderness: There is no guarding or rebound.     Hernia: No hernia is present.  Musculoskeletal:     Right lower leg: No edema.     Left lower leg: No edema.  Neurological:     Mental Status: She is alert.        Assessment & Plan:  Benign essential HTN  Hyperlipidemia, unspecified hyperlipidemia type  Smoker  Type 2 diabetes mellitus with complication (HCC) - Plan: Hemoglobin A1c, CBC with Differential/Platelet, COMPLETE METABOLIC PANEL WITH GFR, Lipid panel, Microalbumin, urine  Blood pressure today is outstanding.  I will check a CBC, CMP, fasting lipid panel, urine microalbumin, and A1c.  Ideally I like her A1c to be below 7.  Her reported blood sugars suggest an A1c around 7-7.5.  I have suggested that we add Victoza and reduce her rapid acting insulin by 50% to try to help her lose  weight.  I will start her at 0.6 mg of Victoza subcu daily and then increase every 1 to 2 weeks as tolerated to 1.8 mg daily.  Hopefully if she can tolerate  that we can next switch her Actos to Jardiance to get her on a better regimen to stack the deck in her favor for weight loss.  I will also check a fasting lipid panel as I would like to see her LDL cholesterol less than 100.  Continue to encourage smoking cessation

## 2019-04-16 LAB — CBC WITH DIFFERENTIAL/PLATELET
Absolute Monocytes: 572 cells/uL (ref 200–950)
Basophils Absolute: 52 cells/uL (ref 0–200)
Basophils Relative: 1 %
Eosinophils Absolute: 312 cells/uL (ref 15–500)
Eosinophils Relative: 6 %
HCT: 41.1 % (ref 35.0–45.0)
Hemoglobin: 13.4 g/dL (ref 11.7–15.5)
Lymphs Abs: 1144 cells/uL (ref 850–3900)
MCH: 29.6 pg (ref 27.0–33.0)
MCHC: 32.6 g/dL (ref 32.0–36.0)
MCV: 90.7 fL (ref 80.0–100.0)
MPV: 10.5 fL (ref 7.5–12.5)
Monocytes Relative: 11 %
Neutro Abs: 3120 cells/uL (ref 1500–7800)
Neutrophils Relative %: 60 %
Platelets: 199 10*3/uL (ref 140–400)
RBC: 4.53 10*6/uL (ref 3.80–5.10)
RDW: 13.1 % (ref 11.0–15.0)
Total Lymphocyte: 22 %
WBC: 5.2 10*3/uL (ref 3.8–10.8)

## 2019-04-16 LAB — COMPLETE METABOLIC PANEL WITH GFR
AG Ratio: 1.9 (calc) (ref 1.0–2.5)
ALT: 28 U/L (ref 6–29)
AST: 24 U/L (ref 10–35)
Albumin: 4.3 g/dL (ref 3.6–5.1)
Alkaline phosphatase (APISO): 35 U/L — ABNORMAL LOW (ref 37–153)
BUN: 20 mg/dL (ref 7–25)
CO2: 28 mmol/L (ref 20–32)
Calcium: 9.2 mg/dL (ref 8.6–10.4)
Chloride: 104 mmol/L (ref 98–110)
Creat: 0.73 mg/dL (ref 0.50–1.05)
GFR, Est African American: 111 mL/min/{1.73_m2} (ref >=60)
GFR, Est Non African American: 96 mL/min/{1.73_m2} (ref >=60)
Globulin: 2.3 g/dL (calc) (ref 1.9–3.7)
Glucose, Bld: 170 mg/dL — ABNORMAL HIGH (ref 65–99)
Potassium: 4.6 mmol/L (ref 3.5–5.3)
Sodium: 139 mmol/L (ref 135–146)
Total Bilirubin: 0.4 mg/dL (ref 0.2–1.2)
Total Protein: 6.6 g/dL (ref 6.1–8.1)

## 2019-04-16 LAB — LIPID PANEL
Cholesterol: 177 mg/dL (ref <200)
HDL: 36 mg/dL — ABNORMAL LOW (ref >=50)
LDL Cholesterol (Calc): 100 mg/dL (calc) — ABNORMAL HIGH
Non-HDL Cholesterol (Calc): 141 mg/dL (calc) — ABNORMAL HIGH (ref <130)
Total CHOL/HDL Ratio: 4.9 (calc) (ref <5.0)
Triglycerides: 305 mg/dL — ABNORMAL HIGH (ref <150)

## 2019-04-16 LAB — MICROALBUMIN, URINE: Microalb, Ur: 0.6 mg/dL

## 2019-04-16 LAB — HEMOGLOBIN A1C
Hgb A1c MFr Bld: 7.1 % of total Hgb — ABNORMAL HIGH (ref <5.7)
Mean Plasma Glucose: 157 (calc)
eAG (mmol/L): 8.7 (calc)

## 2019-04-19 ENCOUNTER — Ambulatory Visit: Payer: BC Managed Care – PPO | Admitting: Family Medicine

## 2019-04-21 ENCOUNTER — Encounter: Payer: Self-pay | Admitting: Family Medicine

## 2019-04-30 ENCOUNTER — Other Ambulatory Visit: Payer: Self-pay | Admitting: Family Medicine

## 2019-05-10 ENCOUNTER — Encounter: Payer: Self-pay | Admitting: Family Medicine

## 2019-05-20 ENCOUNTER — Encounter: Payer: Self-pay | Admitting: Family Medicine

## 2019-05-20 ENCOUNTER — Ambulatory Visit (INDEPENDENT_AMBULATORY_CARE_PROVIDER_SITE_OTHER): Payer: BC Managed Care – PPO | Admitting: Family Medicine

## 2019-05-20 ENCOUNTER — Other Ambulatory Visit: Payer: Self-pay

## 2019-05-20 ENCOUNTER — Encounter: Payer: Self-pay | Admitting: Podiatry

## 2019-05-20 ENCOUNTER — Ambulatory Visit (INDEPENDENT_AMBULATORY_CARE_PROVIDER_SITE_OTHER): Payer: BC Managed Care – PPO | Admitting: Podiatry

## 2019-05-20 VITALS — BP 124/84 | HR 95 | Temp 97.2°F | Ht 67.0 in | Wt 243.0 lb

## 2019-05-20 DIAGNOSIS — R5383 Other fatigue: Secondary | ICD-10-CM

## 2019-05-20 DIAGNOSIS — E119 Type 2 diabetes mellitus without complications: Secondary | ICD-10-CM

## 2019-05-20 DIAGNOSIS — L84 Corns and callosities: Secondary | ICD-10-CM | POA: Diagnosis not present

## 2019-05-20 DIAGNOSIS — G4733 Obstructive sleep apnea (adult) (pediatric): Secondary | ICD-10-CM

## 2019-05-20 DIAGNOSIS — Z9989 Dependence on other enabling machines and devices: Secondary | ICD-10-CM | POA: Diagnosis not present

## 2019-05-20 NOTE — Patient Instructions (Signed)
Please continue using your CPAP regularly. While your insurance requires that you use CPAP at least 4 hours each night on 70% of the nights, I recommend, that you not skip any nights and use it throughout the night if you can. Getting used to CPAP and staying with the treatment long term does take time and patience and discipline. Untreated obstructive sleep apnea when it is moderate to severe can have an adverse impact on cardiovascular health and raise her risk for heart disease, arrhythmias, hypertension, congestive heart failure, stroke and diabetes. Untreated obstructive sleep apnea causes sleep disruption, nonrestorative sleep, and sleep deprivation. This can have an impact on your day to day functioning and cause daytime sleepiness and impairment of cognitive function, memory loss, mood disturbance, and problems focussing. Using CPAP regularly can improve these symptoms.   Follow up in 1 year, sooner if needed   Sleep Apnea Sleep apnea affects breathing during sleep. It causes breathing to stop for a short time or to become shallow. It can also increase the risk of:  Heart attack.  Stroke.  Being very overweight (obese).  Diabetes.  Heart failure.  Irregular heartbeat. The goal of treatment is to help you breathe normally again. What are the causes? There are three kinds of sleep apnea:  Obstructive sleep apnea. This is caused by a blocked or collapsed airway.  Central sleep apnea. This happens when the brain does not send the right signals to the muscles that control breathing.  Mixed sleep apnea. This is a combination of obstructive and central sleep apnea. The most common cause of this condition is a collapsed or blocked airway. This can happen if:  Your throat muscles are too relaxed.  Your tongue and tonsils are too large.  You are overweight.  Your airway is too small. What increases the risk?  Being overweight.  Smoking.  Having a small airway.  Being  older.  Being female.  Drinking alcohol.  Taking medicines to calm yourself (sedatives or tranquilizers).  Having family members with the condition. What are the signs or symptoms?  Trouble staying asleep.  Being sleepy or tired during the day.  Getting angry a lot.  Loud snoring.  Headaches in the morning.  Not being able to focus your mind (concentrate).  Forgetting things.  Less interest in sex.  Mood swings.  Personality changes.  Feelings of sadness (depression).  Waking up a lot during the night to pee (urinate).  Dry mouth.  Sore throat. How is this diagnosed?  Your medical history.  A physical exam.  A test that is done when you are sleeping (sleep study). The test is most often done in a sleep lab but may also be done at home. How is this treated?   Sleeping on your side.  Using a medicine to get rid of mucus in your nose (decongestant).  Avoiding the use of alcohol, medicines to help you relax, or certain pain medicines (narcotics).  Losing weight, if needed.  Changing your diet.  Not smoking.  Using a machine to open your airway while you sleep, such as: ? An oral appliance. This is a mouthpiece that shifts your lower jaw forward. ? A CPAP device. This device blows air through a mask when you breathe out (exhale). ? An EPAP device. This has valves that you put in each nostril. ? A BPAP device. This device blows air through a mask when you breathe in (inhale) and breathe out.  Having surgery if other treatments do not   work. It is important to get treatment for sleep apnea. Without treatment, it can lead to:  High blood pressure.  Coronary artery disease.  In men, not being able to have an erection (impotence).  Reduced thinking ability. Follow these instructions at home: Lifestyle  Make changes that your doctor recommends.  Eat a healthy diet.  Lose weight if needed.  Avoid alcohol, medicines to help you relax, and some pain  medicines.  Do not use any products that contain nicotine or tobacco, such as cigarettes, e-cigarettes, and chewing tobacco. If you need help quitting, ask your doctor. General instructions  Take over-the-counter and prescription medicines only as told by your doctor.  If you were given a machine to use while you sleep, use it only as told by your doctor.  If you are having surgery, make sure to tell your doctor you have sleep apnea. You may need to bring your device with you.  Keep all follow-up visits as told by your doctor. This is important. Contact a doctor if:  The machine that you were given to use during sleep bothers you or does not seem to be working.  You do not get better.  You get worse. Get help right away if:  Your chest hurts.  You have trouble breathing in enough air.  You have an uncomfortable feeling in your back, arms, or stomach.  You have trouble talking.  One side of your body feels weak.  A part of your face is hanging down. These symptoms may be an emergency. Do not wait to see if the symptoms will go away. Get medical help right away. Call your local emergency services (911 in the [place] Do not drive yourself to the hospital. Summary  This condition affects breathing during sleep.  The most common cause is a collapsed or blocked airway.  The goal of treatment is to help you breathe normally while you sleep. This information is not intended to replace advice given to you by your health care provider. Make sure you discuss any questions you have with your health care provider. Document Revised: 11/03/2017 Document Reviewed: 09/12/2017 Elsevier Patient Education  2020 [employer]  

## 2019-05-20 NOTE — Patient Instructions (Addendum)
Corns and Calluses Corns are small areas of thickened skin that occur on the top, sides, or tip of a toe. They contain a cone-shaped core with a point that can press on a nerve below. This causes pain.  Calluses are areas of thickened skin that can occur anywhere on the body, including the hands, fingers, palms, soles of the feet, and heels. Calluses are usually larger than corns. What are the causes? Corns and calluses are caused by rubbing (friction) or pressure, such as from shoes that are too tight or do not fit properly. What increases the risk? Corns are more likely to develop in people who have misshapen toes (toe deformities), such as hammer toes. Calluses can occur with friction to any area of the skin. They are more likely to develop in people who:  Work with their hands.  Wear shoes that fit poorly, are too tight, or are high-heeled.  Have toe deformities. What are the signs or symptoms? Symptoms of a corn or callus include:  A hard growth on the skin.  Pain or tenderness under the skin.  Redness and swelling.  Increased discomfort while wearing tight-fitting shoes, if your feet are affected. If a corn or callus becomes infected, symptoms may include:  Redness and swelling that gets worse.  Pain.  Fluid, blood, or pus draining from the corn or callus. How is this diagnosed? Corns and calluses may be diagnosed based on your symptoms, your medical history, and a physical exam. How is this treated? Treatment for corns and calluses may include:  Removing the cause of the friction or pressure. This may involve: ? Changing your shoes. ? Wearing shoe inserts (orthotics) or other protective layers in your shoes, such as a corn pad. ? Wearing gloves.  Applying medicine to the skin (topical medicine) to help soften skin in the hardened, thickened areas.  Removing layers of dead skin with a file to reduce the size of the corn or callus.  Removing the corn or callus with a  scalpel or laser.  Taking antibiotic medicines, if your corn or callus is infected.  Having surgery, if a toe deformity is the cause. Follow these instructions at home:   Take over-the-counter and prescription medicines only as told by your health care provider.  If you were prescribed an antibiotic, take it as told by your health care provider. Do not stop taking it even if your condition starts to improve.  Wear shoes that fit well. Avoid wearing high-heeled shoes and shoes that are too tight or too loose.  Wear any padding, protective layers, gloves, or orthotics as told by your health care provider.  Soak your hands or feet and then use a file or pumice stone to soften your corn or callus. Do this as told by your health care provider.  Check your corn or callus every day for symptoms of infection. Contact a health care provider if you:  Notice that your symptoms do not improve with treatment.  Have redness or swelling that gets worse.  Notice that your corn or callus becomes painful.  Have fluid, blood, or pus coming from your corn or callus.  Have new symptoms. Summary  Corns are small areas of thickened skin that occur on the top, sides, or tip of a toe.  Calluses are areas of thickened skin that can occur anywhere on the body, including the hands, fingers, palms, and soles of the feet. Calluses are usually larger than corns.  Corns and calluses are caused by   rubbing (friction) or pressure, such as from shoes that are too tight or do not fit properly.  Treatment may include wearing any padding, protective layers, gloves, or orthotics as told by your health care provider. This information is not intended to replace advice given to you by your health care provider. Make sure you discuss any questions you have with your health care provider. Document Revised: 05/09/2018 Document Reviewed: 11/30/2016 Elsevier Patient Education  [place].  Diabetes Mellitus and  [place] care is an important part of your health, especially when you have diabetes. Diabetes may cause you to have problems because of poor blood flow (circulation) to your feet and legs, which can cause your skin to:  Become thinner and drier.  Break more easily.  Heal more slowly.  Peel and crack. You may also have nerve damage (neuropathy) in your legs and feet, causing decreased feeling in them. This means that you may not notice minor injuries to your feet that could lead to more serious problems. Noticing and addressing any potential problems early is the best way to prevent future foot problems. How to care for your feet Foot hygiene  Wash your feet daily with warm water and mild soap. Do not use hot water. Then, pat your feet and the areas between your toes until they are completely dry. Do not soak your feet as this can dry your skin.  Trim your toenails straight across. Do not dig under them or around the cuticle. File the edges of your nails with an emery board or nail file.  Apply a moisturizing lotion or petroleum jelly to the skin on your feet and to dry, brittle toenails. Use lotion that does not contain alcohol and is unscented. Do not apply lotion between your toes. Shoes and socks  Wear clean socks or stockings every day. Make sure they are not too tight. Do not wear knee-high stockings since they may decrease blood flow to your legs.  Wear shoes that fit properly and have enough cushioning. Always look in your shoes before you put them on to be sure there are no objects inside.  To break in new shoes, wear them for just a few hours a day. This prevents injuries on your feet. Wounds, scrapes, corns, and calluses  Check your feet daily for blisters, cuts, bruises, sores, and redness. If you cannot see the bottom of your feet, use a mirror or ask someone for help.  Do not cut corns or calluses or try to remove them with medicine.  If you find a minor scrape,  cut, or break in the skin on your feet, keep it and the skin around it clean and dry. You may clean these areas with mild soap and water. Do not clean the area with peroxide, alcohol, or iodine.  If you have a wound, scrape, corn, or callus on your foot, look at it several times a day to make sure it is healing and not infected. Check for: ? Redness, swelling, or pain. ? Fluid or blood. ? Warmth. ? Pus or a bad smell. General instructions  Do not cross your legs. This may decrease blood flow to your feet.  Do not use heating pads or hot water bottles on your feet. They may burn your skin. If you have lost feeling in your feet or legs, you may not know this is happening until it is too late.  Protect your feet from hot and cold by wearing shoes, such as at  the beach or on hot pavement.  Schedule a complete foot exam at least once a year (annually) or more often if you have foot problems. If you have foot problems, report any cuts, sores, or bruises to your health care provider immediately. Contact a health care provider if:  You have a medical condition that increases your risk of infection and you have any cuts, sores, or bruises on your feet.  You have an injury that is not healing.  You have redness on your legs or feet.  You feel burning or tingling in your legs or feet.  You have pain or cramps in your legs and feet.  Your legs or feet are numb.  Your feet always feel cold.  You have pain around a toenail. Get help right away if:  You have a wound, scrape, corn, or callus on your foot and: ? You have pain, swelling, or redness that gets worse. ? You have fluid or blood coming from the wound, scrape, corn, or callus. ? Your wound, scrape, corn, or callus feels warm to the touch. ? You have pus or a bad smell coming from the wound, scrape, corn, or callus. ? You have a fever. ? You have a red line going up your leg. Summary  Check your feet every day for cuts, sores, red  spots, swelling, and blisters.  Moisturize feet and legs daily.  Wear shoes that fit properly and have enough cushioning.  If you have foot problems, report any cuts, sores, or bruises to your health care provider immediately.  Schedule a complete foot exam at least once a year (annually) or more often if you have foot problems. This information is not intended to replace advice given to you by your health care provider. Make sure you discuss any questions you have with your health care provider. Document Revised: 10/10/2018 Document Reviewed: 02/19/2016 Elsevier Patient Education  [place].

## 2019-05-20 NOTE — Progress Notes (Addendum)
PATIENT: Mckenzie Tyler DOB: 08-Jan-1971  REASON FOR VISIT: follow up HISTORY FROM: patient  Chief Complaint  Patient presents with  . Follow-up    CPAP fu, Back rm, alone, pt states she is doing well     HISTORY OF PRESENT ILLNESS: Today 05/20/19 Mckenzie Tyler is a 49 y.o. female here today for follow up of OSA on CPAP. Sleep study in 10/2018 showed "moderate to severe obstructive sleep apnea, with a total AHI of 16.8/hour, REM AHI of 38.1/hour, supine AHI of 23.2/hour and O2 nadir of 77%". Titration study performed and BiPAP attempted, however, patient could not tolerate BiPAP. CPAP was started at set pressure of 15cmH20. She has done well with CPAP therapy at home. She did note increased energy and improved sleep quality after starting therpy. She does continue to have excessive fatigue. She is working on lifestyle changes and managing DM closely with PCP.  Compliance report dated 04/16/2019 through 05/15/2019 reveals that she has used CPAP 30 of the past 30 days for compliance of 100%.  She has used CPAP greater than 4 hours 30 of the past 30 days for compliance of 100%.  Average usage was 7 hours and 14 minutes.  Residual AHI was 3.4 on set pressure of 15 cm of water and an EPR of 2.  There was no significant leak noted.   HISTORY: (copied from Dr Guadelupe Sabin note on 10/18/2018)  Dear Dr. Dennard Schaumann,  I saw your patient, Mckenzie Tyler, upon your kind request to my sleep clinic today for initial consultation of her sleep disorder, in particular, concern for underlying obstructive sleep apnea.  The patient is unaccompanied today.  As you know, Ms. Haslip is a 49 year old right-handed woman with an underlying medical history of diabetes, prior smoking, depression, hyperlipidemia and obesity, who reports snoring and excessive daytime somnolence, as well as recent difficulty with focus, reportedly she also had a recent abnormal overnight pulse oximetry test.  I reviewed your office note from  10/15/2018.  Her pulse oximetry was done in mid August, she did have desaturations into the 80s.  We will request test results from [facility] as I could not pull up the results in epic. Her Epworth sleepiness score is 16 out of 24, fatigue severity score is 42 out of 63.  She has been noted to make puffing sounds while asleep per sister.  Her younger sister has sleep apnea and uses a CPAP machine.  The patient is single and lives alone, no kids.  She has 1 dog who sleeps in the bedroom with her.  She has no TV in the bedroom.  She drinks caffeine and limitation, soda every other day, occasional tea, typically no coffee, tries to be in bed around 10 and rise time is 5 AM.  She has a 45-minute commute to [place].  She works from home some.  She works as a Ambulance person.  She is a non-smoker and quit almost a year ago after smoking for about 25 years.  She denies morning headaches but has nocturia about twice per average night.  She had a tonsillectomy as a child.  She has had some weight fluctuations, some weight gain in the realm of 10 pounds in the past 6 months or so.   REVIEW OF SYSTEMS: Out of a complete 14 system review of symptoms, the patient complains only of the following symptoms, fatigue and all other reviewed systems are negative.  ESS: 17 FSS: 49  ALLERGIES: Allergies  Allergen Reactions  .  Codeine Itching    HOME MEDICATIONS: Outpatient Medications Prior to Visit  Medication Sig Dispense Refill  . calcium carbonate (OS-CAL) 600 MG tablet Take 600 mg by mouth daily.    Marland Kitchen HUMALOG KWIKPEN 100 UNIT/ML KwikPen INJECT 15 TO 20 UNITS INTO THE SKIN 3 TIMES DAILY 15 mL 3  . insulin glargine (LANTUS) 100 UNIT/ML injection Inject 1 mL (100 Units total) into the skin at bedtime. (Patient taking differently: Inject 60 Units into the skin at bedtime. ) 3 vial 11  . Insulin Pen Needle (PEN NEEDLES) 32G X 5 MM MISC 1 Units by Does not apply route 3 (three) times daily. 100 each 2   . Insulin Syringe-Needle U-100 25G X 1" 1 ML MISC Use as directed 100 each 3  . liraglutide (VICTOZA) 18 MG/3ML SOPN Inject 0.1 mLs (0.6 mg total) into the skin daily. 1 pen 11  . losartan (COZAAR) 25 MG tablet Take 1 tablet (25 mg total) by mouth daily. 90 tablet 3  . Magnesium 400 MG TABS Take by mouth.    . metFORMIN (GLUCOPHAGE) 1000 MG tablet TAKE 1 TABLET BY MOUTH TWICE DAILY WITH A MEAL 180 tablet 1  . Multiple Vitamin (MULTIVITAMIN) tablet Take 1 tablet by mouth daily.    Marland Kitchen OVER THE COUNTER MEDICATION daily. Selinium 200 mg herbal supplement daily    . pioglitazone (ACTOS) 30 MG tablet Take 1 tablet (30 mg total) by mouth daily. 90 tablet 3  . POTASSIUM PO Take by mouth.    . simvastatin (ZOCOR) 20 MG tablet TAKE 1 TABLET BY MOUTH ONCE DAILY AT  6PM 90 tablet 1   No facility-administered medications prior to visit.    PAST MEDICAL HISTORY: Past Medical History:  Diagnosis Date  . Allergy   . Depression   . Diabetes mellitus type 2 with complications ([place])   . Hyperlipidemia 02/13/2013  . Smoker     PAST SURGICAL HISTORY: Past Surgical History:  Procedure Laterality Date  . RETINOPATHY SURGERY  11/16/2017   due to diabetes   . TONSILLECTOMY AND ADENOIDECTOMY     age 59    FAMILY HISTORY: Family History  Problem Relation Age of Onset  . Diabetes Father   . Cancer Neg Hx   . Heart disease Neg Hx   . Colon cancer Neg Hx   . Colon polyps Neg Hx   . Rectal cancer Neg Hx   . Stomach cancer Neg Hx     SOCIAL HISTORY: Social History   Socioeconomic History  . Marital status: Single    Spouse name: Not on file  . Number of children: Not on file  . Years of education: Not on file  . Highest education level: Not on file  Occupational History  . Not on file  Tobacco Use  . Smoking status: Former Smoker    Packs/day: 0.25    Types: Cigarettes    Quit date: 01/21/2018    Years since quitting: 1.3  . Smokeless tobacco: Never Used  . Tobacco comment: has been  trying to quit  Substance and Sexual Activity  . Alcohol use: No    Alcohol/week: 0.0 standard drinks  . Drug use: No  . Sexual activity: Yes    Birth control/protection: None  Other Topics Concern  . Not on file  Social History Narrative  . Not on file   Social Determinants of Health   Financial Resource Strain:   . Difficulty of Paying Living Expenses:   Food Insecurity:   .  Worried About Charity fundraiser in the Last Year:   . Arboriculturist in the Last Year:   Transportation Needs:   . Film/video editor (Medical):   Marland Kitchen Lack of Transportation (Non-Medical):   Physical Activity:   . Days of Exercise per Week:   . Minutes of Exercise per Session:   Stress:   . Feeling of Stress :   Social Connections:   . Frequency of Communication with Friends and Family:   . Frequency of Social Gatherings with Friends and Family:   . Attends Religious Services:   . Active Member of Clubs or Organizations:   . Attends Archivist Meetings:   Marland Kitchen Marital Status:   Intimate Partner Violence:   . Fear of Current or Ex-Partner:   . Emotionally Abused:   Marland Kitchen Physically Abused:   . Sexually Abused:       PHYSICAL EXAM  Vitals:   05/20/19 0858  BP: 124/84  Pulse: 95  Temp: (!) 97.2 F (36.2 C)  Weight: 243 lb (110.2 kg)  Height: 5\' 7"  (1.702 m)   Body mass index is 38.06 kg/m.  Generalized: Well developed, in no acute distress  Cardiology: normal rate and rhythm, no murmur noted Respiratory: clear to auscultation bilaterally  Neurological examination  Mentation: Alert oriented to time, place, history taking. Follows all commands speech and language fluent Cranial nerve II-XII: Pupils were equal round reactive to light. Extraocular movements were full, visual field were full  Motor: The motor testing reveals 5 over 5 strength of all 4 extremities. Good symmetric motor tone is noted throughout.   Gait and station: Gait is normal.   DIAGNOSTIC DATA (LABS, IMAGING,  TESTING) - I reviewed patient records, labs, notes, testing and imaging myself where available.  No flowsheet data found.   Lab Results  Component Value Date   WBC 5.2 04/15/2019   HGB 13.4 04/15/2019   HCT 41.1 04/15/2019   MCV 90.7 04/15/2019   PLT 199 04/15/2019      Component Value Date/Time   NA 139 04/15/2019 0842   K 4.6 04/15/2019 0842   CL 104 04/15/2019 0842   CO2 28 04/15/2019 0842   GLUCOSE 170 (H) 04/15/2019 0842   BUN 20 04/15/2019 0842   CREATININE 0.73 04/15/2019 0842   CALCIUM 9.2 04/15/2019 0842   PROT 6.6 04/15/2019 0842   ALBUMIN 3.9 01/21/2016 1207   AST 24 04/15/2019 0842   ALT 28 04/15/2019 0842   ALKPHOS 37 01/21/2016 1207   BILITOT 0.4 04/15/2019 0842   GFRNONAA 96 04/15/2019 0842   GFRAA 111 04/15/2019 0842   Lab Results  Component Value Date   CHOL 177 04/15/2019   HDL 36 (L) 04/15/2019   LDLCALC 100 (H) 04/15/2019   TRIG 305 (H) 04/15/2019   CHOLHDL 4.9 04/15/2019   Lab Results  Component Value Date   HGBA1C 7.1 (H) 04/15/2019   No results found for: VITAMINB12 No results found for: TSH     ASSESSMENT AND PLAN 49 y.o. year old female  has a past medical history of Allergy, Depression, Diabetes mellitus type 2 with complications ([place]), Hyperlipidemia (02/13/2013), and Smoker. here with     ICD-10-CM   1. OSA on CPAP  G47.33    Z99.89   2. Fatigue, unspecified type  R53.83     Chaundra is doing very well with CPAP therapy.  Compliance report reveals excellent compliance.  She was encouraged to continue using CPAP nightly and for greater  than 4 hours each night.  We have discussed her concerns of continued fatigue.  She does feel that this is multifactorial.  She continues close follow-up with primary care for management of diabetes.  She is working on healthy lifestyle habits and plans to resume her gym membership.  Healthy lifestyle habits were encouraged.  She will follow-up closely with primary care for comorbidity management.  She  will follow-up with [place] in 1 year, sooner if needed for CPAP management.  She verbalizes understanding and agreement with this plan.   No orders of the defined types were placed in this encounter.    No orders of the defined types were placed in this encounter.     I spent 15 minutes with the patient. 50% of this time was spent counseling and educating patient on plan of care and medications.    Debbora Presto, FNP-C 05/20/2019, 9:52 AM [facility] [address] [place], [place] [address] [phone]  I reviewed the above note and documentation by the Nurse Practitioner and agree with the history, exam, assessment and plan as outlined above. I was available for consultation. Star Age, MD, PhD [facility])

## 2019-05-21 NOTE — Progress Notes (Signed)
Messaged [employer] for new cpap supplies. Received. sy

## 2019-05-23 NOTE — Progress Notes (Signed)
Subjective: Mckenzie Tyler presents today for follow up of preventative diabetic foot care and painful callus(es) b/l feet. Aggravating factors include weightbearing with and without shoe gear. Pain is relieved with periodic professional debridement.   She relates she went to [facility] to surprise her Mom and Dad for their anniversary. She did get a pedicure and does not want her toenails debrided today. She voices no new pedal concerns on today's visit.  Allergies  Allergen Reactions  . Codeine Itching     Objective: There were no vitals filed for this visit.  Pt is a pleasant 49 y.o. year old Caucasian female  in NAD. AAO x 3.   Vascular Examination:  Capillary refill time to digits immediate b/l. Palpable DP pulses b/l. Palpable PT pulses b/l. Pedal hair present b/l. Skin temperature gradient within normal limits b/l.  Dermatological Examination: Pedal skin with normal turgor, texture and tone bilaterally. No open wounds bilaterally. No interdigital macerations bilaterally. Hyperkeratotic lesion(s) L hallux, R hallux, submet head 1 left foot and submet head 1 right foot.  No erythema, no edema, no drainage, no flocculence.  Musculoskeletal: Normal muscle strength 5/5 to all lower extremity muscle groups bilaterally, no gross bony deformities bilaterally, no pain crepitus or joint limitation noted with ROM b/l and patient ambulates independent of any assistive aids  Neurological: Protective sensation intact 5/5 intact bilaterally with 10g monofilament b/l. Vibratory sensation intact b/l. Proprioception intact bilaterally. Babinski reflex negative b/l. Clonus negative b/l.  Assessment: 1. Callus   2. Diabetes mellitus without complication (Mckenzie Tyler)    Plan: -Continue diabetic foot care principles. Literature dispensed on today.  -Callus(es) L hallux, R hallux, submet head 1 left foot and submet head 1 right foot were debrided without complication or incident. Total number debrided  =4. -Patient to continue soft, supportive shoe gear daily. -Patient to report any pedal injuries to medical professional immediately. -Patient/POA to call should there be question/concern in the interim.  Return in about 3 months (around 08/19/2019) for diabetic callus trim.

## 2019-05-31 ENCOUNTER — Other Ambulatory Visit: Payer: Self-pay | Admitting: Family Medicine

## 2019-05-31 MED ORDER — VICTOZA 18 MG/3ML ~~LOC~~ SOPN: 0.6000 mg | PEN_INJECTOR | Freq: Every day | SUBCUTANEOUS | 5 refills | Status: DC

## 2019-06-09 ENCOUNTER — Other Ambulatory Visit: Payer: Self-pay | Admitting: Family Medicine

## 2019-06-10 ENCOUNTER — Other Ambulatory Visit: Payer: Self-pay | Admitting: Family Medicine

## 2019-07-14 ENCOUNTER — Other Ambulatory Visit: Payer: Self-pay | Admitting: Family Medicine

## 2019-07-16 ENCOUNTER — Other Ambulatory Visit: Payer: Self-pay

## 2019-07-16 ENCOUNTER — Ambulatory Visit (INDEPENDENT_AMBULATORY_CARE_PROVIDER_SITE_OTHER): Payer: BC Managed Care – PPO | Admitting: Family Medicine

## 2019-07-16 VITALS — BP 140/64 | HR 101 | Temp 98.3°F | Wt 244.0 lb

## 2019-07-16 DIAGNOSIS — G4733 Obstructive sleep apnea (adult) (pediatric): Secondary | ICD-10-CM | POA: Diagnosis not present

## 2019-07-16 DIAGNOSIS — Z9989 Dependence on other enabling machines and devices: Secondary | ICD-10-CM

## 2019-07-16 DIAGNOSIS — E118 Type 2 diabetes mellitus with unspecified complications: Secondary | ICD-10-CM | POA: Diagnosis not present

## 2019-07-16 DIAGNOSIS — R5382 Chronic fatigue, unspecified: Secondary | ICD-10-CM

## 2019-07-16 DIAGNOSIS — E785 Hyperlipidemia, unspecified: Secondary | ICD-10-CM

## 2019-07-16 NOTE — Progress Notes (Signed)
Subjective:    Patient ID: Mckenzie Tyler, female    DOB: 1970/06/23, 49 y.o.   MRN: [id number]  Patient is here today to follow-up her diabetes.  She states that her fasting blood sugars remain elevated and can be as high as 170.  She is on 60 units of Lantus in the morning.  She is also on Victoza along with Actos.  She had to decrease her dose of Victoza to 1.2 mg subcu daily due to nausea.  Her 2-hour postprandial sugars are well controlled and are actually better than her fasting blood sugar in the morning.  Therefore she is interested in possibly increasing her basal insulin to help better control her diabetes.  However she reports mental fog and trouble concentrating.  She feels extremely tired and fatigued later in the day.  She does have obstructive sleep apnea however she is consistent in wearing her CPAP on a nightly basis and her machine is recording that she is having no events.  Therefore she does not believe that sleep apnea is causing her poor concentration, trouble focusing, and somnolence.  It does seem to coincide with drops in her blood sugar.  Patient is not regularly exercising.  Patient's BMI is 38.  She is not losing weight as we had hoped. Past Medical History:  Diagnosis Date  . Allergy   . Depression   . Diabetes mellitus type 2 with complications ([place])   . Hyperlipidemia 02/13/2013  . Smoker    Past Surgical History:  Procedure Laterality Date  . RETINOPATHY SURGERY  11/16/2017   due to diabetes   . TONSILLECTOMY AND ADENOIDECTOMY     age 39   Current Outpatient Medications on File Prior to Visit  Medication Sig Dispense Refill  . calcium carbonate (OS-CAL) 600 MG tablet Take 600 mg by mouth daily.    Marland Kitchen HUMALOG KWIKPEN 100 UNIT/ML KwikPen INJECT 15 TO 20 UNITS INTO THE SKIN 3 TIMES DAILY 15 mL 3  . insulin glargine (LANTUS) 100 UNIT/ML injection Inject 0.6 mLs (60 Units total) into the skin at bedtime. 30 mL 1  . Insulin Pen Needle (PEN NEEDLES) 32G X 5 MM MISC 1  Units by Does not apply route 3 (three) times daily. 100 each 2  . Insulin Syringe-Needle U-100 25G X 1" 1 ML MISC Use as directed 100 each 3  . liraglutide (VICTOZA) 18 MG/3ML SOPN Inject 0.1 mLs (0.6 mg total) into the skin daily. 3 pen 5  . losartan (COZAAR) 25 MG tablet Take 1 tablet by mouth once daily 90 tablet 1  . Magnesium 400 MG TABS Take by mouth.    . metFORMIN (GLUCOPHAGE) 1000 MG tablet TAKE 1 TABLET BY MOUTH TWICE DAILY WITH A MEAL 180 tablet 1  . Multiple Vitamin (MULTIVITAMIN) tablet Take 1 tablet by mouth daily.    Marland Kitchen OVER THE COUNTER MEDICATION daily. Selinium 200 mg herbal supplement daily    . pioglitazone (ACTOS) 30 MG tablet Take 1 tablet by mouth once daily 90 tablet 1  . POTASSIUM PO Take by mouth.    . simvastatin (ZOCOR) 20 MG tablet TAKE 1 TABLET BY MOUTH ONCE DAILY AT  6PM 90 tablet 1   No current facility-administered medications on file prior to visit.   Allergies  Allergen Reactions  . Codeine Itching   Social History   Socioeconomic History  . Marital status: Single    Spouse name: Not on file  . Number of children: Not on file  . Years  of education: Not on file  . Highest education level: Not on file  Occupational History  . Not on file  Tobacco Use  . Smoking status: Former Smoker    Packs/day: 0.25    Types: Cigarettes    Quit date: 01/21/2018    Years since quitting: 1.4  . Smokeless tobacco: Never Used  . Tobacco comment: has been trying to quit  Substance and Sexual Activity  . Alcohol use: No    Alcohol/week: 0.0 standard drinks  . Drug use: No  . Sexual activity: Yes    Birth control/protection: None  Other Topics Concern  . Not on file  Social History Narrative  . Not on file   Social Determinants of Health   Financial Resource Strain:   . Difficulty of Paying Living Expenses:   Food Insecurity:   . Worried About Charity fundraiser in the Last Year:   . Arboriculturist in the Last Year:   Transportation Needs:   . Consulting civil engineer (Medical):   Marland Kitchen Lack of Transportation (Non-Medical):   Physical Activity:   . Days of Exercise per Week:   . Minutes of Exercise per Session:   Stress:   . Feeling of Stress :   Social Connections:   . Frequency of Communication with Friends and Family:   . Frequency of Social Gatherings with Friends and Family:   . Attends Religious Services:   . Active Member of Clubs or Organizations:   . Attends Archivist Meetings:   Marland Kitchen Marital Status:   Intimate Partner Violence:   . Fear of Current or Ex-Partner:   . Emotionally Abused:   Marland Kitchen Physically Abused:   . Sexually Abused:       Review of Systems  All other systems reviewed and are negative.      Objective:   Physical Exam Vitals reviewed.  Constitutional:      General: She is not in acute distress.    Appearance: She is not ill-appearing.  Cardiovascular:     Rate and Rhythm: Normal rate and regular rhythm.     Pulses: Normal pulses.     Heart sounds: Normal heart sounds. No murmur heard.  No gallop.   Pulmonary:     Effort: Pulmonary effort is normal. No respiratory distress.     Breath sounds: Normal breath sounds. No stridor. No wheezing, rhonchi or rales.  Chest:     Chest wall: No tenderness.  Abdominal:     General: Bowel sounds are normal. There is no distension.     Tenderness: There is no guarding or rebound.     Hernia: No hernia is present.  Musculoskeletal:     Right lower leg: No edema.     Left lower leg: No edema.  Neurological:     Mental Status: She is alert.        Assessment & Plan:  Type 2 diabetes mellitus with complication (HCC) - Plan: Hemoglobin A1c, COMPLETE METABOLIC PANEL WITH GFR, CBC with Differential/Platelet  Chronic fatigue - Plan: Vitamin B12, TSH  Hyperlipidemia, unspecified hyperlipidemia type  OSA on CPAP  Blood pressure today is adequately controlled.  I believe her fatigue, is likely due to her obesity.  I believe if we got the patient  exercising 30 minutes a day 5 days a week, if we can get her to lose 30 to 40 pounds, her energy level and focus and drive and motivation would improve dramatically.  We discussed trying  a stimulant such as Adderall for possible ADD however I explained that I feel that this has increased cardiovascular risk giving her other medical problems.  Instead I would like to see the patient work with a nutritionist, work with a Physiological scientist, begin a regular exercise regimen in an effort to try to lose weight.  I would also like to optimize her medications to help her lose weight.  Therefore I recommended discontinuing Actos and replacing with Jardiance 25 mg a day.  I did caution her about genital yeast infections.  If hemoglobin A1c is greater than 7, we may want to increase her Lantus in order to drive her fasting blood sugar closer to 130.

## 2019-07-17 LAB — COMPLETE METABOLIC PANEL WITH GFR
AG Ratio: 1.9 (calc) (ref 1.0–2.5)
ALT: 37 U/L — ABNORMAL HIGH (ref 6–29)
AST: 27 U/L (ref 10–35)
Albumin: 4.3 g/dL (ref 3.6–5.1)
Alkaline phosphatase (APISO): 37 U/L (ref 37–153)
BUN: 16 mg/dL (ref 7–25)
CO2: 25 mmol/L (ref 20–32)
Calcium: 9.7 mg/dL (ref 8.6–10.4)
Chloride: 102 mmol/L (ref 98–110)
Creat: 0.8 mg/dL (ref 0.50–1.05)
GFR, Est African American: 100 mL/min/{1.73_m2} (ref >=60)
GFR, Est Non African American: 86 mL/min/{1.73_m2} (ref >=60)
Globulin: 2.3 g/dL (calc) (ref 1.9–3.7)
Glucose, Bld: 166 mg/dL — ABNORMAL HIGH (ref 65–99)
Potassium: 4.5 mmol/L (ref 3.5–5.3)
Sodium: 139 mmol/L (ref 135–146)
Total Bilirubin: 0.4 mg/dL (ref 0.2–1.2)
Total Protein: 6.6 g/dL (ref 6.1–8.1)

## 2019-07-17 LAB — CBC WITH DIFFERENTIAL/PLATELET
Absolute Monocytes: 642 cells/uL (ref 200–950)
Basophils Absolute: 104 cells/uL (ref 0–200)
Basophils Relative: 1.5 %
Eosinophils Absolute: 531 cells/uL — ABNORMAL HIGH (ref 15–500)
Eosinophils Relative: 7.7 %
HCT: 40 % (ref 35.0–45.0)
Hemoglobin: 13.8 g/dL (ref 11.7–15.5)
Lymphs Abs: 1518 cells/uL (ref 850–3900)
MCH: 29.9 pg (ref 27.0–33.0)
MCHC: 34.5 g/dL (ref 32.0–36.0)
MCV: 86.6 fL (ref 80.0–100.0)
MPV: 10.9 fL (ref 7.5–12.5)
Monocytes Relative: 9.3 %
Neutro Abs: 4106 cells/uL (ref 1500–7800)
Neutrophils Relative %: 59.5 %
Platelets: 234 10*3/uL (ref 140–400)
RBC: 4.62 10*6/uL (ref 3.80–5.10)
RDW: 13.6 % (ref 11.0–15.0)
Total Lymphocyte: 22 %
WBC: 6.9 10*3/uL (ref 3.8–10.8)

## 2019-07-17 LAB — VITAMIN B12: Vitamin B-12: 594 pg/mL (ref 200–1100)

## 2019-07-17 LAB — HEMOGLOBIN A1C
Hgb A1c MFr Bld: 7.2 % of total Hgb — ABNORMAL HIGH (ref <5.7)
Mean Plasma Glucose: 160 (calc)
eAG (mmol/L): 8.9 (calc)

## 2019-07-17 LAB — TSH: TSH: 2.09 mIU/L

## 2019-08-09 ENCOUNTER — Encounter: Payer: Self-pay | Admitting: Family Medicine

## 2019-08-09 MED ORDER — EMPAGLIFLOZIN 25 MG PO TABS
25.0000 mg | ORAL_TABLET | Freq: Every day | ORAL | 3 refills | Status: DC
Start: 2019-08-09 — End: 2019-12-02

## 2019-08-09 MED ORDER — INSULIN GLARGINE 100 UNIT/ML ~~LOC~~ SOLN: 60.0000 [IU] | Freq: Every day | SUBCUTANEOUS | 1 refills | Status: DC

## 2019-08-23 ENCOUNTER — Encounter: Payer: Self-pay | Admitting: Podiatry

## 2019-08-23 ENCOUNTER — Other Ambulatory Visit: Payer: Self-pay

## 2019-08-23 ENCOUNTER — Ambulatory Visit (INDEPENDENT_AMBULATORY_CARE_PROVIDER_SITE_OTHER): Payer: BC Managed Care – PPO | Admitting: Podiatry

## 2019-08-23 DIAGNOSIS — L84 Corns and callosities: Secondary | ICD-10-CM

## 2019-08-23 DIAGNOSIS — E119 Type 2 diabetes mellitus without complications: Secondary | ICD-10-CM

## 2019-08-26 NOTE — Progress Notes (Signed)
Subjective: Mckenzie Tyler presents today for follow up of preventative diabetic foot care and painful callus(es) b/l feet. Aggravating factors include weightbearing with and without shoe gear. Pain is relieved with periodic professional debridement.   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 She relates no new pedal concerns on today's visit.  Allergies  Allergen Reactions   Codeine Itching     Objective: There were no vitals filed for this visit.  Pt is a pleasant 49 y.o. year old Caucasian female  in NAD. AAO x 3.   Vascular Examination:  Capillary refill time to digits immediate b/l. Palpable DP pulses b/l. Palpable PT pulses b/l. Pedal hair present b/l. Skin temperature gradient within normal limits b/l.  Dermatological Examination: Pedal skin with normal turgor, texture and tone bilaterally. No open wounds bilaterally. No interdigital macerations bilaterally. Toenails 1-5 b/l well maintained with adequate length. No erythema, no edema, no drainage, no flocculence. Hyperkeratotic lesion(s) L hallux, R hallux, submet head 1 left foot and submet head 1 right foot.  No erythema, no edema, no drainage, no flocculence.  Musculoskeletal: Normal muscle strength 5/5 to all lower extremity muscle groups bilaterally, no gross bony deformities bilaterally, no pain crepitus or joint limitation noted with ROM b/l and patient ambulates independent of any assistive aids  Neurological: Protective sensation intact 5/5 intact bilaterally with 10g monofilament b/l. Vibratory sensation intact b/l. Proprioception intact bilaterally. Babinski reflex negative b/l. Clonus negative b/l.  Assessment: 1. Callus   2. Diabetes mellitus without complication ([place])    Plan: -Continue diabetic foot care principles. Literature dispensed on today.  -Callus(es) L hallux, R hallux, submet head 1 left foot and submet head 1 right foot were debrided without complication or incident. Total  number debrided =4. -Patient to continue soft, supportive shoe gear daily. -Patient to report any pedal injuries to medical professional immediately. -Patient/POA to call should there be question/concern in the interim.  Return in about 3 months (around 11/23/2019) for diabetic nail and callus trim.

## 2019-09-03 ENCOUNTER — Other Ambulatory Visit: Payer: Self-pay | Admitting: Family Medicine

## 2019-10-18 ENCOUNTER — Ambulatory Visit: Payer: BC Managed Care – PPO | Admitting: Family Medicine

## 2019-10-21 ENCOUNTER — Other Ambulatory Visit: Payer: Self-pay

## 2019-10-21 ENCOUNTER — Ambulatory Visit (INDEPENDENT_AMBULATORY_CARE_PROVIDER_SITE_OTHER): Payer: BC Managed Care – PPO | Admitting: Family Medicine

## 2019-10-21 VITALS — BP 108/68 | HR 101 | Temp 98.1°F | Ht 67.0 in | Wt 232.0 lb

## 2019-10-21 DIAGNOSIS — E785 Hyperlipidemia, unspecified: Secondary | ICD-10-CM | POA: Diagnosis not present

## 2019-10-21 DIAGNOSIS — E118 Type 2 diabetes mellitus with unspecified complications: Secondary | ICD-10-CM

## 2019-10-21 DIAGNOSIS — I10 Essential (primary) hypertension: Secondary | ICD-10-CM

## 2019-10-21 NOTE — Progress Notes (Signed)
Subjective:    Patient ID: Mckenzie Tyler, female    DOB: [birth year], 49 y.o.   MRN: [id number]  Patient is here today to follow-up her diabetes.  She has increased her Lantus to 80 units.  On the 80 units of Lantus, her fasting blood sugars are now around 120!Marland Kitchen  Her 2-hour postprandial sugars in the evening are around 140-159.  The sound excellent.  She denies any hypoglycemia.  She denies any polyuria, polydipsia, or blurry vision.  She denies any neuropathy in her feet.  She states that she sees a retina specialist every 3 months.  Therefore her chart is incorrect.  Her diabetic eye exam is up-to-date.  I asked her to have them send [place] a copy on her next visit.  Her blood pressure today is well controlled at 108/68.  She declines a flu shot today.  She prefers to receive that at work.  She is overdue for a Pap smear.  I have recommended this to her but she prefers to see a female provider and would like to schedule this with my partner.  She has had her Covid vaccination in March and April.  Otherwise she is doing well Past Medical History:  Diagnosis Date  . Allergy   . Depression   . Diabetes mellitus type 2 with complications ([place])   . Hyperlipidemia 02/13/2013  . Smoker    Past Surgical History:  Procedure Laterality Date  . RETINOPATHY SURGERY  11/16/2017   due to diabetes   . TONSILLECTOMY AND ADENOIDECTOMY     age 24   Current Outpatient Medications on File Prior to Visit  Medication Sig Dispense Refill  . calcium carbonate (OS-CAL) 600 MG tablet Take 600 mg by mouth daily.    . empagliflozin (JARDIANCE) 25 MG TABS tablet Take 1 tablet (25 mg total) by mouth daily before breakfast. 30 tablet 3  . HUMALOG KWIKPEN 100 UNIT/ML KwikPen INJECT 15 TO 20 UNITS INTO THE SKIN 3 TIMES DAILY 15 mL 3  . insulin glargine (LANTUS) 100 UNIT/ML injection Inject 0.6-0.8 mLs (60-80 Units total) into the skin at bedtime. Dx: E11.9 30 mL 1  . Insulin Pen Needle (PEN NEEDLES) 32G X 5 MM MISC 1 Units  by Does not apply route 3 (three) times daily. 100 each 2  . Insulin Syringe-Needle U-100 25G X 1" 1 ML MISC Use as directed 100 each 3  . liraglutide (VICTOZA) 18 MG/3ML SOPN Inject 0.1 mLs (0.6 mg total) into the skin daily. 3 pen 5  . losartan (COZAAR) 25 MG tablet Take 1 tablet by mouth once daily 90 tablet 1  . Magnesium 400 MG TABS Take by mouth.    . metFORMIN (GLUCOPHAGE) 1000 MG tablet TAKE 1 TABLET BY MOUTH TWICE DAILY WITH A MEAL 180 tablet 1  . Multiple Vitamin (MULTIVITAMIN) tablet Take 1 tablet by mouth daily.    Marland Kitchen OVER THE COUNTER MEDICATION daily. Selinium 200 mg herbal supplement daily    . POTASSIUM PO Take by mouth.    . simvastatin (ZOCOR) 20 MG tablet TAKE 1 TABLET BY MOUTH ONCE DAILY AT 6 PM. 90 tablet 2   No current facility-administered medications on file prior to visit.   Allergies  Allergen Reactions  . Codeine Itching   Social History   Socioeconomic History  . Marital status: Single    Spouse name: Not on file  . Number of children: Not on file  . Years of education: Not on file  . Highest education level:  Not on file  Occupational History  . Not on file  Tobacco Use  . Smoking status: Former Smoker    Packs/day: 0.25    Types: Cigarettes    Quit date: 01/21/2018    Years since quitting: 1.7  . Smokeless tobacco: Never Used  . Tobacco comment: has been trying to quit  Substance and Sexual Activity  . Alcohol use: No    Alcohol/week: 0.0 standard drinks  . Drug use: No  . Sexual activity: Yes    Birth control/protection: None  Other Topics Concern  . Not on file  Social History Narrative  . Not on file   Social Determinants of Health   Financial Resource Strain:   . Difficulty of Paying Living Expenses: Not on file  Food Insecurity:   . Worried About Charity fundraiser in the Last Year: Not on file  . Ran Out of Food in the Last Year: Not on file  Transportation Needs:   . Lack of Transportation (Medical): Not on file  . Lack of  Transportation (Non-Medical): Not on file  Physical Activity:   . Days of Exercise per Week: Not on file  . Minutes of Exercise per Session: Not on file  Stress:   . Feeling of Stress : Not on file  Social Connections:   . Frequency of Communication with Friends and Family: Not on file  . Frequency of Social Gatherings with Friends and Family: Not on file  . Attends Religious Services: Not on file  . Active Member of Clubs or Organizations: Not on file  . Attends Archivist Meetings: Not on file  . Marital Status: Not on file  Intimate Partner Violence:   . Fear of Current or Ex-Partner: Not on file  . Emotionally Abused: Not on file  . Physically Abused: Not on file  . Sexually Abused: Not on file      Review of Systems  All other systems reviewed and are negative.      Objective:   Physical Exam Vitals reviewed.  Constitutional:      General: She is not in acute distress.    Appearance: She is not ill-appearing.  Cardiovascular:     Rate and Rhythm: Normal rate and regular rhythm.     Pulses: Normal pulses.     Heart sounds: Normal heart sounds. No murmur heard.  No gallop.   Pulmonary:     Effort: Pulmonary effort is normal. No respiratory distress.     Breath sounds: Normal breath sounds. No stridor. No wheezing, rhonchi or rales.  Chest:     Chest wall: No tenderness.  Abdominal:     General: Bowel sounds are normal. There is no distension.     Tenderness: There is no guarding or rebound.     Hernia: No hernia is present.  Musculoskeletal:     Right lower leg: No edema.     Left lower leg: No edema.  Neurological:     Mental Status: She is alert.        Assessment & Plan:  Type 2 diabetes mellitus with complication (HCC) - Plan: CBC with Differential/Platelet, COMPLETE METABOLIC PANEL WITH GFR, Lipid panel, Microalbumin, urine, Hemoglobin A1c  Hyperlipidemia, unspecified hyperlipidemia type  Benign essential HTN  I am very proud of this  patient.  She is doing much better controlling her sugars.  I will check a hemoglobin A1c but I anticipated will be less than 7.  I would like to see the patient  on less insulin if possible.  I explained to the patient the only way this will be able to happen is if she starts to increase her aerobic exercise ultimately to a goal of 30 minutes a day 5 days a week.  Then I believe we will be able to wean her down on her insulin.  I recommended a flu shot but she prefers to receive this elsewhere.  She declines HIV and hepatitis C screening.  Covid shot is up-to-date.  Recommended a Pap smear but she would like to schedule this with a female provider.  Blood pressure is excellent.  I will check a fasting lipid panel.

## 2019-10-22 LAB — HEMOGLOBIN A1C
Hgb A1c MFr Bld: 7.1 % of total Hgb — ABNORMAL HIGH (ref <5.7)
Mean Plasma Glucose: 157 (calc)
eAG (mmol/L): 8.7 (calc)

## 2019-10-22 LAB — CBC WITH DIFFERENTIAL/PLATELET
Absolute Monocytes: 598 cells/uL (ref 200–950)
Basophils Absolute: 49 cells/uL (ref 0–200)
Basophils Relative: 0.8 %
Eosinophils Absolute: 354 cells/uL (ref 15–500)
Eosinophils Relative: 5.8 %
HCT: 44.6 % (ref 35.0–45.0)
Hemoglobin: 14.8 g/dL (ref 11.7–15.5)
Lymphs Abs: 1147 cells/uL (ref 850–3900)
MCH: 29.3 pg (ref 27.0–33.0)
MCHC: 33.2 g/dL (ref 32.0–36.0)
MCV: 88.3 fL (ref 80.0–100.0)
MPV: 10.7 fL (ref 7.5–12.5)
Monocytes Relative: 9.8 %
Neutro Abs: 3953 cells/uL (ref 1500–7800)
Neutrophils Relative %: 64.8 %
Platelets: 223 10*3/uL (ref 140–400)
RBC: 5.05 10*6/uL (ref 3.80–5.10)
RDW: 13.3 % (ref 11.0–15.0)
Total Lymphocyte: 18.8 %
WBC: 6.1 10*3/uL (ref 3.8–10.8)

## 2019-10-22 LAB — LIPID PANEL
Cholesterol: 145 mg/dL (ref <200)
HDL: 31 mg/dL — ABNORMAL LOW (ref >=50)
Non-HDL Cholesterol (Calc): 114 mg/dL (calc) (ref <130)
Total CHOL/HDL Ratio: 4.7 (calc) (ref <5.0)
Triglycerides: 439 mg/dL — ABNORMAL HIGH (ref <150)

## 2019-10-22 LAB — COMPLETE METABOLIC PANEL WITH GFR
AG Ratio: 1.9 (calc) (ref 1.0–2.5)
ALT: 39 U/L — ABNORMAL HIGH (ref 6–29)
AST: 23 U/L (ref 10–35)
Albumin: 4.5 g/dL (ref 3.6–5.1)
Alkaline phosphatase (APISO): 42 U/L (ref 37–153)
BUN: 16 mg/dL (ref 7–25)
CO2: 26 mmol/L (ref 20–32)
Calcium: 9.7 mg/dL (ref 8.6–10.4)
Chloride: 102 mmol/L (ref 98–110)
Creat: 0.72 mg/dL (ref 0.50–1.05)
GFR, Est African American: 112 mL/min/{1.73_m2} (ref >=60)
GFR, Est Non African American: 97 mL/min/{1.73_m2} (ref >=60)
Globulin: 2.4 g/dL (calc) (ref 1.9–3.7)
Glucose, Bld: 156 mg/dL — ABNORMAL HIGH (ref 65–99)
Potassium: 3.9 mmol/L (ref 3.5–5.3)
Sodium: 138 mmol/L (ref 135–146)
Total Bilirubin: 0.5 mg/dL (ref 0.2–1.2)
Total Protein: 6.9 g/dL (ref 6.1–8.1)

## 2019-10-22 LAB — MICROALBUMIN, URINE: Microalb, Ur: 0.4 mg/dL

## 2019-11-14 ENCOUNTER — Other Ambulatory Visit: Payer: Self-pay | Admitting: Family Medicine

## 2019-11-15 ENCOUNTER — Encounter: Payer: Self-pay | Admitting: Family Medicine

## 2019-11-29 ENCOUNTER — Other Ambulatory Visit: Payer: Self-pay

## 2019-11-29 ENCOUNTER — Ambulatory Visit (INDEPENDENT_AMBULATORY_CARE_PROVIDER_SITE_OTHER): Payer: BC Managed Care – PPO | Admitting: Podiatry

## 2019-11-29 ENCOUNTER — Encounter: Payer: Self-pay | Admitting: Podiatry

## 2019-11-29 DIAGNOSIS — E119 Type 2 diabetes mellitus without complications: Secondary | ICD-10-CM | POA: Diagnosis not present

## 2019-11-29 DIAGNOSIS — L84 Corns and callosities: Secondary | ICD-10-CM

## 2019-12-01 ENCOUNTER — Other Ambulatory Visit: Payer: Self-pay | Admitting: Family Medicine

## 2019-12-01 NOTE — Progress Notes (Signed)
Subjective: Mckenzie Tyler presents today for follow up of preventative diabetic foot care and painful callus(es) b/l feet. Aggravating factors include weightbearing with and without shoe gear. Pain is relieved with periodic professional debridement.   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 She relates no new pedal concerns on today's visit.  Allergies  Allergen Reactions  . Codeine Itching    Objective: There were no vitals filed for this visit.  Pt is a pleasant 49 y.o. year old Caucasian female  in NAD. AAO x 3.   Vascular Examination:  Capillary refill time to digits immediate b/l. Palpable DP pulses b/l. Palpable PT pulses b/l. Pedal hair present b/l. Skin temperature gradient within normal limits b/l.  Dermatological Examination: Pedal skin with normal turgor, texture and tone bilaterally. No open wounds bilaterally. No interdigital macerations bilaterally. Toenails 1-5 b/l well maintained with adequate length. No erythema, no edema, no drainage, no flocculence. Hyperkeratotic lesion(s) L hallux, R hallux, submet head 1 left foot and submet head 1 right foot.  No erythema, no edema, no drainage, no flocculence.  Musculoskeletal: Normal muscle strength 5/5 to all lower extremity muscle groups bilaterally, no gross bony deformities bilaterally, no pain crepitus or joint limitation noted with ROM b/l and patient ambulates independent of any assistive aids  Neurological: Protective sensation intact 5/5 intact bilaterally with 10g monofilament b/l. Vibratory sensation intact b/l. Proprioception intact bilaterally. Babinski reflex negative b/l. Clonus negative b/l.  Assessment: 1. Callus   2. Diabetes mellitus without complication ([place])    Plan: -Continue diabetic foot care principles. Literature dispensed on today.  -Callus(es) L hallux, R hallux, submet head 1 left foot and submet head 1 right foot were debrided without complication or incident. Total  number debrided =4. -Patient to continue soft, supportive shoe gear daily. -Patient to report any pedal injuries to medical professional immediately. -Patient/POA to call should there be question/concern in the interim.  Return in about 3 months (around 02/29/2020) for diabetic corn(s)/callus(es).

## 2019-12-02 ENCOUNTER — Other Ambulatory Visit: Payer: Self-pay | Admitting: Family Medicine

## 2019-12-09 ENCOUNTER — Other Ambulatory Visit: Payer: Self-pay | Admitting: Family Medicine

## 2019-12-17 IMAGING — DX LEFT KNEE - COMPLETE 4+ VIEW
4 series · 4 of 4 positions shown · non-contrast
Comparison: None.

CLINICAL DATA: Left pain.  No trauma.

EXAM:
LEFT KNEE - COMPLETE 4+ VIEW

[dg knee complete 4 views left (1 of 4)]
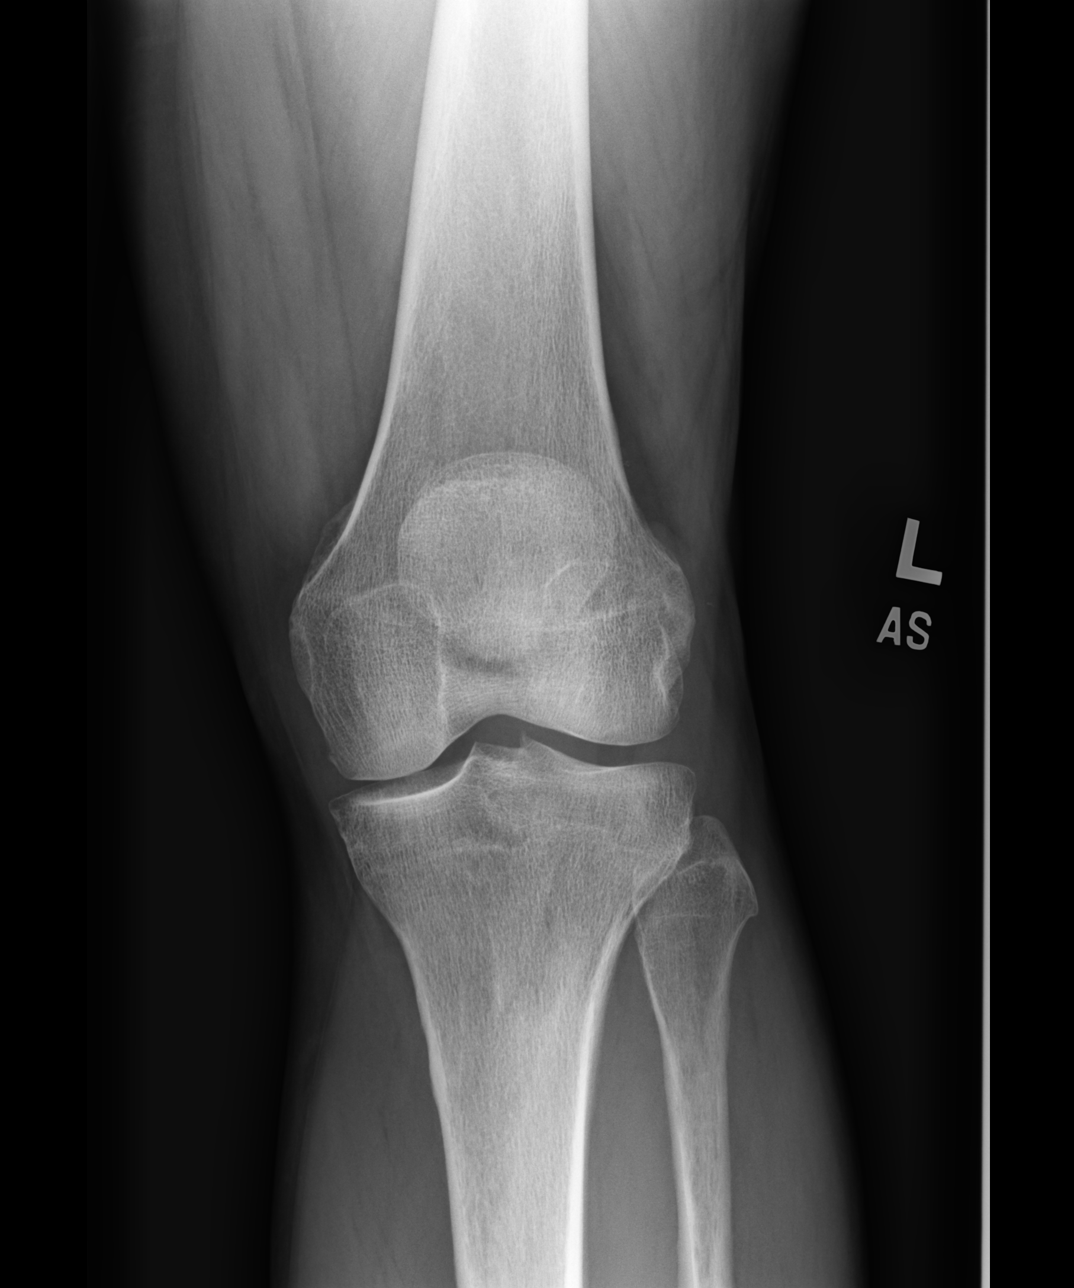

[dg knee complete 4 views left (2 of 4)]
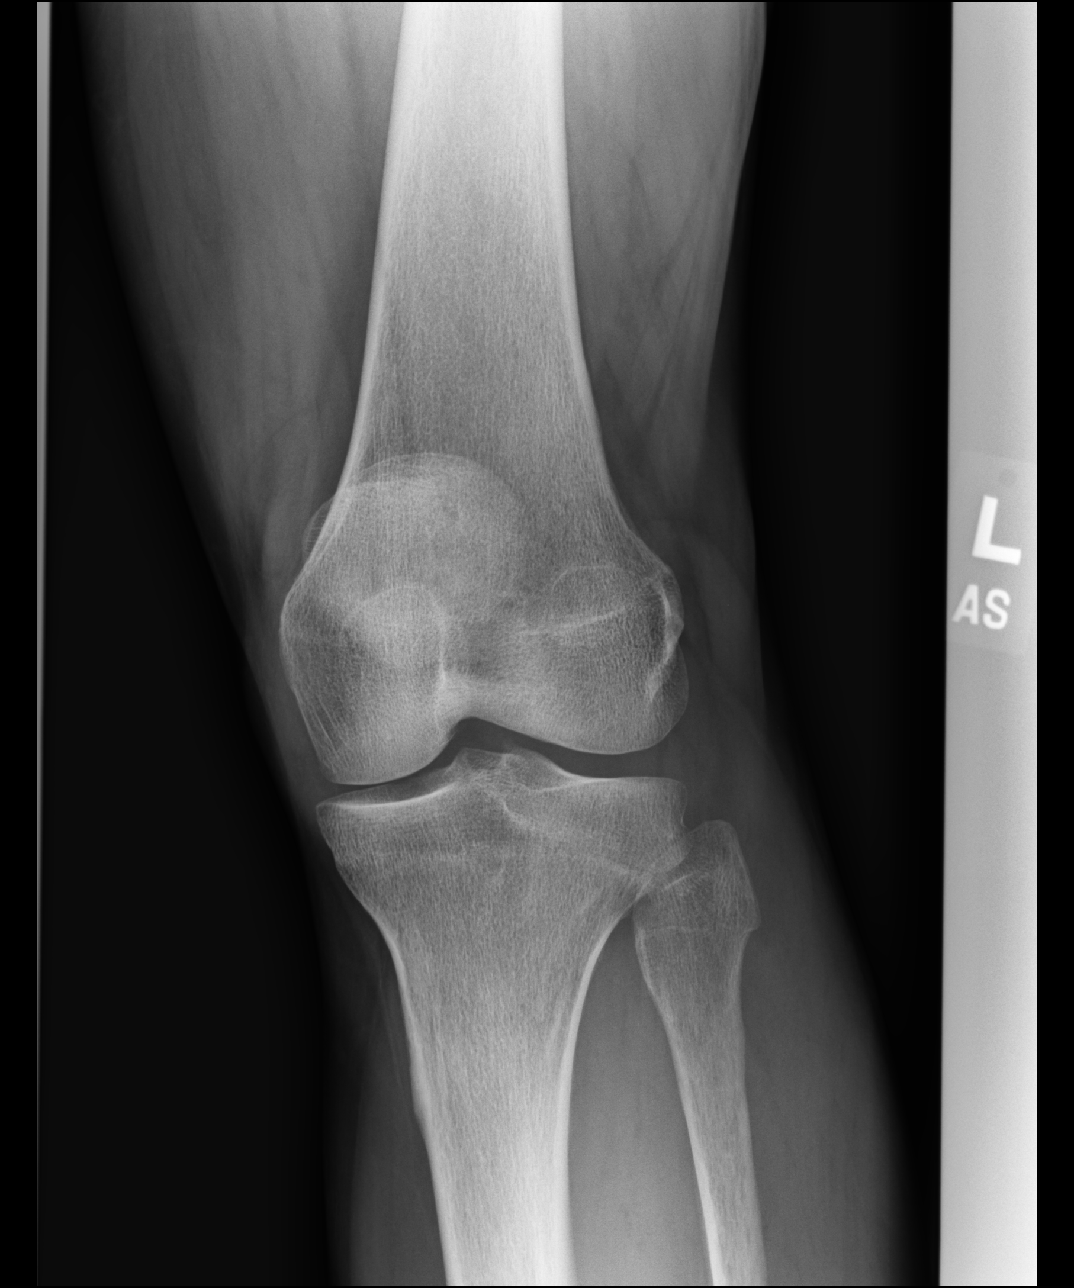

[dg knee complete 4 views left (3 of 4)]
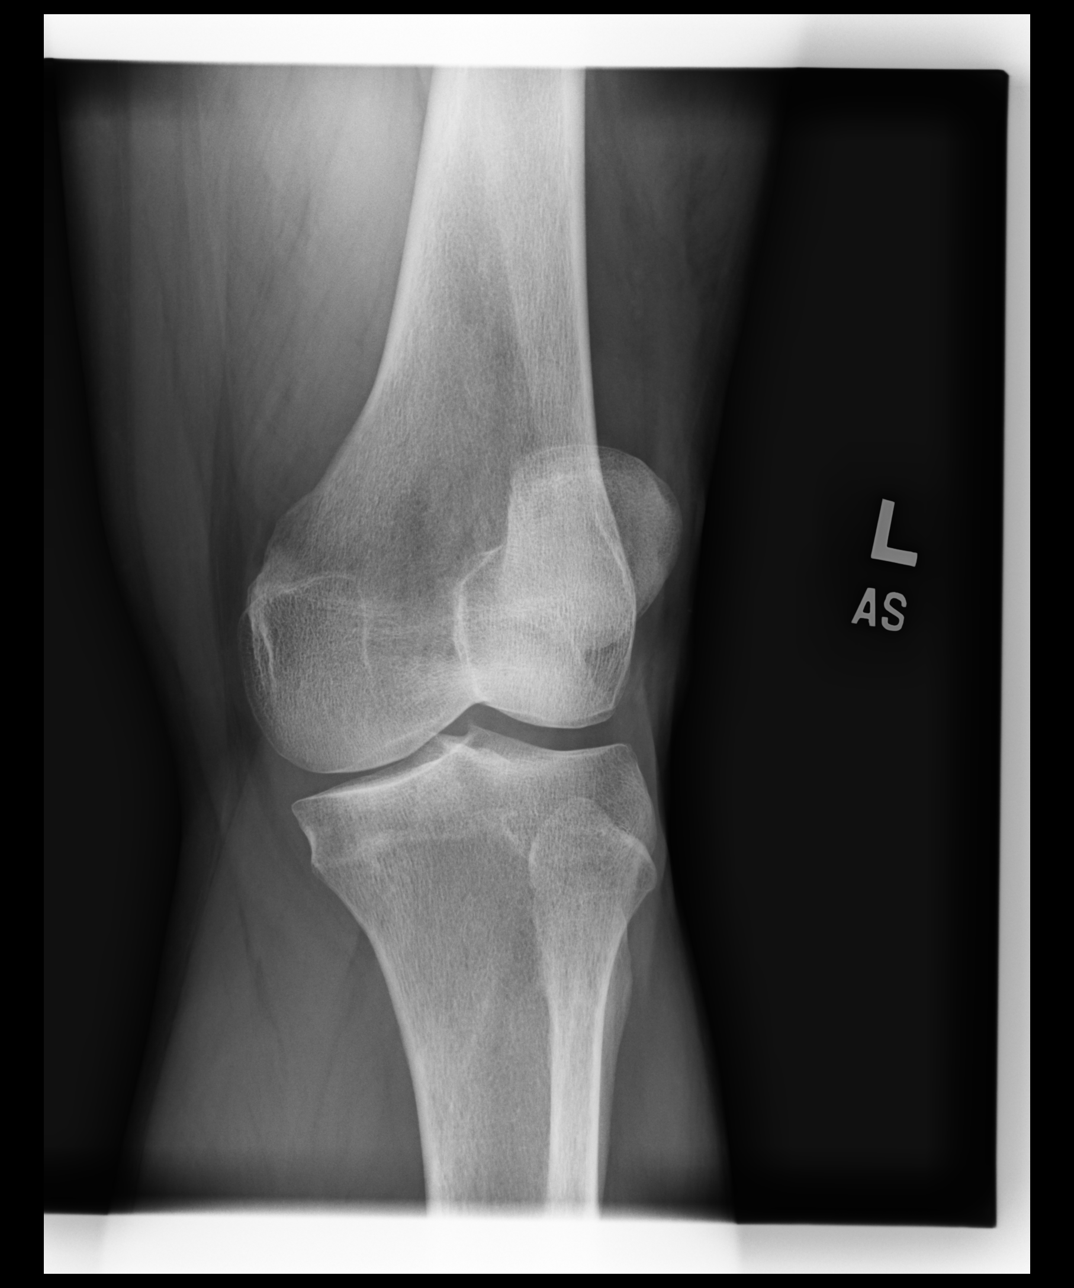

[dg knee complete 4 views left (4 of 4)]
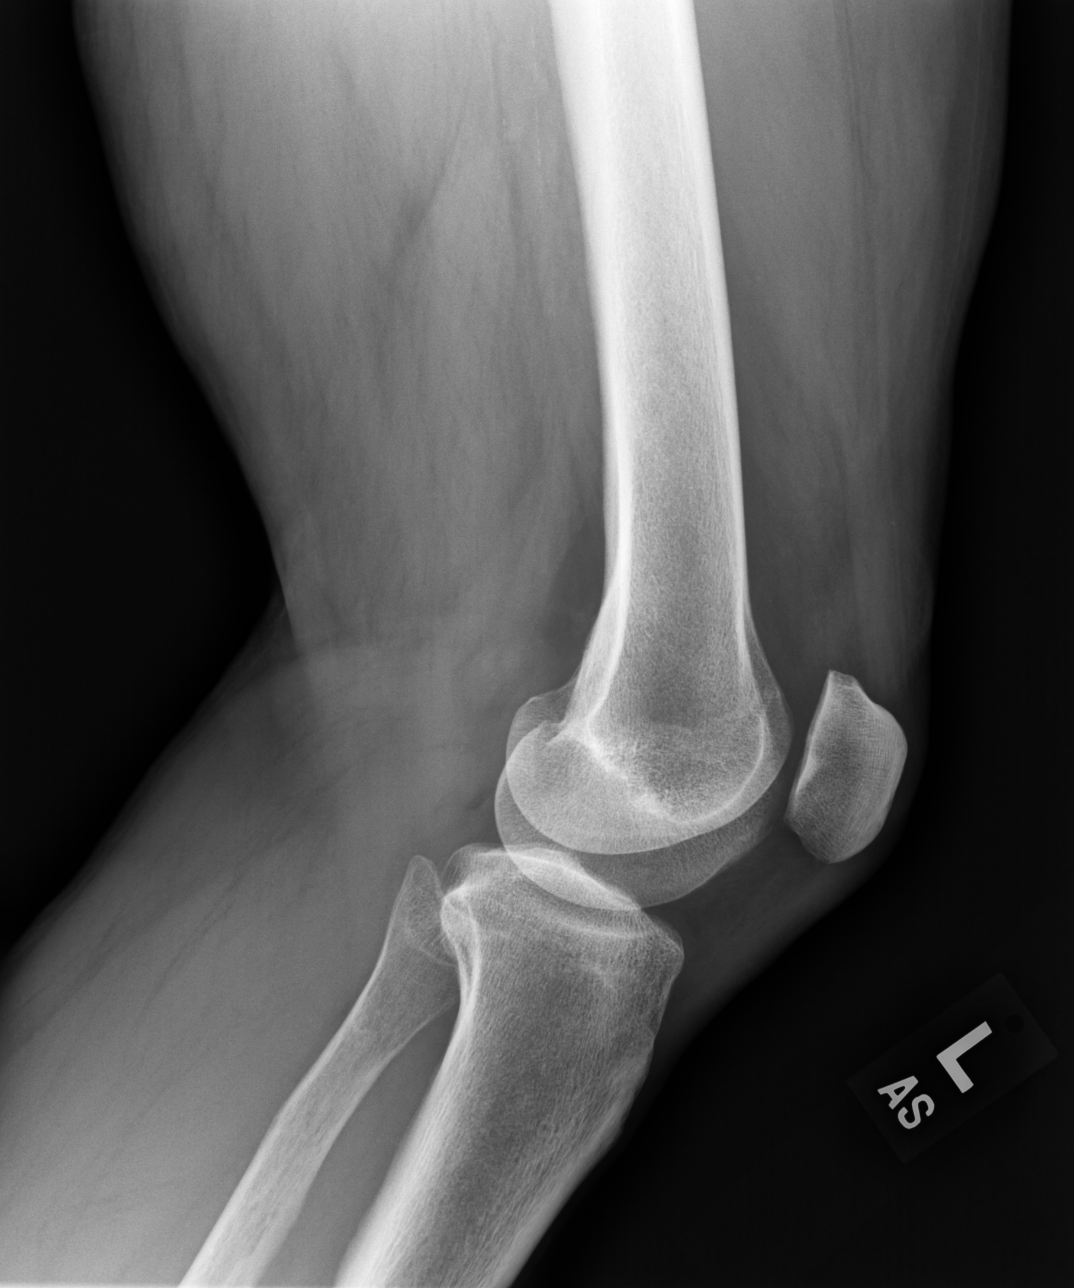

[4 of 4 positions shown; findings below may reference images not displayed]

FINDINGS: No evidence of fracture, dislocation, or joint effusion. No evidence
of arthropathy or other focal bone abnormality. Soft tissues are
unremarkable.
IMPRESSION: Negative.

## 2019-12-31 ENCOUNTER — Other Ambulatory Visit: Payer: Self-pay | Admitting: Family Medicine

## 2020-01-02 ENCOUNTER — Other Ambulatory Visit: Payer: Self-pay | Admitting: Family Medicine

## 2020-01-15 ENCOUNTER — Encounter: Payer: BC Managed Care – PPO | Admitting: Family Medicine

## 2020-01-20 ENCOUNTER — Ambulatory Visit: Payer: BC Managed Care – PPO | Admitting: Family Medicine

## 2020-02-01 ENCOUNTER — Other Ambulatory Visit: Payer: Self-pay | Admitting: Family Medicine

## 2020-02-10 ENCOUNTER — Other Ambulatory Visit: Payer: Self-pay

## 2020-02-10 ENCOUNTER — Telehealth (INDEPENDENT_AMBULATORY_CARE_PROVIDER_SITE_OTHER): Payer: BC Managed Care – PPO | Admitting: Family Medicine

## 2020-02-10 DIAGNOSIS — E118 Type 2 diabetes mellitus with unspecified complications: Secondary | ICD-10-CM | POA: Diagnosis not present

## 2020-02-10 DIAGNOSIS — I10 Essential (primary) hypertension: Secondary | ICD-10-CM | POA: Diagnosis not present

## 2020-02-10 DIAGNOSIS — E785 Hyperlipidemia, unspecified: Secondary | ICD-10-CM

## 2020-02-10 MED ORDER — BLOOD GLUCOSE TEST VI STRP: ORAL_STRIP | 1 refills | Status: DC

## 2020-02-10 NOTE — Progress Notes (Signed)
Subjective:    Patient ID: Mckenzie Tyler, female    DOB: 05-04-1970, 50 y.o.   MRN: [id number]  HPI Patient is a very pleasant 50 year old Caucasian female being seen today for diabetes checkup.  She consents to be seen via telephone.  She is currently at home.  I am currently in my office.  Phone call began at [phone].  Phone call concluded at 850.  Patient is checking her blood sugars 3 times a day.  Her morning fasting blood sugars are between 130 and 140 on average.  Her 2-hour postprandial sugars are between 160 and 180.  She checks her blood pressure several times a week and it is always less than 140/90.  She has lost some weight.  Her weight is down from 232 at her last visit to down to 218 pounds on her scales at home.  She has had both of her COVID vaccines as well as her booster.  Overall she is doing well with no chest pain, no shortness of breath, no dyspnea on exertion.  She denies any neuropathy in her feet.  She denies any blurry vision.  She denies any yeast infections from Jardiance or nausea related to Victoza.  She is not smoking. Past Medical History:  Diagnosis Date  . Allergy   . Depression   . Diabetes mellitus type 2 with complications ([place])   . Diabetes mellitus without complication ([place])    [facility]rees[facility] 01/12/2020  . Hyperlipidemia 02/13/2013  . Smoker    Past Surgical History:  Procedure Laterality Date  . EYE SURGERY N/A    [facility] 01/12/2020  . RETINOPATHY SURGERY  11/16/2017   due to diabetes   . TONSILLECTOMY AND ADENOIDECTOMY     age 50   Current Outpatient Medications on File Prior to Visit  Medication Sig Dispense Refill  . calcium carbonate (OS-CAL) 600 MG tablet Take 600 mg by mouth daily.    Marland Kitchen HUMALOG KWIKPEN 100 UNIT/ML KwikPen INJECT 15 TO 20 UNITS SUBCUTANEOUSLY THREE TIMES DAILY (Patient taking differently: 6-12 units with meals) 15 mL 0  . JARDIANCE 25 MG TABS tablet TAKE 1 TABLET BY MOUTH ONCE DAILY BEFORE BREAKFAST 30 tablet 0  . LANTUS 100 UNIT/ML  injection INJECT 0.6 TO 0.8 MLS (60 TO 80 UNITS TOTAL) INTO THE SKIN AT BEDTIME (Patient taking differently: 60units qhs) 30 mL 2  . Magnesium 400 MG TABS Take by mouth.    . metFORMIN (GLUCOPHAGE) 1000 MG tablet TAKE 1 TABLET BY MOUTH TWICE DAILY WITH A MEAL 180 tablet 0  . Multiple Vitamin (MULTIVITAMIN) tablet Take 1 tablet by mouth daily.    Marland Kitchen POTASSIUM PO Take by mouth.    . simvastatin (ZOCOR) 20 MG tablet TAKE 1 TABLET BY MOUTH ONCE DAILY AT 6 PM. 90 tablet 2  . VICTOZA 18 MG/3ML SOPN INJECT 1.8MG  INTO THE SKIN DAILY 9 mL 0  . Insulin Pen Needle (PEN NEEDLES) 32G X 5 MM MISC 1 Units by Does not apply route 3 (three) times daily. 100 each 2  . Insulin Syringe-Needle U-100 25G X 1" 1 ML MISC Use as directed 100 each 3  . losartan (COZAAR) 25 MG tablet Take 1 tablet by mouth once daily (Patient not taking: Reported on 02/10/2020) 90 tablet 1  . OVER THE COUNTER MEDICATION daily. Selinium 200 mg herbal supplement daily     No current facility-administered medications on file prior to visit.   Allergies  Allergen Reactions  . Codeine Itching   Social History  Socioeconomic History  . Marital status: Single    Spouse name: Not on file  . Number of children: Not on file  . Years of education: Not on file  . Highest education level: Not on file  Occupational History  . Not on file  Tobacco Use  . Smoking status: Former Smoker    Packs/day: 0.25    Types: Cigarettes    Quit date: 01/21/2018    Years since quitting: 2.0  . Smokeless tobacco: Never Used  . Tobacco comment: has been trying to quit  Substance and Sexual Activity  . Alcohol use: No    Alcohol/week: 0.0 standard drinks  . Drug use: No  . Sexual activity: Yes    Birth control/protection: None  Other Topics Concern  . Not on file  Social History Narrative  . Not on file   Social Determinants of Health   Financial Resource Strain: Not on file  Food Insecurity: Not on file  Transportation Needs: Not on file   Physical Activity: Not on file  Stress: Not on file  Social Connections: Not on file  Intimate Partner Violence: Not on file      Review of Systems  All other systems reviewed and are negative.      Objective:   Physical Exam        Assessment & Plan:  Type 2 diabetes mellitus with complication ([place]) - Plan: Hemoglobin A1c, CBC with Differential/Platelet, COMPLETE METABOLIC PANEL WITH GFR, Lipid panel, Microalbumin, urine  Hyperlipidemia, unspecified hyperlipidemia type  Benign essential HTN  Blood sugars sound well controlled.  I did asked the patient to come in fasting next week so that we can check her A1c.  Ideally I like to see her A1c below 7.  I would like to see her fasting lipid panel with her LDL cholesterol below 100.  Her blood pressure sounds well controlled.  Her COVID vaccines are up-to-date.  I would like her to get a flu shot at her earliest convenience.  She is also due for a diabetic foot exam however obviously we cannot conduct that over a telephone call today.  Regular anticipatory guidance is provided

## 2020-02-28 ENCOUNTER — Other Ambulatory Visit: Payer: Self-pay | Admitting: Family Medicine

## 2020-03-01 ENCOUNTER — Other Ambulatory Visit: Payer: Self-pay | Admitting: Family Medicine

## 2020-03-13 ENCOUNTER — Ambulatory Visit (INDEPENDENT_AMBULATORY_CARE_PROVIDER_SITE_OTHER): Payer: BC Managed Care – PPO | Admitting: Podiatry

## 2020-03-13 ENCOUNTER — Other Ambulatory Visit: Payer: Self-pay

## 2020-03-13 ENCOUNTER — Encounter: Payer: Self-pay | Admitting: Podiatry

## 2020-03-13 DIAGNOSIS — M79675 Pain in left toe(s): Secondary | ICD-10-CM

## 2020-03-13 DIAGNOSIS — E118 Type 2 diabetes mellitus with unspecified complications: Secondary | ICD-10-CM

## 2020-03-13 DIAGNOSIS — M79674 Pain in right toe(s): Secondary | ICD-10-CM

## 2020-03-13 DIAGNOSIS — L84 Corns and callosities: Secondary | ICD-10-CM | POA: Diagnosis not present

## 2020-03-13 DIAGNOSIS — B351 Tinea unguium: Secondary | ICD-10-CM | POA: Diagnosis not present

## 2020-03-20 NOTE — Progress Notes (Signed)
Subjective: Mckenzie Tyler presents today for follow up of preventative diabetic foot care and painful callus(es) b/l feet. Aggravating factors include weightbearing with and without shoe gear. Pain is relieved with periodic professional debridement.   She relates no new pedal concerns on today's visit.  PCP is Dr. Jenna Luo. Last visit was 02/10/2020.  Allergies  Allergen Reactions  . Codeine Itching    Objective: There were no vitals filed for this visit.  Pt is a pleasant 50 y.o. year old Caucasian female  in NAD. AAO x 3.   Vascular Examination:  Capillary refill time to digits immediate b/l. Palpable DP pulses b/l. Palpable PT pulses b/l. Pedal hair present b/l. Skin temperature gradient within normal limits b/l.  Dermatological Examination: Pedal skin with normal turgor, texture and tone bilaterally. No open wounds bilaterally. No interdigital macerations bilaterally. Toenails 1-5 b/l well maintained with adequate length. No erythema, no edema, no drainage, no flocculence. Hyperkeratotic lesion(s) L hallux, R hallux, submet head 1 left foot and submet head 1 right foot.  No erythema, no edema, no drainage, no flocculence.  Musculoskeletal: Normal muscle strength 5/5 to all lower extremity muscle groups bilaterally, no gross bony deformities bilaterally, no pain crepitus or joint limitation noted with ROM b/l and patient ambulates independent of any assistive aids  Neurological: Protective sensation intact 5/5 intact bilaterally with 10g monofilament b/l. Vibratory sensation intact b/l. Proprioception intact bilaterally. Babinski reflex negative b/l. Clonus negative b/l.  Assessment: 1. Pain due to onychomycosis of toenails of both feet   2. Callus   3. Diabetes mellitus type 2 with complications ([place])    Plan: -Continue diabetic foot care principles. Literature dispensed on today.  -Callus(es) L hallux, R hallux, submet head 1 left foot and submet head 1 right foot were  debrided without complication or incident. Total number debrided =4. -Patient to continue soft, supportive shoe gear daily. -Patient to report any pedal injuries to medical professional immediately. -Patient/POA to call should there be question/concern in the interim.  Return in about 3 months (around 06/10/2020) for diabetic callus trim.

## 2020-03-27 ENCOUNTER — Other Ambulatory Visit: Payer: Self-pay | Admitting: Family Medicine

## 2020-04-01 LAB — HM DIABETES EYE EXAM

## 2020-04-02 IMAGING — MG DIGITAL SCREENING BILAT W/ CAD
4 series · 4 of 4 positions shown · non-contrast
Comparison: Previous exam(s).

CLINICAL DATA: Screening.

EXAM:
DIGITAL SCREENING BILATERAL MAMMOGRAM WITH CAD

[L MLO]
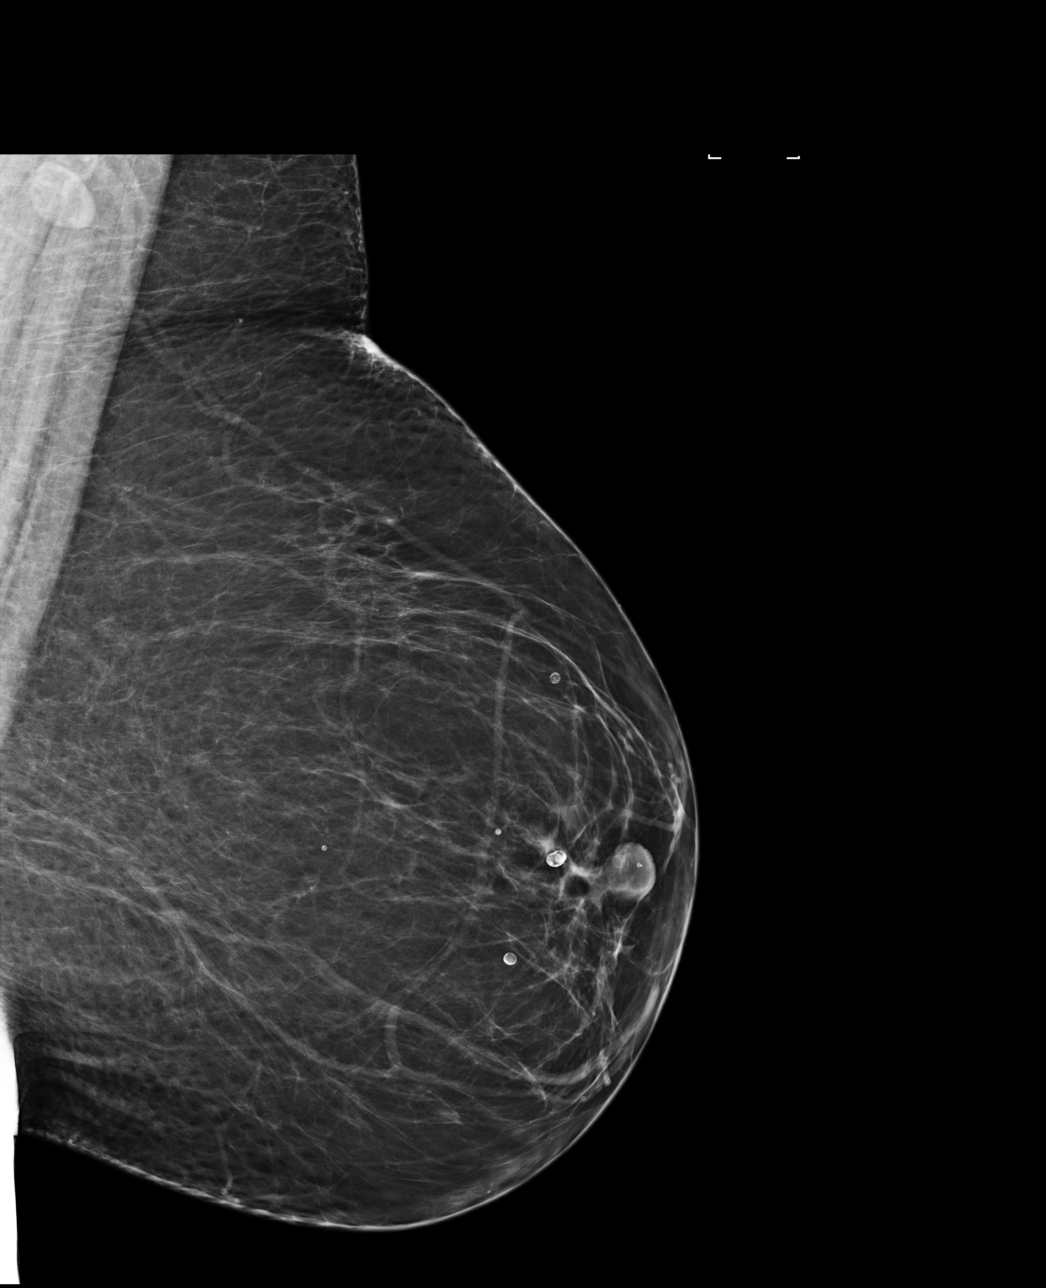

[R MLO]
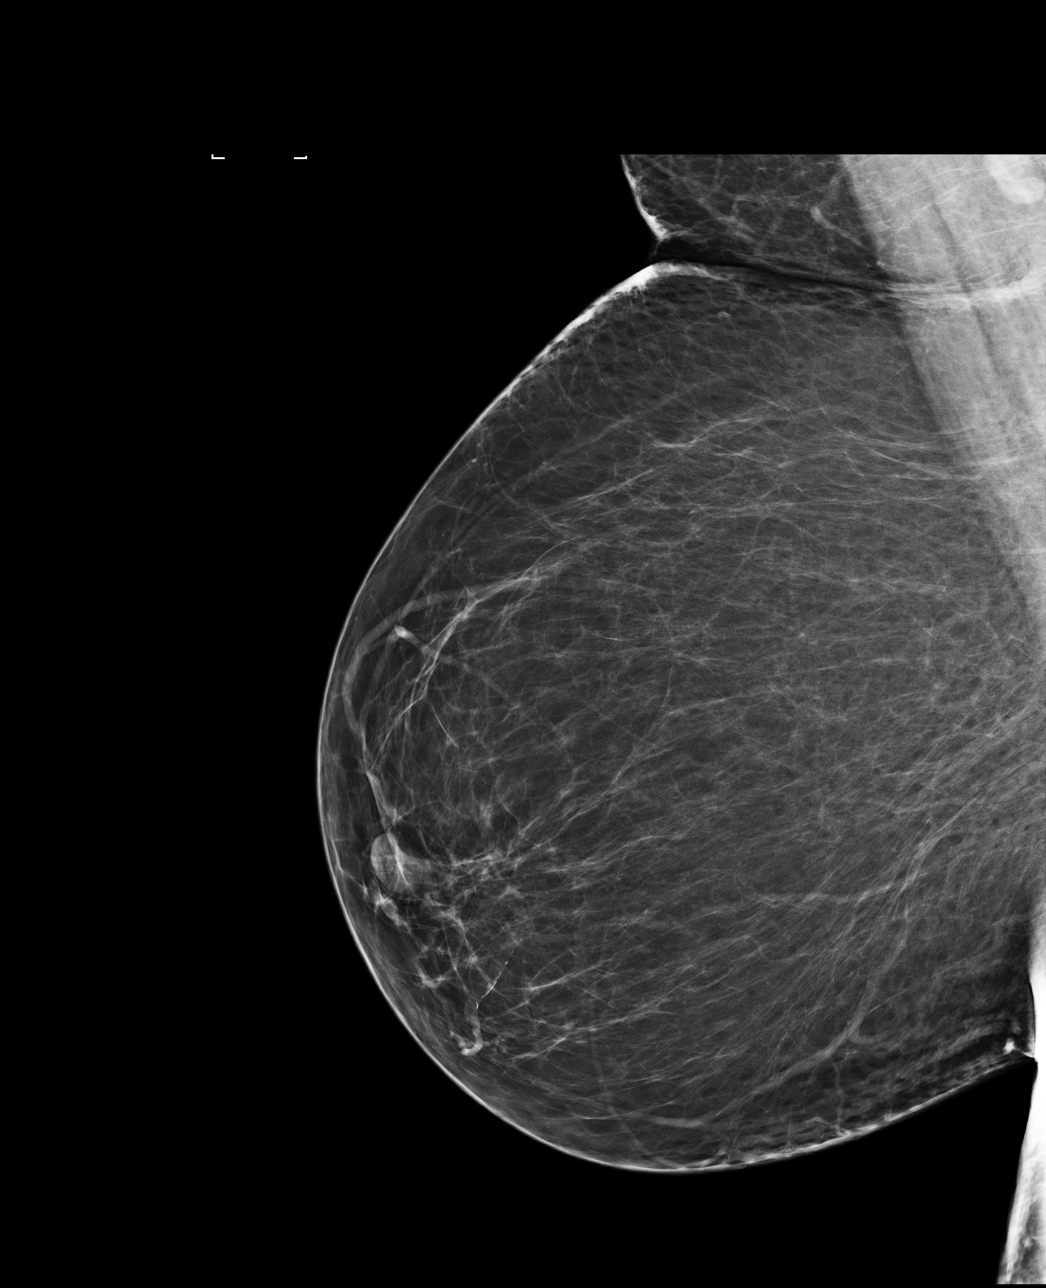

[L CC]
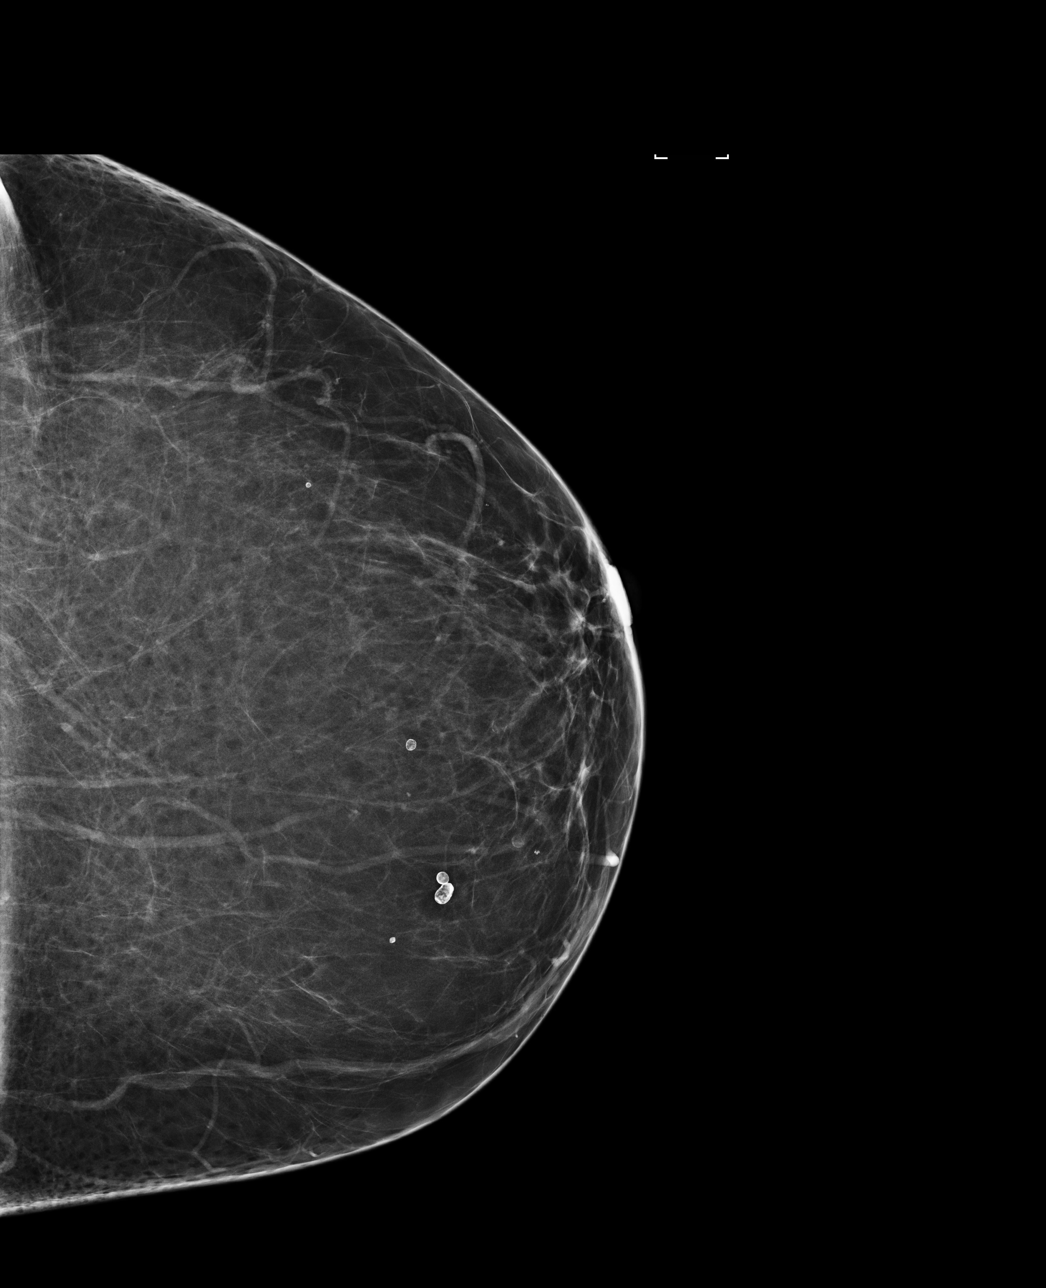

[R CC]
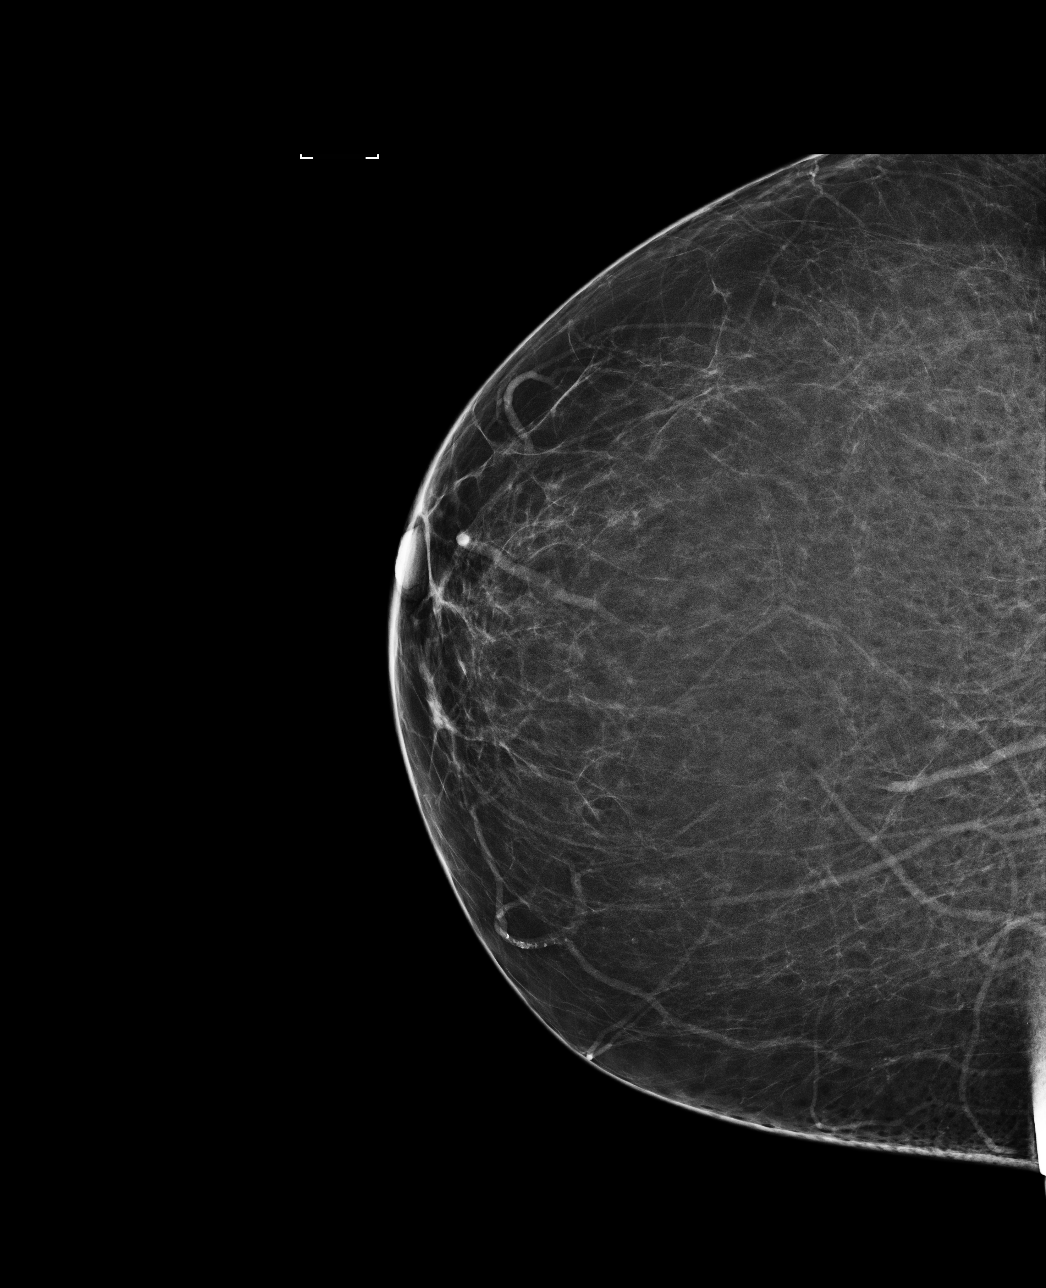

[4 of 4 positions shown; findings below may reference images not displayed]

ACR Breast Density Category b: There are scattered areas of
fibroglandular density.
FINDINGS: There are no findings suspicious for malignancy. Images were
processed with CAD.
IMPRESSION: No mammographic evidence of malignancy. A result letter of this
screening mammogram will be mailed directly to the patient.

RECOMMENDATION:
Screening mammogram in one year. (Code:AS-G-LCT)

BI-RADS CATEGORY  1: Negative.

## 2020-05-01 ENCOUNTER — Other Ambulatory Visit: Payer: Self-pay | Admitting: Family Medicine

## 2020-05-19 NOTE — Patient Instructions (Signed)
Please continue using your CPAP regularly. While your insurance requires that you use CPAP at least 4 hours each night on 70% of the nights, I recommend, that you not skip any nights and use it throughout the night if you can. Getting used to CPAP and staying with the treatment long term does take time and patience and discipline. Untreated obstructive sleep apnea when it is moderate to severe can have an adverse impact on cardiovascular health and raise her risk for heart disease, arrhythmias, hypertension, congestive heart failure, stroke and diabetes. Untreated obstructive sleep apnea causes sleep disruption, nonrestorative sleep, and sleep deprivation. This can have an impact on your day to day functioning and cause daytime sleepiness and impairment of cognitive function, memory loss, mood disturbance, and problems focussing. Using CPAP regularly can improve these symptoms.  Continue to monitor for leak at home. May consider a mask refitting if needed.   Follow up in 1 year   Sleep Apnea Sleep apnea affects breathing during sleep. It causes breathing to stop for a short time or to become shallow. It can also increase the risk of:  Heart attack.  Stroke.  Being very overweight (obese).  Diabetes.  Heart failure.  Irregular heartbeat. The goal of treatment is to help you breathe normally again. What are the causes? There are three kinds of sleep apnea:  Obstructive sleep apnea. This is caused by a blocked or collapsed airway.  Central sleep apnea. This happens when the brain does not send the right signals to the muscles that control breathing.  Mixed sleep apnea. This is a combination of obstructive and central sleep apnea. The most common cause of this condition is a collapsed or blocked airway. This can happen if:  Your throat muscles are too relaxed.  Your tongue and tonsils are too large.  You are overweight.  Your airway is too small.   What increases the  risk?  Being overweight.  Smoking.  Having a small airway.  Being older.  Being female.  Drinking alcohol.  Taking medicines to calm yourself (sedatives or tranquilizers).  Having family members with the condition. What are the signs or symptoms?  Trouble staying asleep.  Being sleepy or tired during the day.  Getting angry a lot.  Loud snoring.  Headaches in the morning.  Not being able to focus your mind (concentrate).  Forgetting things.  Less interest in sex.  Mood swings.  Personality changes.  Feelings of sadness (depression).  Waking up a lot during the night to pee (urinate).  Dry mouth.  Sore throat. How is this diagnosed?  Your medical history.  A physical exam.  A test that is done when you are sleeping (sleep study). The test is most often done in a sleep lab but may also be done at home. How is this treated?  Sleeping on your side.  Using a medicine to get rid of mucus in your nose (decongestant).  Avoiding the use of alcohol, medicines to help you relax, or certain pain medicines (narcotics).  Losing weight, if needed.  Changing your diet.  Not smoking.  Using a machine to open your airway while you sleep, such as: ? An oral appliance. This is a mouthpiece that shifts your lower jaw forward. ? A CPAP device. This device blows air through a mask when you breathe out (exhale). ? An EPAP device. This has valves that you put in each nostril. ? A BPAP device. This device blows air through a mask when you breathe  in (inhale) and breathe out.  Having surgery if other treatments do not work. It is important to get treatment for sleep apnea. Without treatment, it can lead to:  High blood pressure.  Coronary artery disease.  In men, not being able to have an erection (impotence).  Reduced thinking ability.   Follow these instructions at home: Lifestyle  Make changes that your doctor recommends.  Eat a healthy diet.  Lose  weight if needed.  Avoid alcohol, medicines to help you relax, and some pain medicines.  Do not use any products that contain nicotine or tobacco, such as cigarettes, e-cigarettes, and chewing tobacco. If you need help quitting, ask your doctor. General instructions  Take over-the-counter and prescription medicines only as told by your doctor.  If you were given a machine to use while you sleep, use it only as told by your doctor.  If you are having surgery, make sure to tell your doctor you have sleep apnea. You may need to bring your device with you.  Keep all follow-up visits as told by your doctor. This is important. Contact a doctor if:  The machine that you were given to use during sleep bothers you or does not seem to be working.  You do not get better.  You get worse. Get help right away if:  Your chest hurts.  You have trouble breathing in enough air.  You have an uncomfortable feeling in your back, arms, or stomach.  You have trouble talking.  One side of your body feels weak.  A part of your face is hanging down. These symptoms may be an emergency. Do not wait to see if the symptoms will go away. Get medical help right away. Call your local emergency services (911 in the [place] Do not drive yourself to the hospital. Summary  This condition affects breathing during sleep.  The most common cause is a collapsed or blocked airway.  The goal of treatment is to help you breathe normally while you sleep. This information is not intended to replace advice given to you by your health care provider. Make sure you discuss any questions you have with your health care provider. Document Revised: 11/03/2017 Document Reviewed: 09/12/2017 Elsevier Patient Education  [place].

## 2020-05-19 NOTE — Progress Notes (Addendum)
PATIENT: Mckenzie Tyler DOB: Jun 23, 1970  REASON FOR VISIT: follow up HISTORY FROM: patient  Chief Complaint  Patient presents with  . Obstructive Sleep Apnea    RM 1 alone Pt Is well, sleeps great. CPAP helps      HISTORY OF PRESENT ILLNESS: 05/20/20 ALL: She returns for follow up for OSA on CPAP. She feels that she is doing well on therapy. She does not get sleepy when driving. She feels better rested in the mornings. She has recently gotten new supplies and adjusted headgear and now feels that she is getting a better fit. She will continue to monitor for leak.       05/20/2019 ALL:  Mckenzie Tyler is a 50 y.o. female here today for follow up of OSA on CPAP. Sleep study in 10/2018 showed "moderate to severe obstructive sleep apnea, with a total AHI of 16.8/hour, REM AHI of 38.1/hour, supine AHI of 23.2/hour and O2 nadir of 77%". Titration study performed and BiPAP attempted, however, patient could not tolerate BiPAP. CPAP was started at set pressure of 15cmH20. She has done well with CPAP therapy at home. She did note increased energy and improved sleep quality after starting therpy. She does continue to have excessive fatigue. She is working on lifestyle changes and managing DM closely with PCP.  Compliance report dated 04/16/2019 through 05/15/2019 reveals that she has used CPAP 30 of the past 30 days for compliance of 100%.  She has used CPAP greater than 4 hours 30 of the past 30 days for compliance of 100%.  Average usage was 7 hours and 14 minutes.  Residual AHI was 3.4 on set pressure of 15 cm of water and an EPR of 2.  There was no significant leak noted.   HISTORY: (copied from Dr Guadelupe Sabin note on 10/18/2018)  Dear Dr. Dennard Schaumann,  I saw your patient, Mckenzie Tyler, upon your kind request to my sleep clinic today for initial consultation of her sleep disorder, in particular, concern for underlying obstructive sleep apnea.  The patient is unaccompanied today.  As you know, Ms.  Heiden is a 50 year old right-handed woman with an underlying medical history of diabetes, prior smoking, depression, hyperlipidemia and obesity, who reports snoring and excessive daytime somnolence, as well as recent difficulty with focus, reportedly she also had a recent abnormal overnight pulse oximetry test.  I reviewed your office note from 10/15/2018.  Her pulse oximetry was done in mid August, she did have desaturations into the 80s.  We will request test results from [facility] as I could not pull up the results in epic. Her Epworth sleepiness score is 16 out of 24, fatigue severity score is 42 out of 63.  She has been noted to make puffing sounds while asleep per sister.  Her younger sister has sleep apnea and uses a CPAP machine.  The patient is single and lives alone, no kids.  She has 1 dog who sleeps in the bedroom with her.  She has no TV in the bedroom.  She drinks caffeine and limitation, soda every other day, occasional tea, typically no coffee, tries to be in bed around 10 and rise time is 5 AM.  She has a 45-minute commute to [place].  She works from home some.  She works as a Ambulance person.  She is a non-smoker and quit almost a year ago after smoking for about 25 years.  She denies morning headaches but has nocturia about twice per average night.  She had  a tonsillectomy as a child.  She has had some weight fluctuations, some weight gain in the realm of 10 pounds in the past 6 months or so.   REVIEW OF SYSTEMS: Out of a complete 14 system review of symptoms, the patient complains only of the following symptoms, fatigue and all other reviewed systems are negative.  ESS: 12 FSS: 32  ALLERGIES: Allergies  Allergen Reactions  . Codeine Itching    HOME MEDICATIONS: Outpatient Medications Prior to Visit  Medication Sig Dispense Refill  . calcium carbonate (OS-CAL) 600 MG tablet Take 600 mg by mouth daily.    . Glucose Blood (BLOOD GLUCOSE TEST STRIPS) STRP Please  dispense as Relion test strips. Use as directed to monitor FSBS 3x daily. Dx: E11.65. 100 strip 1  . HUMALOG KWIKPEN 100 UNIT/ML KwikPen INJECT 15 TO 20 UNITS SUBCUTANEOUSLY THREE TIMES DAILY 15 mL 0  . Insulin Pen Needle (PEN NEEDLES) 32G X 5 MM MISC 1 Units by Does not apply route 3 (three) times daily. 100 each 2  . Insulin Syringe-Needle U-100 25G X 1" 1 ML MISC Use as directed 100 each 3  . JARDIANCE 25 MG TABS tablet TAKE 1 TABLET BY MOUTH ONCE DAILY BEFORE BREAKFAST 30 tablet 0  . LANTUS 100 UNIT/ML injection INJECT 60 TO 80 UNITS SUBCUTANEOUS AT BEDTIME 30 mL 0  . Magnesium 400 MG TABS Take by mouth.    . metFORMIN (GLUCOPHAGE) 1000 MG tablet TAKE 1 TABLET BY MOUTH TWICE DAILY WITH A MEAL 180 tablet 0  . Multiple Vitamin (MULTIVITAMIN) tablet Take 1 tablet by mouth daily.    Marland Kitchen OVER THE COUNTER MEDICATION daily. Selinium 200 mg herbal supplement daily    . POTASSIUM PO Take by mouth.    . simvastatin (ZOCOR) 20 MG tablet TAKE 1 TABLET BY MOUTH ONCE DAILY AT 6 PM. 90 tablet 2  . VICTOZA 18 MG/3ML SOPN INJECT 1.8 MG INTO THE SKIN DAILY. 9 mL 0  . losartan (COZAAR) 25 MG tablet Take 1 tablet by mouth once daily (Patient not taking: Reported on 02/10/2020) 90 tablet 1   No facility-administered medications prior to visit.    PAST MEDICAL HISTORY: Past Medical History:  Diagnosis Date  . Allergy   . Depression   . Diabetes mellitus type 2 with complications ([place])   . Diabetes mellitus without complication ([place])    Phreesia 01/12/2020  . Hyperlipidemia 02/13/2013  . Smoker     PAST SURGICAL HISTORY: Past Surgical History:  Procedure Laterality Date  . EYE SURGERY N/A    Phreesia 01/12/2020  . RETINOPATHY SURGERY  11/16/2017   due to diabetes   . TONSILLECTOMY AND ADENOIDECTOMY     age 46    FAMILY HISTORY: Family History  Problem Relation Age of Onset  . Diabetes Father   . Cancer Neg Hx   . Heart disease Neg Hx   . Colon cancer Neg Hx   . Colon polyps Neg Hx   . Rectal  cancer Neg Hx   . Stomach cancer Neg Hx     SOCIAL HISTORY: Social History   Socioeconomic History  . Marital status: Single    Spouse name: Not on file  . Number of children: Not on file  . Years of education: Not on file  . Highest education level: Not on file  Occupational History  . Not on file  Tobacco Use  . Smoking status: Former Smoker    Packs/day: 0.25    Types: Cigarettes  Quit date: 01/21/2018    Years since quitting: 2.3  . Smokeless tobacco: Never Used  . Tobacco comment: has been trying to quit  Substance and Sexual Activity  . Alcohol use: No    Alcohol/week: 0.0 standard drinks  . Drug use: No  . Sexual activity: Yes    Birth control/protection: None  Other Topics Concern  . Not on file  Social History Narrative  . Not on file   Social Determinants of Health   Financial Resource Strain: Not on file  Food Insecurity: Not on file  Transportation Needs: Not on file  Physical Activity: Not on file  Stress: Not on file  Social Connections: Not on file  Intimate Partner Violence: Not on file      PHYSICAL EXAM  Vitals:   05/20/20 0954  BP: 126/74  Pulse: 86  Weight: 227 lb (103 kg)  Height: 5\' 7"  (1.702 m)   Body mass index is 35.55 kg/m.  Generalized: Well developed, in no acute distress  Cardiology: normal rate and rhythm, no murmur noted Respiratory: clear to auscultation bilaterally  Neurological examination  Mentation: Alert oriented to time, place, history taking. Follows all commands speech and language fluent Cranial nerve II-XII: Pupils were equal round reactive to light. Extraocular movements were full, visual field were full  Motor: The motor testing reveals 5 over 5 strength of all 4 extremities. Good symmetric motor tone is noted throughout.   Gait and station: Gait is normal.   DIAGNOSTIC DATA (LABS, IMAGING, TESTING) - I reviewed patient records, labs, notes, testing and imaging myself where available.  No flowsheet  data found.   Lab Results  Component Value Date   WBC 6.1 10/21/2019   HGB 14.8 10/21/2019   HCT 44.6 10/21/2019   MCV 88.3 10/21/2019   PLT 223 10/21/2019      Component Value Date/Time   NA 138 10/21/2019 0910   K 3.9 10/21/2019 0910   CL 102 10/21/2019 0910   CO2 26 10/21/2019 0910   GLUCOSE 156 (H) 10/21/2019 0910   BUN 16 10/21/2019 0910   CREATININE 0.72 10/21/2019 0910   CALCIUM 9.7 10/21/2019 0910   PROT 6.9 10/21/2019 0910   ALBUMIN 3.9 01/21/2016 1207   AST 23 10/21/2019 0910   ALT 39 (H) 10/21/2019 0910   ALKPHOS 37 01/21/2016 1207   BILITOT 0.5 10/21/2019 0910   GFRNONAA 97 10/21/2019 0910   GFRAA 112 10/21/2019 0910   Lab Results  Component Value Date   CHOL 145 10/21/2019   HDL 31 (L) 10/21/2019   LDLCALC  10/21/2019     Comment:     . LDL cholesterol not calculated. Triglyceride levels greater than 400 mg/dL invalidate calculated LDL results. . Reference range: <100 . Desirable range <100 mg/dL for primary prevention;   <70 mg/dL for patients with CHD or diabetic patients  with > or = 2 CHD risk factors. Marland Kitchen LDL-C is now calculated using the Ticas-Hopkins  calculation, which is a validated novel method providing  better accuracy than the Friedewald equation in the  estimation of LDL-C.  Cresenciano Genre et al. Annamaria Helling. 1308;657(84): 2061-2068  (http://education.Quest[employer].com/faq/FAQ164)    TRIG 439 (H) 10/21/2019   CHOLHDL 4.7 10/21/2019   Lab Results  Component Value Date   HGBA1C 7.1 (H) 10/21/2019   Lab Results  Component Value Date   ONGEXBMW41 324 07/16/2019   Lab Results  Component Value Date   TSH 2.09 07/16/2019       ASSESSMENT AND PLAN 49  y.o. year old female  has a past medical history of Allergy, Depression, Diabetes mellitus type 2 with complications ([place]), Diabetes mellitus without complication ([place]), Hyperlipidemia (02/13/2013), and Smoker. here with     ICD-10-CM   1. OSA on CPAP  G47.33 For home use only DME  continuous positive airway pressure (CPAP)   Z99.89      Sharlotte is doing very well with CPAP therapy.  Compliance report reveals excellent compliance.  She was encouraged to continue using CPAP nightly and for greater than 4 hours each night. We have reviewed concerns of leak. She has recently changed supplies and adjusted headgear. She will continue to monitor at home. Healthy lifestyle habits were encouraged.  She will follow-up closely with primary care for comorbidity management.  She will follow-up with [place] in 1 year, sooner if needed for CPAP management.  She verbalizes understanding and agreement with this plan.   Orders Placed This Encounter  Procedures  . For home use only DME continuous positive airway pressure (CPAP)    Supplies    Order Specific Question:   Length of Need    Answer:   Lifetime    Order Specific Question:   Patient has OSA or probable OSA    Answer:   Yes    Order Specific Question:   Is the patient currently using CPAP in the home    Answer:   Yes    Order Specific Question:   Settings    Answer:   Other see comments    Order Specific Question:   CPAP supplies needed    Answer:   Mask, headgear, cushions, filters, heated tubing and water chamber     No orders of the defined types were placed in this encounter.     I spent 15 minutes with the patient. 50% of this time was spent counseling and educating patient on plan of care and medications.    Debbora Presto, FNP-C 05/20/2020, 10:18 AM [facility] [address], [place], [place] [address] [phone]  I reviewed the above note and documentation by the Nurse Practitioner and agree with the history, exam, assessment and plan as outlined above. I was available for consultation. Star Age, MD, PhD [facility])

## 2020-05-20 ENCOUNTER — Encounter: Payer: Self-pay | Admitting: Family Medicine

## 2020-05-20 ENCOUNTER — Ambulatory Visit (INDEPENDENT_AMBULATORY_CARE_PROVIDER_SITE_OTHER): Payer: BC Managed Care – PPO | Admitting: Family Medicine

## 2020-05-20 VITALS — BP 126/74 | HR 86 | Ht 67.0 in | Wt 227.0 lb

## 2020-05-20 DIAGNOSIS — Z9989 Dependence on other enabling machines and devices: Secondary | ICD-10-CM

## 2020-05-20 DIAGNOSIS — G4733 Obstructive sleep apnea (adult) (pediatric): Secondary | ICD-10-CM

## 2020-05-22 ENCOUNTER — Ambulatory Visit (INDEPENDENT_AMBULATORY_CARE_PROVIDER_SITE_OTHER): Payer: BC Managed Care – PPO | Admitting: Family Medicine

## 2020-05-22 ENCOUNTER — Other Ambulatory Visit: Payer: Self-pay

## 2020-05-22 ENCOUNTER — Encounter: Payer: Self-pay | Admitting: Family Medicine

## 2020-05-22 VITALS — BP 120/64 | HR 80 | Temp 98.1°F | Resp 14 | Ht 67.0 in | Wt 227.0 lb

## 2020-05-22 DIAGNOSIS — E785 Hyperlipidemia, unspecified: Secondary | ICD-10-CM | POA: Diagnosis not present

## 2020-05-22 DIAGNOSIS — I10 Essential (primary) hypertension: Secondary | ICD-10-CM

## 2020-05-22 DIAGNOSIS — Z1231 Encounter for screening mammogram for malignant neoplasm of breast: Secondary | ICD-10-CM

## 2020-05-22 DIAGNOSIS — E118 Type 2 diabetes mellitus with unspecified complications: Secondary | ICD-10-CM

## 2020-05-22 NOTE — Progress Notes (Signed)
Subjective:    Patient ID: Mckenzie Tyler, female    DOB: [birth year], 50 y.o.   MRN: [id number]  Patient is a very pleasant 50 year old Caucasian female with a history of type 2 diabetes mellitus.  She is currently using 60 to 70 units of Lantus every evening and about 10 units of Humalog 3 times a day with meals in addition to her other medications.  She states that her fasting blood sugars in the morning have been elevated.  She is seeing an average of around 170 in the morning.  The lowest reading that she seen in the morning has been 130.  She denies any hypoglycemic episodes.  She denies any polyuria, polydipsia, or blurry vision.  She is not been exercising as much as she knows that she should.  She is overdue for a Pap smear and a mammogram.  She would like to get the Pap smear at a later date however she does request me to schedule her for a mammogram.  She denies any chest pain shortness of breath or dyspnea on exertion.  Her blood pressure today is outstanding at 120/64.  Diabetic foot exam is normal. Past Medical History:  Diagnosis Date  . Allergy   . Depression   . Diabetes mellitus type 2 with complications ([place])   . Diabetes mellitus without complication ([place])    Phreesia 01/12/2020  . Hyperlipidemia 02/13/2013  . Smoker    Past Surgical History:  Procedure Laterality Date  . EYE SURGERY N/A    Phreesia 01/12/2020  . RETINOPATHY SURGERY  11/16/2017   due to diabetes   . TONSILLECTOMY AND ADENOIDECTOMY     age 67   Current Outpatient Medications on File Prior to Visit  Medication Sig Dispense Refill  . calcium carbonate (OS-CAL) 600 MG tablet Take 600 mg by mouth daily.    . Glucose Blood (BLOOD GLUCOSE TEST STRIPS) STRP Please dispense as Relion test strips. Use as directed to monitor FSBS 3x daily. Dx: E11.65. 100 strip 1  . HUMALOG KWIKPEN 100 UNIT/ML KwikPen INJECT 15 TO 20 UNITS SUBCUTANEOUSLY THREE TIMES DAILY 15 mL 0  . Insulin Pen Needle (PEN NEEDLES) 32G X 5 MM  MISC 1 Units by Does not apply route 3 (three) times daily. 100 each 2  . Insulin Syringe-Needle U-100 25G X 1" 1 ML MISC Use as directed 100 each 3  . JARDIANCE 25 MG TABS tablet TAKE 1 TABLET BY MOUTH ONCE DAILY BEFORE BREAKFAST 30 tablet 0  . LANTUS 100 UNIT/ML injection INJECT 60 TO 80 UNITS SUBCUTANEOUS AT BEDTIME 30 mL 0  . Magnesium 400 MG TABS Take by mouth.    . metFORMIN (GLUCOPHAGE) 1000 MG tablet TAKE 1 TABLET BY MOUTH TWICE DAILY WITH A MEAL 180 tablet 0  . Multiple Vitamin (MULTIVITAMIN) tablet Take 1 tablet by mouth daily.    Marland Kitchen OVER THE COUNTER MEDICATION daily. Selinium 200 mg herbal supplement daily    . POTASSIUM PO Take by mouth.    . simvastatin (ZOCOR) 20 MG tablet TAKE 1 TABLET BY MOUTH ONCE DAILY AT 6 PM. 90 tablet 2  . VICTOZA 18 MG/3ML SOPN INJECT 1.8 MG INTO THE SKIN DAILY. 9 mL 0   No current facility-administered medications on file prior to visit.   Allergies  Allergen Reactions  . Codeine Itching   Social History   Socioeconomic History  . Marital status: Single    Spouse name: Not on file  . Number of children: Not on file  .  Years of education: Not on file  . Highest education level: Not on file  Occupational History  . Not on file  Tobacco Use  . Smoking status: Former Smoker    Packs/day: 0.25    Types: Cigarettes    Quit date: 01/21/2018    Years since quitting: 2.3  . Smokeless tobacco: Never Used  . Tobacco comment: has been trying to quit  Substance and Sexual Activity  . Alcohol use: No    Alcohol/week: 0.0 standard drinks  . Drug use: No  . Sexual activity: Yes    Birth control/protection: None  Other Topics Concern  . Not on file  Social History Narrative  . Not on file   Social Determinants of Health   Financial Resource Strain: Not on file  Food Insecurity: Not on file  Transportation Needs: Not on file  Physical Activity: Not on file  Stress: Not on file  Social Connections: Not on file  Intimate Partner Violence: Not  on file      Review of Systems  All other systems reviewed and are negative.      Objective:   Physical Exam Vitals reviewed.  Constitutional:      General: She is not in acute distress.    Appearance: She is not ill-appearing.  Cardiovascular:     Rate and Rhythm: Normal rate and regular rhythm.     Pulses: Normal pulses.     Heart sounds: Normal heart sounds. No murmur heard. No gallop.   Pulmonary:     Effort: Pulmonary effort is normal. No respiratory distress.     Breath sounds: Normal breath sounds. No stridor. No wheezing, rhonchi or rales.  Chest:     Chest wall: No tenderness.  Abdominal:     General: Bowel sounds are normal. There is no distension.     Tenderness: There is no guarding or rebound.     Hernia: No hernia is present.  Musculoskeletal:     Right lower leg: No edema.     Left lower leg: No edema.  Neurological:     Mental Status: She is alert.        Assessment & Plan:  Type 2 diabetes mellitus with complication (HCC) - Plan: CBC with Differential/Platelet, COMPLETE METABOLIC PANEL WITH GFR, Lipid panel, Microalbumin, urine, Hemoglobin A1c  Hyperlipidemia, unspecified hyperlipidemia type  Benign essential HTN  Encounter for screening mammogram for malignant neoplasm of breast - Plan: MM Digital Screening  I will schedule the patient for her mammogram.  I recommended a Pap smear and she would like to do this later this year.  Check CBC, CMP, lipid panel, urine microalbumin, and A1c.  Goal A1c is less than 7.  Goal microalbumin to creatinine ratio is less than 30.  Blood pressure is at goal.  I recommended she increase Lantus to 80 units a day and continue to slowly gradually uptitrate basal insulin until fasting blood sugars are consistently near 130.  At the present time they are too high and I anticipate that her A1c will probably be near 8.  Also encouraged the patient to try to get more exercise as this would decrease her insulin requirements.

## 2020-05-23 LAB — COMPLETE METABOLIC PANEL WITH GFR
AG Ratio: 2 (calc) (ref 1.0–2.5)
ALT: 58 U/L — ABNORMAL HIGH (ref 6–29)
AST: 38 U/L — ABNORMAL HIGH (ref 10–35)
Albumin: 4.3 g/dL (ref 3.6–5.1)
Alkaline phosphatase (APISO): 41 U/L (ref 37–153)
BUN: 17 mg/dL (ref 7–25)
CO2: 26 mmol/L (ref 20–32)
Calcium: 9.3 mg/dL (ref 8.6–10.4)
Chloride: 104 mmol/L (ref 98–110)
Creat: 0.78 mg/dL (ref 0.50–1.05)
GFR, Est African American: 102 mL/min/{1.73_m2} (ref >=60)
GFR, Est Non African American: 88 mL/min/{1.73_m2} (ref >=60)
Globulin: 2.2 g/dL (calc) (ref 1.9–3.7)
Glucose, Bld: 162 mg/dL — ABNORMAL HIGH (ref 65–99)
Potassium: 4.2 mmol/L (ref 3.5–5.3)
Sodium: 141 mmol/L (ref 135–146)
Total Bilirubin: 0.4 mg/dL (ref 0.2–1.2)
Total Protein: 6.5 g/dL (ref 6.1–8.1)

## 2020-05-23 LAB — CBC WITH DIFFERENTIAL/PLATELET
Absolute Monocytes: 425 cells/uL (ref 200–950)
Basophils Absolute: 60 cells/uL (ref 0–200)
Basophils Relative: 1.2 %
Eosinophils Absolute: 350 cells/uL (ref 15–500)
Eosinophils Relative: 7 %
HCT: 45.6 % — ABNORMAL HIGH (ref 35.0–45.0)
Hemoglobin: 15.3 g/dL (ref 11.7–15.5)
Lymphs Abs: 1290 cells/uL (ref 850–3900)
MCH: 30.5 pg (ref 27.0–33.0)
MCHC: 33.6 g/dL (ref 32.0–36.0)
MCV: 90.8 fL (ref 80.0–100.0)
MPV: 10.2 fL (ref 7.5–12.5)
Monocytes Relative: 8.5 %
Neutro Abs: 2875 cells/uL (ref 1500–7800)
Neutrophils Relative %: 57.5 %
Platelets: 205 10*3/uL (ref 140–400)
RBC: 5.02 10*6/uL (ref 3.80–5.10)
RDW: 13.5 % (ref 11.0–15.0)
Total Lymphocyte: 25.8 %
WBC: 5 10*3/uL (ref 3.8–10.8)

## 2020-05-23 LAB — HEMOGLOBIN A1C
Hgb A1c MFr Bld: 7.4 % of total Hgb — ABNORMAL HIGH (ref <5.7)
Mean Plasma Glucose: 166 mg/dL
eAG (mmol/L): 9.2 mmol/L

## 2020-05-23 LAB — LIPID PANEL
Cholesterol: 140 mg/dL (ref <200)
HDL: 32 mg/dL — ABNORMAL LOW (ref >=50)
LDL Cholesterol (Calc): 72 mg/dL (calc)
Non-HDL Cholesterol (Calc): 108 mg/dL (calc) (ref <130)
Total CHOL/HDL Ratio: 4.4 (calc) (ref <5.0)
Triglycerides: 300 mg/dL — ABNORMAL HIGH (ref <150)

## 2020-05-23 LAB — MICROALBUMIN, URINE: Microalb, Ur: 1.3 mg/dL

## 2020-06-07 ENCOUNTER — Other Ambulatory Visit: Payer: Self-pay | Admitting: Family Medicine

## 2020-06-10 ENCOUNTER — Other Ambulatory Visit: Payer: Self-pay | Admitting: Family Medicine

## 2020-06-10 DIAGNOSIS — Z1231 Encounter for screening mammogram for malignant neoplasm of breast: Secondary | ICD-10-CM

## 2020-06-26 ENCOUNTER — Ambulatory Visit: Payer: BC Managed Care – PPO | Admitting: Podiatry

## 2020-06-26 ENCOUNTER — Other Ambulatory Visit: Payer: Self-pay | Admitting: *Deleted

## 2020-06-26 MED ORDER — INSULIN GLARGINE 100 UNIT/ML ~~LOC~~ SOLN: SUBCUTANEOUS | 6 refills | Status: DC

## 2020-07-05 ENCOUNTER — Other Ambulatory Visit: Payer: Self-pay | Admitting: Family Medicine

## 2020-07-28 ENCOUNTER — Other Ambulatory Visit: Payer: Self-pay | Admitting: Family Medicine

## 2020-08-31 ENCOUNTER — Other Ambulatory Visit: Payer: Self-pay

## 2020-08-31 ENCOUNTER — Ambulatory Visit
Admission: RE | Admit: 2020-08-31 | Discharge: 2020-08-31 | Disposition: A | Discharge status: Home / Self Care | Payer: BC Managed Care – PPO | Source: Ambulatory Visit

## 2020-08-31 DIAGNOSIS — Z1231 Encounter for screening mammogram for malignant neoplasm of breast: Secondary | ICD-10-CM

## 2020-09-05 ENCOUNTER — Other Ambulatory Visit: Payer: Self-pay | Admitting: Family Medicine

## 2020-09-12 ENCOUNTER — Other Ambulatory Visit: Payer: Self-pay | Admitting: Family Medicine

## 2020-10-03 ENCOUNTER — Other Ambulatory Visit: Payer: Self-pay | Admitting: Family Medicine

## 2020-11-02 ENCOUNTER — Other Ambulatory Visit: Payer: Self-pay | Admitting: Family Medicine

## 2020-11-06 DIAGNOSIS — H35371 Puckering of macula, right eye: Secondary | ICD-10-CM | POA: Diagnosis not present

## 2020-11-06 DIAGNOSIS — E113511 Type 2 diabetes mellitus with proliferative diabetic retinopathy with macular edema, right eye: Secondary | ICD-10-CM | POA: Diagnosis not present

## 2020-11-06 DIAGNOSIS — H43822 Vitreomacular adhesion, left eye: Secondary | ICD-10-CM | POA: Diagnosis not present

## 2020-11-06 DIAGNOSIS — E113592 Type 2 diabetes mellitus with proliferative diabetic retinopathy without macular edema, left eye: Secondary | ICD-10-CM | POA: Diagnosis not present

## 2020-11-29 ENCOUNTER — Other Ambulatory Visit: Payer: Self-pay | Admitting: Family Medicine

## 2020-12-15 ENCOUNTER — Encounter: Payer: Self-pay | Admitting: Family Medicine

## 2020-12-17 ENCOUNTER — Ambulatory Visit
Admission: RE | Admit: 2020-12-17 | Discharge: 2020-12-17 | Disposition: A | Discharge status: Home / Self Care | Payer: BC Managed Care – PPO | Source: Ambulatory Visit | Attending: Nurse Practitioner | Admitting: Nurse Practitioner

## 2020-12-17 ENCOUNTER — Ambulatory Visit (INDEPENDENT_AMBULATORY_CARE_PROVIDER_SITE_OTHER): Payer: BC Managed Care – PPO | Admitting: Nurse Practitioner

## 2020-12-17 ENCOUNTER — Other Ambulatory Visit: Payer: Self-pay

## 2020-12-17 ENCOUNTER — Encounter: Payer: Self-pay | Admitting: Nurse Practitioner

## 2020-12-17 VITALS — BP 144/90 | HR 75 | Temp 97.1°F | Ht 67.0 in | Wt 226.4 lb

## 2020-12-17 DIAGNOSIS — R202 Paresthesia of skin: Secondary | ICD-10-CM

## 2020-12-17 DIAGNOSIS — M79641 Pain in right hand: Secondary | ICD-10-CM | POA: Diagnosis not present

## 2020-12-17 DIAGNOSIS — M79642 Pain in left hand: Secondary | ICD-10-CM | POA: Diagnosis not present

## 2020-12-17 DIAGNOSIS — E118 Type 2 diabetes mellitus with unspecified complications: Secondary | ICD-10-CM

## 2020-12-17 NOTE — Progress Notes (Signed)
Subjective:    Patient ID: Mckenzie Tyler, female    DOB: 02-03-1970, 50 y.o.   MRN: [id number]  HPI: Mckenzie Tyler is a 50 y.o. female presenting for bilateral hand pain.  Chief Complaint  Patient presents with   Follow-up    Follow up hands numb tingling and hurt    HAND PAIN Patient reports she has had pain in both of her hands for years.  It has recently become unbearable. She thinks it is carpal tunnel syndrome.  She reports her hands feel tight and stiff first thing in the morning.  Towards the afternoon, the pain improves.  She does not do repetitive motions with her hands.  She has tried a wrist splint on the left hand with some relief.  Patient denies pain in other joints including hip, knee, ankles, feet. Using ice, heating pad, brace - helps.  Duration: years Involved hand: bilateral - left worse than right Mechanism of injury: unknown ; does not do repetitive  Location: palmar; middle Onset: worse in morning, gets better throughout the day Severity: 2/10 - 8/10  Quality: tingling, stiffness/tightness; hands feel asleep Frequency: daily Radiation: yes; up left arm  Aggravating factors: night time, using hands more  Alleviating factors: heat/ice, naproxen, wrist splint  Treatments attempted: heat/ice, naproxen, wrist splint  Relief with NSAIDs?: mild Weakness: no Numbness: yes Redness: no Swelling:yes Bruising: no Fevers: no  Allergies  Allergen Reactions   Codeine Itching    Outpatient Encounter Medications as of 12/17/2020  Medication Sig   calcium carbonate (OS-CAL) 600 MG tablet Take 600 mg by mouth daily.   fluticasone (FLONASE) 50 MCG/ACT nasal spray Place 1 spray into both nostrils 2 (two) times daily.   HUMALOG KWIKPEN 100 UNIT/ML KwikPen INJECT 15 TO 20 UNITS SUBCUTANEOUSLY THREE TIMES DAILY   insulin glargine (LANTUS) 100 UNIT/ML injection INJECT 60 TO 80 UNITS SUBCUTANEOUS AT BEDTIME   Insulin Pen Needle (PEN NEEDLES) 32G X 5 MM MISC 1  Units by Does not apply route 3 (three) times daily.   Insulin Syringe-Needle U-100 25G X 1" 1 ML MISC Use as directed   JARDIANCE 25 MG TABS tablet TAKE 1 TABLET BY MOUTH ONCE DAILY BEFORE BREAKFAST   Magnesium 400 MG TABS Take by mouth.   metFORMIN (GLUCOPHAGE) 1000 MG tablet TAKE 1 TABLET BY MOUTH TWICE DAILY WITH A MEAL   Multiple Vitamin (MULTIVITAMIN) tablet Take 1 tablet by mouth daily.   Omega-3 Fatty Acids (FISH OIL) 1000 MG CAPS Take 1,000 mg by mouth in the morning and at bedtime.   POTASSIUM PO Take by mouth.   RELION PRIME TEST test strip USE AS DIRECTED THREE TIMES DAILY TO MONITOR BLOOD SUGAR   simvastatin (ZOCOR) 20 MG tablet TAKE 1 TABLET BY MOUTH ONCE DAILY AT 6 PM   VICTOZA 18 MG/3ML SOPN INJECT 1.8MG  SUBCUTANEOUSLY ONCE DAILY   vitamin C (ASCORBIC ACID) 500 MG tablet Take 500 mg by mouth daily.   No facility-administered encounter medications on file as of 12/17/2020.    Patient Active Problem List   Diagnosis Date Noted   OSA on CPAP 05/20/2019   Diabetes mellitus type 2 with complications ([place])    Lateral epicondylitis of both elbows 10/01/2014   Carpal tunnel syndrome 10/01/2014   Hyperlipidemia 02/13/2013   Diabetes mellitus without complication [facility])     Past Medical History:  Diagnosis Date   Allergy    Depression    Diabetes mellitus type 2 with complications ([place])  Diabetes mellitus without complication ([place])    Phreesia 01/12/2020   Hyperlipidemia 02/13/2013   Smoker     Relevant past medical, surgical, family and social history reviewed and updated as indicated. Interim medical history since our last visit reviewed.  Review of Systems Per HPI unless specifically indicated above     Objective:    BP (!) 144/90   Pulse 75   Temp (!) 97.1 F (36.2 C)   Ht 5\' 7"  (1.702 m)   Wt 226 lb 6.4 oz (102.7 kg)   LMP 04/25/2013   SpO2 96%   BMI 35.46 kg/m   Wt Readings from Last 3 Encounters:  12/17/20 226 lb 6.4 oz (102.7 kg)  05/22/20 227 lb  (103 kg)  05/20/20 227 lb (103 kg)    Physical Exam Vitals and nursing note reviewed.  Constitutional:      General: She is not in acute distress.    Appearance: Normal appearance. She is not toxic-appearing.  Eyes:     General: No scleral icterus.    Extraocular Movements: Extraocular movements intact.  Musculoskeletal:        General: Swelling and tenderness present.     Right hand: Swelling present. Decreased range of motion. Normal sensation. Normal capillary refill. Normal pulse.     Left hand: Swelling present. Decreased range of motion. Normal sensation. Normal capillary refill. Normal pulse.     Comments: Swelling appreciated to bilateral PIP joints, DIP joints.  Patient with good strength, however not able to make a fist.  Positive Phalen's maneuver  Skin:    General: Skin is warm and dry.     Capillary Refill: Capillary refill takes less than 2 seconds.     Coloration: Skin is not jaundiced or pale.     Findings: No erythema.  Neurological:     Mental Status: She is alert and oriented to person, place, and time.     Motor: No weakness.     Gait: Gait normal.  Psychiatric:        Mood and Affect: Mood normal.        Behavior: Behavior normal.        Thought Content: Thought content normal.        Judgment: Judgment normal.      Assessment & Plan:  1. Pain in both hands Acutely worsening.  Given significant swelling to index joints, morning stiffness check rheumatologic panel.  We will also check bilateral hand x-rays.  Given positive Phalen test-refer to hand surgery.  Discussed use of braces every night in the meantime.   - ANA - Cyclic citrul peptide antibody, IgG - Rheumatoid factor - Sedimentation Rate - C-reactive protein - DG Hand Complete Left; Future - DG Hand Complete Right; Future - Ambulatory referral to Hand Surgery  2. Positive Phalen maneuver  - Ambulatory referral to Hand Surgery  3. Type 2 diabetes mellitus with complication ([place]) While in  office getting blood work drawn, patient requested blood work be drawn for diabetes.  I have encouraged her to schedule follow-up with her PCP to discuss the blood work.  - CBC with Differential/Platelet - COMPLETE METABOLIC PANEL WITH GFR - Hemoglobin A1c - Lipid Panel   Follow up plan: Return for follow up with pcp to discuss labs in ~2 weeks.

## 2020-12-22 LAB — ANTI-NUCLEAR AB-TITER (ANA TITER): ANA Titer 1: 1:40 {titer} — ABNORMAL HIGH

## 2020-12-22 LAB — COMPLETE METABOLIC PANEL WITH GFR
AG Ratio: 2.3 (calc) (ref 1.0–2.5)
ALT: 56 U/L — ABNORMAL HIGH (ref 6–29)
AST: 40 U/L — ABNORMAL HIGH (ref 10–35)
Albumin: 4.5 g/dL (ref 3.6–5.1)
Alkaline phosphatase (APISO): 39 U/L (ref 37–153)
BUN: 17 mg/dL (ref 7–25)
CO2: 24 mmol/L (ref 20–32)
Calcium: 9.1 mg/dL (ref 8.6–10.4)
Chloride: 103 mmol/L (ref 98–110)
Creat: 0.67 mg/dL (ref 0.50–1.03)
Globulin: 2 g/dL (calc) (ref 1.9–3.7)
Glucose, Bld: 102 mg/dL — ABNORMAL HIGH (ref 65–99)
Potassium: 4.2 mmol/L (ref 3.5–5.3)
Sodium: 139 mmol/L (ref 135–146)
Total Bilirubin: 0.7 mg/dL (ref 0.2–1.2)
Total Protein: 6.5 g/dL (ref 6.1–8.1)
eGFR: 105 mL/min/{1.73_m2} (ref >=60)

## 2020-12-22 LAB — C-REACTIVE PROTEIN: CRP: 1.6 mg/L (ref <8.0)

## 2020-12-22 LAB — LIPID PANEL
Cholesterol: 128 mg/dL (ref <200)
HDL: 36 mg/dL — ABNORMAL LOW (ref >=50)
LDL Cholesterol (Calc): 64 mg/dL (calc)
Non-HDL Cholesterol (Calc): 92 mg/dL (calc) (ref <130)
Total CHOL/HDL Ratio: 3.6 (calc) (ref <5.0)
Triglycerides: 215 mg/dL — ABNORMAL HIGH (ref <150)

## 2020-12-22 LAB — CBC WITH DIFFERENTIAL/PLATELET
Absolute Monocytes: 447 cells/uL (ref 200–950)
Basophils Absolute: 30 cells/uL (ref 0–200)
Basophils Relative: 0.7 %
Eosinophils Absolute: 288 cells/uL (ref 15–500)
Eosinophils Relative: 6.7 %
HCT: 44 % (ref 35.0–45.0)
Hemoglobin: 14.9 g/dL (ref 11.7–15.5)
Lymphs Abs: 1131 cells/uL (ref 850–3900)
MCH: 30.5 pg (ref 27.0–33.0)
MCHC: 33.9 g/dL (ref 32.0–36.0)
MCV: 90 fL (ref 80.0–100.0)
MPV: 10.2 fL (ref 7.5–12.5)
Monocytes Relative: 10.4 %
Neutro Abs: 2404 cells/uL (ref 1500–7800)
Neutrophils Relative %: 55.9 %
Platelets: 214 10*3/uL (ref 140–400)
RBC: 4.89 10*6/uL (ref 3.80–5.10)
RDW: 13.1 % (ref 11.0–15.0)
Total Lymphocyte: 26.3 %
WBC: 4.3 10*3/uL (ref 3.8–10.8)

## 2020-12-22 LAB — SEDIMENTATION RATE: Sed Rate: 2 mm/h (ref 0–30)

## 2020-12-22 LAB — HEMOGLOBIN A1C
Hgb A1c MFr Bld: 6.8 % of total Hgb — ABNORMAL HIGH (ref <5.7)
Mean Plasma Glucose: 148 mg/dL
eAG (mmol/L): 8.2 mmol/L

## 2020-12-22 LAB — CYCLIC CITRUL PEPTIDE ANTIBODY, IGG: Cyclic Citrullin Peptide Ab: <16 UNITS

## 2020-12-22 LAB — ANA: Anti Nuclear Antibody (ANA): POSITIVE — AB

## 2020-12-22 LAB — RHEUMATOID FACTOR: Rheumatoid fact SerPl-aCnc: <14 IU/mL (ref <14)

## 2020-12-25 ENCOUNTER — Other Ambulatory Visit: Payer: Self-pay | Admitting: Family Medicine

## 2020-12-29 DIAGNOSIS — M65331 Trigger finger, right middle finger: Secondary | ICD-10-CM | POA: Insufficient documentation

## 2020-12-29 DIAGNOSIS — M65321 Trigger finger, right index finger: Secondary | ICD-10-CM | POA: Diagnosis not present

## 2020-12-29 DIAGNOSIS — G5603 Carpal tunnel syndrome, bilateral upper limbs: Secondary | ICD-10-CM | POA: Diagnosis not present

## 2021-01-01 ENCOUNTER — Ambulatory Visit: Payer: BC Managed Care – PPO | Admitting: Family Medicine

## 2021-01-13 DIAGNOSIS — G5603 Carpal tunnel syndrome, bilateral upper limbs: Secondary | ICD-10-CM | POA: Diagnosis not present

## 2021-01-15 ENCOUNTER — Other Ambulatory Visit: Payer: Self-pay | Admitting: Family Medicine

## 2021-01-27 DIAGNOSIS — G5603 Carpal tunnel syndrome, bilateral upper limbs: Secondary | ICD-10-CM | POA: Diagnosis not present

## 2021-02-15 ENCOUNTER — Other Ambulatory Visit: Payer: Self-pay | Admitting: Family Medicine

## 2021-02-23 DIAGNOSIS — G5602 Carpal tunnel syndrome, left upper limb: Secondary | ICD-10-CM | POA: Diagnosis not present

## 2021-02-26 DIAGNOSIS — E113513 Type 2 diabetes mellitus with proliferative diabetic retinopathy with macular edema, bilateral: Secondary | ICD-10-CM | POA: Diagnosis not present

## 2021-02-26 DIAGNOSIS — H35371 Puckering of macula, right eye: Secondary | ICD-10-CM | POA: Diagnosis not present

## 2021-02-26 DIAGNOSIS — H43822 Vitreomacular adhesion, left eye: Secondary | ICD-10-CM | POA: Diagnosis not present

## 2021-02-26 DIAGNOSIS — H43812 Vitreous degeneration, left eye: Secondary | ICD-10-CM | POA: Diagnosis not present

## 2021-03-15 ENCOUNTER — Other Ambulatory Visit: Payer: Self-pay | Admitting: *Deleted

## 2021-03-15 ENCOUNTER — Other Ambulatory Visit: Payer: Self-pay | Admitting: Family Medicine

## 2021-03-15 MED ORDER — INSULIN GLARGINE 100 UNIT/ML ~~LOC~~ SOLN: SUBCUTANEOUS | 6 refills | Status: DC

## 2021-03-30 ENCOUNTER — Ambulatory Visit (INDEPENDENT_AMBULATORY_CARE_PROVIDER_SITE_OTHER): Payer: BC Managed Care – PPO | Admitting: Family Medicine

## 2021-03-30 ENCOUNTER — Encounter: Payer: Self-pay | Admitting: Family Medicine

## 2021-03-30 ENCOUNTER — Other Ambulatory Visit: Payer: Self-pay

## 2021-03-30 VITALS — BP 122/78 | HR 74 | Temp 97.0°F | Resp 18 | Ht 67.0 in | Wt 226.0 lb

## 2021-03-30 DIAGNOSIS — R0789 Other chest pain: Secondary | ICD-10-CM

## 2021-03-30 DIAGNOSIS — E118 Type 2 diabetes mellitus with unspecified complications: Secondary | ICD-10-CM | POA: Diagnosis not present

## 2021-03-30 DIAGNOSIS — E785 Hyperlipidemia, unspecified: Secondary | ICD-10-CM | POA: Diagnosis not present

## 2021-03-30 DIAGNOSIS — R079 Chest pain, unspecified: Secondary | ICD-10-CM

## 2021-03-30 DIAGNOSIS — I10 Essential (primary) hypertension: Secondary | ICD-10-CM

## 2021-03-30 NOTE — Progress Notes (Signed)
Subjective:    Patient ID: Mckenzie Tyler, female    DOB: 02-17-1970, 51 y.o.   MRN: [id number]  Patient is a very pleasant 51 year old Caucasian female with a history of type 2 diabetes mellitus.   Wt Readings from Last 3 Encounters:  03/30/21 226 lb (102.5 kg)  12/17/20 226 lb 6.4 oz (102.7 kg)  05/22/20 227 lb (103 kg)   She is currently on Lantus 70 units a day.  She had to decrease her insulin due to hypoglycemia around Christmas time.  However she is become more sedentary since and her sugars are now elevated.  Her sugars have been in the 150 range fasting in the morning.  They are near 200 with 2-hour postprandial sugars.  She is taking about 12 units of rapid acting insulin with meals.  She is still taking Jardiance and Victoza as well.  She denies any hypoglycemic episodes.  She does report atypical chest pain.  Is located just below the clavicle.  Is also to the left and right of the sternum.  I cannot reproduce it with palpation.  She denies any pleurisy or cough.  She does have a history of smoking.  She denies any exertional component.  She denies any nausea or vomiting.  She states that activity does not trigger the chest pain.  She denies any heartburn Past Medical History:  Diagnosis Date   Allergy    Depression    Diabetes mellitus type 2 with complications ([place])    Diabetes mellitus without complication ([place])    Phreesia 01/12/2020   Hyperlipidemia 02/13/2013   Smoker    Past Surgical History:  Procedure Laterality Date   EYE SURGERY N/A    Phreesia 01/12/2020   RETINOPATHY SURGERY  11/16/2017   due to diabetes    TONSILLECTOMY AND ADENOIDECTOMY     age 51   Current Outpatient Medications on File Prior to Visit  Medication Sig Dispense Refill   calcium carbonate (OS-CAL) 600 MG tablet Take 600 mg by mouth daily.     fluticasone (FLONASE) 50 MCG/ACT nasal spray Place 1 spray into both nostrils 2 (two) times daily.     HUMALOG KWIKPEN 100 UNIT/ML KwikPen INJECT 15  TO 20 UNITS SUBCUTANEOUSLY THREE TIMES DAILY 15 mL 0   insulin glargine (LANTUS) 100 UNIT/ML injection INJECT 60 TO 80 UNITS SUBCUTANEOUS AT BEDTIME 30 mL 6   Insulin Pen Needle (PEN NEEDLES) 32G X 5 MM MISC 1 Units by Does not apply route 3 (three) times daily. 100 each 2   Insulin Syringe-Needle U-100 25G X 1" 1 ML MISC Use as directed 100 each 3   JARDIANCE 25 MG TABS tablet TAKE 1 TABLET BY MOUTH ONCE DAILY BEFORE BREAKFAST 90 tablet 0   Magnesium 400 MG TABS Take by mouth.     metFORMIN (GLUCOPHAGE) 1000 MG tablet TAKE 1 TABLET BY MOUTH TWICE DAILY WITH A MEAL 180 tablet 0   Multiple Vitamin (MULTIVITAMIN) tablet Take 1 tablet by mouth daily.     Omega-3 Fatty Acids (FISH OIL) 1000 MG CAPS Take 1,000 mg by mouth in the morning and at bedtime.     POTASSIUM PO Take by mouth.     RELION PRIME TEST test strip USE AS DIRECTED THREE TIMES DAILY TO  MONITOR  BLOOD  SUGAR 100 each 6   simvastatin (ZOCOR) 20 MG tablet TAKE 1 TABLET BY MOUTH ONCE DAILY AT  6PM 90 tablet 0   VICTOZA 18 MG/3ML SOPN INJECT 1.8 MG SUBCUTANEOUSLY  ONCE DAILY 9 mL 0   vitamin C (ASCORBIC ACID) 500 MG tablet Take 500 mg by mouth daily.     No current facility-administered medications on file prior to visit.   Allergies  Allergen Reactions   Codeine Itching   Social History   Socioeconomic History   Marital status: Single    Spouse name: Not on file   Number of children: Not on file   Years of education: Not on file   Highest education level: Not on file  Occupational History   Not on file  Tobacco Use   Smoking status: Former    Packs/day: 0.25    Types: Cigarettes    Quit date: 01/21/2018    Years since quitting: 3.1   Smokeless tobacco: Never   Tobacco comments:    has been trying to quit  Substance and Sexual Activity   Alcohol use: No    Alcohol/week: 0.0 standard drinks   Drug use: No   Sexual activity: Yes    Birth control/protection: None  Other Topics Concern   Not on file  Social History  Narrative   Not on file   Social Determinants of Health   Financial Resource Strain: Not on file  Food Insecurity: Not on file  Transportation Needs: Not on file  Physical Activity: Not on file  Stress: Not on file  Social Connections: Not on file  Intimate Partner Violence: Not on file      Review of Systems  All other systems reviewed and are negative.     Objective:   Physical Exam Vitals reviewed.  Constitutional:      General: She is not in acute distress.    Appearance: She is not ill-appearing.  Cardiovascular:     Rate and Rhythm: Normal rate and regular rhythm.     Pulses: Normal pulses.     Heart sounds: Normal heart sounds. No murmur heard.   No gallop.  Pulmonary:     Effort: Pulmonary effort is normal. No respiratory distress.     Breath sounds: Normal breath sounds. No stridor. No wheezing, rhonchi or rales.  Chest:     Chest wall: No tenderness.  Abdominal:     General: Bowel sounds are normal. There is no distension.     Tenderness: There is no guarding or rebound.     Hernia: No hernia is present.  Musculoskeletal:     Right lower leg: No edema.     Left lower leg: No edema.  Neurological:     Mental Status: She is alert.       Assessment & Plan:  Type 2 diabetes mellitus with complication (HCC) - Plan: CBC with Differential/Platelet, Lipid panel, Microalbumin, urine, COMPLETE METABOLIC PANEL WITH GFR, Hemoglobin A1c, CT CORONARY MORPH W/CTA COR W/SCORE W/CA W/CM &/OR WO/CM  Hyperlipidemia, unspecified hyperlipidemia type - Plan: CT CORONARY MORPH W/CTA COR W/SCORE W/CA W/CM &/OR WO/CM  Benign essential HTN  Chest pain, unspecified type - Plan: CT CORONARY MORPH W/CTA COR W/SCORE W/CA W/CM &/OR WO/CM  Atypical chest pain - Plan: CT CORONARY MORPH W/CTA COR W/SCORE W/CA W/CM &/OR WO/CM Chest pain has been going on for quite some time.  It is atypical in nature.  However she has numerous risk factors including age, hypertension, diabetes, and  smoking.  Therefore I recommended a CT coronary calcium score.  If there is extensive lesions seen on the CT scan obviously the patient would benefit from seeing cardiology.  Blood pressure today is excellent.  I did recommend increasing Lantus back to 80 units a day until fasting blood sugars are under 130.  We also discussed switching Victoza to mounjaro but the patient is hesitant to make any change.  Check fasting lipid panel as well

## 2021-03-31 LAB — COMPLETE METABOLIC PANEL WITH GFR
AG Ratio: 1.8 (calc) (ref 1.0–2.5)
ALT: 55 U/L — ABNORMAL HIGH (ref 6–29)
AST: 39 U/L — ABNORMAL HIGH (ref 10–35)
Albumin: 4.4 g/dL (ref 3.6–5.1)
Alkaline phosphatase (APISO): 36 U/L — ABNORMAL LOW (ref 37–153)
BUN: 17 mg/dL (ref 7–25)
CO2: 28 mmol/L (ref 20–32)
Calcium: 9.4 mg/dL (ref 8.6–10.4)
Chloride: 103 mmol/L (ref 98–110)
Creat: 0.72 mg/dL (ref 0.50–1.03)
Globulin: 2.4 g/dL (calc) (ref 1.9–3.7)
Glucose, Bld: 166 mg/dL — ABNORMAL HIGH (ref 65–99)
Potassium: 4.4 mmol/L (ref 3.5–5.3)
Sodium: 141 mmol/L (ref 135–146)
Total Bilirubin: 0.5 mg/dL (ref 0.2–1.2)
Total Protein: 6.8 g/dL (ref 6.1–8.1)
eGFR: 101 mL/min/{1.73_m2} (ref >=60)

## 2021-03-31 LAB — CBC WITH DIFFERENTIAL/PLATELET
Absolute Monocytes: 535 cells/uL (ref 200–950)
Basophils Absolute: 50 cells/uL (ref 0–200)
Basophils Relative: 1 %
Eosinophils Absolute: 350 cells/uL (ref 15–500)
Eosinophils Relative: 7 %
HCT: 44.2 % (ref 35.0–45.0)
Hemoglobin: 15.2 g/dL (ref 11.7–15.5)
Lymphs Abs: 1195 cells/uL (ref 850–3900)
MCH: 31.1 pg (ref 27.0–33.0)
MCHC: 34.4 g/dL (ref 32.0–36.0)
MCV: 90.4 fL (ref 80.0–100.0)
MPV: 10.1 fL (ref 7.5–12.5)
Monocytes Relative: 10.7 %
Neutro Abs: 2870 cells/uL (ref 1500–7800)
Neutrophils Relative %: 57.4 %
Platelets: 187 10*3/uL (ref 140–400)
RBC: 4.89 10*6/uL (ref 3.80–5.10)
RDW: 13.5 % (ref 11.0–15.0)
Total Lymphocyte: 23.9 %
WBC: 5 10*3/uL (ref 3.8–10.8)

## 2021-03-31 LAB — LIPID PANEL
Cholesterol: 162 mg/dL (ref <200)
HDL: 36 mg/dL — ABNORMAL LOW (ref >=50)
LDL Cholesterol (Calc): 81 mg/dL (calc)
Non-HDL Cholesterol (Calc): 126 mg/dL (calc) (ref <130)
Total CHOL/HDL Ratio: 4.5 (calc) (ref <5.0)
Triglycerides: 377 mg/dL — ABNORMAL HIGH (ref <150)

## 2021-03-31 LAB — HEMOGLOBIN A1C
Hgb A1c MFr Bld: 6.7 % of total Hgb — ABNORMAL HIGH (ref <5.7)
Mean Plasma Glucose: 146 mg/dL
eAG (mmol/L): 8.1 mmol/L

## 2021-03-31 LAB — MICROALBUMIN, URINE: Microalb, Ur: 1 mg/dL

## 2021-04-01 DIAGNOSIS — G4733 Obstructive sleep apnea (adult) (pediatric): Secondary | ICD-10-CM | POA: Diagnosis not present

## 2021-04-05 ENCOUNTER — Other Ambulatory Visit: Payer: Self-pay

## 2021-04-05 ENCOUNTER — Ambulatory Visit (HOSPITAL_COMMUNITY)
Admission: RE | Admit: 2021-04-05 | Discharge: 2021-04-05 | Disposition: A | Discharge status: Home / Self Care | Payer: BC Managed Care – PPO | Source: Ambulatory Visit | Attending: Family Medicine | Admitting: Family Medicine

## 2021-04-05 DIAGNOSIS — R079 Chest pain, unspecified: Secondary | ICD-10-CM | POA: Insufficient documentation

## 2021-04-05 DIAGNOSIS — R0789 Other chest pain: Secondary | ICD-10-CM | POA: Insufficient documentation

## 2021-04-05 DIAGNOSIS — E118 Type 2 diabetes mellitus with unspecified complications: Secondary | ICD-10-CM | POA: Insufficient documentation

## 2021-04-05 DIAGNOSIS — E785 Hyperlipidemia, unspecified: Secondary | ICD-10-CM | POA: Insufficient documentation

## 2021-04-06 DIAGNOSIS — M65331 Trigger finger, right middle finger: Secondary | ICD-10-CM | POA: Diagnosis not present

## 2021-04-06 DIAGNOSIS — M65321 Trigger finger, right index finger: Secondary | ICD-10-CM | POA: Diagnosis not present

## 2021-04-06 DIAGNOSIS — G5601 Carpal tunnel syndrome, right upper limb: Secondary | ICD-10-CM | POA: Diagnosis not present

## 2021-04-07 ENCOUNTER — Other Ambulatory Visit: Payer: Self-pay | Admitting: Family Medicine

## 2021-04-07 DIAGNOSIS — R931 Abnormal findings on diagnostic imaging of heart and coronary circulation: Secondary | ICD-10-CM

## 2021-04-08 ENCOUNTER — Other Ambulatory Visit: Payer: Self-pay | Admitting: Family Medicine

## 2021-04-12 DIAGNOSIS — M25641 Stiffness of right hand, not elsewhere classified: Secondary | ICD-10-CM | POA: Diagnosis not present

## 2021-04-19 NOTE — Progress Notes (Deleted)
?Cardiology Office Note:   ? ?Date:  04/19/2021  ? ?ID:  Mckenzie Tyler, DOB 08/21/1970, MRN [id number] ? ?PCP:  [name], MD ?  ?[facility] Providers ?Cardiologist:  None { ? ?Referring MD: [name], MD  ? ? ?History of Present Illness:   ? ?Mckenzie Tyler is a 51 y.o. female with a hx of OSA on CPAP, DMII, and HLD who was referred by Dr. Dennard Schaumann for further evaluation of elevated Ca score. ? ?Ca score 622. LAD 275, RCA 347. 99% for age, gender, race matched controls. ? ?Today, *** ? ?Past Medical History:  ?Diagnosis Date  ? Allergy   ? Depression   ? Diabetes mellitus type 2 with complications ([place])   ? Diabetes mellitus without complication ([place])   ? Phreesia 01/12/2020  ? Hyperlipidemia 02/13/2013  ? Smoker   ? ? ?Past Surgical History:  ?Procedure Laterality Date  ? EYE SURGERY N/A   ? Phreesia 01/12/2020  ? RETINOPATHY SURGERY  11/16/2017  ? due to diabetes   ? TONSILLECTOMY AND ADENOIDECTOMY    ? age 66  ? ? ?Current Medications: ?No outpatient medications have been marked as taking for the 04/22/21 encounter (Appointment) with Freada Bergeron, MD.  ?  ? ?Allergies:   Codeine  ? ?Social History  ? ?Socioeconomic History  ? Marital status: Single  ?  Spouse name: Not on file  ? Number of children: Not on file  ? Years of education: Not on file  ? Highest education level: Not on file  ?Occupational History  ? Not on file  ?Tobacco Use  ? Smoking status: Former  ?  Packs/day: 0.25  ?  Types: Cigarettes  ?  Quit date: 01/21/2018  ?  Years since quitting: 3.2  ? Smokeless tobacco: Never  ? Tobacco comments:  ?  has been trying to quit  ?Substance and Sexual Activity  ? Alcohol use: No  ?  Alcohol/week: 0.0 standard drinks  ? Drug use: No  ? Sexual activity: Yes  ?  Birth control/protection: None  ?Other Topics Concern  ? Not on file  ?Social History Narrative  ? Not on file  ? ?Social Determinants of Health  ? ?Financial Resource Strain: Not on file  ?Food Insecurity: Not on file   ?Transportation Needs: Not on file  ?Physical Activity: Not on file  ?Stress: Not on file  ?Social Connections: Not on file  ?  ? ?Family History: ?The patient's ***family history includes Diabetes in her father. There is no history of Cancer, Heart disease, Colon cancer, Colon polyps, Rectal cancer, or Stomach cancer. ? ?ROS:   ?Please see the history of present illness.    ?*** All other systems reviewed and are negative. ? ?EKGs/Labs/Other Studies Reviewed:   ? ?The following studies were reviewed today: ?Ca Score 03/2021: ?FINDINGS: ?Coronary arteries: Normal origins. ?  ?Coronary Calcium Score: ?  ?Left main: 0 ?  ?Left anterior descending artery: 275 ?  ?Left circumflex artery: 0 ?  ?Right coronary artery: 347 ?  ?Total: 622 ?  ?Percentile: 99th ?  ?Pericardium: Normal. ?  ?Aorta: Normal caliber.  Atherosclerosis. ?  ?Heavy mitral annular calcification. ?  ?Non-cardiac: See separate report from [facility]. ?  ?IMPRESSION: ?1. Coronary calcium score of 622. This was 99th percentile for age-, ?race-, and sex-matched controls. ?2. Aortic atherosclerosis. ?3. Heavy mitral annular calcification. ?4. Cardiology evaluation is recommended. ?  ? ?EKG:  EKG is *** ordered today.  The ekg  ordered today demonstrates *** ? ?Recent Labs: ?03/30/2021: ALT 55; BUN 17; Creat 0.72; Hemoglobin 15.2; Platelets 187; Potassium 4.4; Sodium 141  ?Recent Lipid Panel ?   ?Component Value Date/Time  ? CHOL 162 03/30/2021 0815  ? TRIG 377 (H) 03/30/2021 0815  ? HDL 36 (L) 03/30/2021 0815  ? CHOLHDL 4.5 03/30/2021 0815  ? VLDL 44 (H) 01/21/2016 1207  ? [place] 81 03/30/2021 0815  ? ? ? ?Risk Assessment/Calculations:   ?{Does this patient have ATRIAL FIBRILLATION?[phone]} ? ?    ? ?Physical Exam:   ? ?VS:  LMP 04/25/2013    ? ?Wt Readings from Last 3 Encounters:  ?03/30/21 226 lb (102.5 kg)  ?12/17/20 226 lb 6.4 oz (102.7 kg)  ?05/22/20 227 lb (103 kg)  ?  ? ?GEN: *** Well nourished, well developed in no acute  distress ?HEENT: Normal ?NECK: No JVD; No carotid bruits ?LYMPHATICS: No lymphadenopathy ?CARDIAC: ***RRR, no murmurs, rubs, gallops ?RESPIRATORY:  Clear to auscultation without rales, wheezing or rhonchi  ?ABDOMEN: Soft, non-tender, non-distended ?MUSCULOSKELETAL:  No edema; No deformity  ?SKIN: Warm and dry ?NEUROLOGIC:  Alert and oriented x 3 ?PSYCHIATRIC:  Normal affect  ? ?ASSESSMENT:   ? ?No diagnosis found. ?PLAN:   ? ?In order of problems listed above: ? ?#CAD: ?Ca score 622 which is the 99% for age, gender, race matched controls.  ?-Continue simvastatin '20mg'$  daily ?-Start ASA '81mg'$  daily ? ?#HLD: ?-Continue simvastatin '20mg'$  daily ? ?#DMII on Insulin: ?-Continue insulin ?-Continue jardiance ?-Continue metformin ? ?   ? ?{Are you ordering a CV Procedure (e.g. stress test, cath, DCCV, TEE, etc)?   Press F2        :[id number]}  ? ? ?Medication Adjustments/Labs and Tests Ordered: ?Current medicines are reviewed at length with the patient today.  Concerns regarding medicines are outlined above.  ?No orders of the defined types were placed in this encounter. ? ?No orders of the defined types were placed in this encounter. ? ? ?There are no Patient Instructions on file for this visit.  ? ?Signed, ?Freada Bergeron, MD  ?04/19/2021 8:09 PM    ?[place] ?

## 2021-04-21 DIAGNOSIS — M25641 Stiffness of right hand, not elsewhere classified: Secondary | ICD-10-CM | POA: Diagnosis not present

## 2021-04-21 NOTE — Progress Notes (Signed)
?Cardiology Office Note:   ? ?Date:  04/22/2021  ? ?ID:  Mckenzie Tyler, DOB [birth year], MRN [id number] ? ?PCP:  [name], MD ?  ?[facility] Providers ?Cardiologist:  None { ? ?Referring MD: [name], MD  ? ? ?History of Present Illness:   ? ?Mckenzie Tyler is a 51 y.o. female with a hx of OSA on CPAP, DMII, and HLD who was referred by Dr. Dennard Schaumann for further evaluation of elevated Ca score. ? ?Ca score 622. LAD 275, RCA 347. 99% for age, gender, race matched controls. ? ?Today, she is doing well. She becomes short of breath after mild activity which she associates with her smoking history. She is able to walk up a flight of stairs. She denies chest pressure, dyspnea at rest, palpitations, PND, orthopnea, or leg swelling. ? ?She endorses a constant chest pain spreading across her chest. The pain is not aggravated with exertion and is not relieved by rest. Symptoms occur all day long but can be worse at certain times. No associated symptoms.  ? ?She is a Pharmacist, [employer]. She admits to not being active and having a poor diet. Recently, she was started on fish oil. Currently, she does not take an aspirin regularly. Additionally, she takes insulin for diabetes type 2. 3 years ago, she quit smoking after 30 years.   ? ?Past Medical History:  ?Diagnosis Date  ? Allergy   ? Depression   ? Diabetes mellitus type 2 with complications ([place])   ? Diabetes mellitus without complication ([place])   ? Phreesia 01/12/2020  ? Hyperlipidemia 02/13/2013  ? Smoker   ? ? ?Past Surgical History:  ?Procedure Laterality Date  ? EYE SURGERY N/A   ? Phreesia 01/12/2020  ? RETINOPATHY SURGERY  11/16/2017  ? due to diabetes   ? TONSILLECTOMY AND ADENOIDECTOMY    ? age 48  ? ? ?Current Medications: ?Current Meds  ?Medication Sig  ? aspirin EC 81 MG tablet Take 1 tablet (81 mg total) by mouth daily. Swallow whole.  ? calcium carbonate (OS-CAL) 600 MG tablet Take 600 mg by mouth daily.  ? fluticasone (FLONASE) 50  MCG/ACT nasal spray Place 1 spray into both nostrils 2 (two) times daily.  ? HUMALOG KWIKPEN 100 UNIT/ML KwikPen INJECT 15 TO 20 UNITS SUBCUTANEOUSLY THREE TIMES DAILY  ? icosapent Ethyl (VASCEPA) 1 g capsule Take 2 capsules (2 g total) by mouth 2 (two) times daily.  ? insulin glargine (LANTUS) 100 UNIT/ML injection INJECT 60 TO 80 UNITS SUBCUTANEOUS AT BEDTIME  ? Insulin Pen Needle (PEN NEEDLES) 32G X 5 MM MISC 1 Units by Does not apply route 3 (three) times daily.  ? Insulin Syringe-Needle U-100 25G X 1" 1 ML MISC Use as directed  ? JARDIANCE 25 MG TABS tablet TAKE 1 TABLET BY MOUTH ONCE DAILY BEFORE BREAKFAST  ? Magnesium 400 MG TABS Take by mouth.  ? metFORMIN (GLUCOPHAGE) 1000 MG tablet TAKE 1 TABLET BY MOUTH TWICE DAILY WITH A MEAL  ? Multiple Vitamin (MULTIVITAMIN) tablet Take 1 tablet by mouth daily.  ? Omega-3 Fatty Acids (FISH OIL) 1000 MG CAPS Take 1,000 mg by mouth in the morning and at bedtime.  ? POTASSIUM PO Take by mouth.  ? RELION PRIME TEST test strip USE AS DIRECTED THREE TIMES DAILY TO  MONITOR  BLOOD  SUGAR  ? rosuvastatin (CRESTOR) 20 MG tablet Take 1 tablet (20 mg total) by mouth daily.  ? VICTOZA 18 MG/3ML SOPN INJECT 1.8  MG SUBCUTANEOUSLY ONCE DAILY  ? vitamin C (ASCORBIC ACID) 500 MG tablet Take 500 mg by mouth daily.  ? [DISCONTINUED] simvastatin (ZOCOR) 20 MG tablet TAKE 1 TABLET BY MOUTH ONCE DAILY AT  6PM  ?  ? ?Allergies:   Codeine  ? ?Social History  ? ?Socioeconomic History  ? Marital status: Single  ?  Spouse name: Not on file  ? Number of children: Not on file  ? Years of education: Not on file  ? Highest education level: Not on file  ?Occupational History  ? Not on file  ?Tobacco Use  ? Smoking status: Former  ?  Packs/day: 0.25  ?  Types: Cigarettes  ?  Quit date: 01/21/2018  ?  Years since quitting: 3.2  ? Smokeless tobacco: Never  ? Tobacco comments:  ?  has been trying to quit  ?Substance and Sexual Activity  ? Alcohol use: No  ?  Alcohol/week: 0.0 standard drinks  ? Drug use:  No  ? Sexual activity: Yes  ?  Birth control/protection: None  ?Other Topics Concern  ? Not on file  ?Social History Narrative  ? Not on file  ? ?Social Determinants of Health  ? ?Financial Resource Strain: Not on file  ?Food Insecurity: Not on file  ?Transportation Needs: Not on file  ?Physical Activity: Not on file  ?Stress: Not on file  ?Social Connections: Not on file  ?  ? ?Family History: ?The patient's family history includes Diabetes in her father. There is no history of Cancer, Heart disease, Colon cancer, Colon polyps, Rectal cancer, or Stomach cancer. ? ?ROS:   ?Please see the history of present illness.    ?Review of Systems  ?Constitutional:  Negative for malaise/fatigue and weight loss.  ?HENT:  Negative for congestion and sore throat.   ?Eyes:  Negative for blurred vision.  ?Respiratory:  Positive for shortness of breath (exertional). Negative for cough.   ?Cardiovascular:  Positive for chest pain (central chest). Negative for palpitations, orthopnea, claudication, leg swelling and PND.  ?Gastrointestinal:  Negative for heartburn and nausea.  ?Genitourinary:  Negative for dysuria and urgency.  ?Musculoskeletal:  Negative for myalgias.  ?Skin:  Negative for itching and rash.  ?Neurological:  Negative for dizziness and headaches.  ?Endo/Heme/Allergies:  Does not bruise/bleed easily.  ?Psychiatric/Behavioral:  The patient is not nervous/anxious and does not have insomnia.   ?All other systems reviewed and are negative. ? ?EKGs/Labs/Other Studies Reviewed:   ? ?The following studies were reviewed today: ?Ca Score 03/2021: ?FINDINGS: ?Coronary arteries: Normal origins. ?  ?Coronary Calcium Score: ?  ?Left main: 0 ?  ?Left anterior descending artery: 275 ?  ?Left circumflex artery: 0 ?  ?Right coronary artery: 347 ?  ?Total: 622 ?  ?Percentile: 99th ?  ?Pericardium: Normal. ?  ?Aorta: Normal caliber.  Atherosclerosis. ?  ?Heavy mitral annular calcification. ?  ?Non-cardiac: See separate report from  [facility]. ?  ?IMPRESSION: ?1. Coronary calcium score of 622. This was 99th percentile for age-, ?race-, and sex-matched controls. ?2. Aortic atherosclerosis. ?3. Heavy mitral annular calcification. ?4. Cardiology evaluation is recommended. ?  ? ?EKG:  ?04/22/21: NSR, HR 87 bpm ? ?Recent Labs: ?03/30/2021: ALT 55; BUN 17; Creat 0.72; Hemoglobin 15.2; Platelets 187; Potassium 4.4; Sodium 141  ?Recent Lipid Panel ?   ?Component Value Date/Time  ? CHOL 162 03/30/2021 0815  ? TRIG 377 (H) 03/30/2021 0815  ? HDL 36 (L) 03/30/2021 0815  ? CHOLHDL 4.5 03/30/2021 0815  ? VLDL 44 (H)  01/21/2016 1207  ? [place] 81 03/30/2021 0815  ? ? ? ?Risk Assessment/Calculations:   ?  ? ?    ? ?Physical Exam:   ? ?VS:  BP 126/68   Pulse 87   Ht '5\' 7"'$  (1.702 m)   Wt 228 lb (103.4 kg)   LMP 04/25/2013   SpO2 95%   BMI 35.71 kg/m?    ? ?Wt Readings from Last 3 Encounters:  ?04/22/21 228 lb (103.4 kg)  ?03/30/21 226 lb (102.5 kg)  ?12/17/20 226 lb 6.4 oz (102.7 kg)  ?  ? ?GEN: Well nourished, well developed in no acute distress ?HEENT: Normal ?NECK: No JVD; No carotid bruits ?LYMPHATICS: No lymphadenopathy ?CARDIAC: RRR, no murmurs, rubs, gallops ?RESPIRATORY:  Clear to auscultation without rales, wheezing or rhonchi  ?ABDOMEN: Soft, non-tender, non-distended ?MUSCULOSKELETAL:  No edema; No deformity  ?SKIN: Warm and dry ?NEUROLOGIC:  Alert and oriented x 3 ?PSYCHIATRIC:  Normal affect  ? ?ASSESSMENT:   ? ?1. Coronary artery disease involving native coronary artery of native heart without angina pectoris   ?2. Chest pain of uncertain etiology   ?3. Hyperlipidemia, unspecified hyperlipidemia type   ?4. Medication management   ?5. Type 2 diabetes mellitus with hyperlipidemia ([place])   ?6. Obesity (BMI 35.0-39.9 without comorbidity)   ? ?PLAN:   ? ?In order of problems listed above: ? ?#CAD: ?Ca score 622 which is the 99% for age, gender, race matched controls. No anginal symptoms and do not suspect her constant, nonexertional chest  pain is related to coronary disease. Will plan for aggressive risk factor modification.  ?-Start ASA '81mg'$  daily ?-Change simva to crestor '20mg'$  daily ?-Start vascepa 2g BID ?-Lifestyle modifications as detailed belo

## 2021-04-22 ENCOUNTER — Encounter: Payer: Self-pay | Admitting: Cardiology

## 2021-04-22 ENCOUNTER — Other Ambulatory Visit: Payer: Self-pay

## 2021-04-22 ENCOUNTER — Ambulatory Visit (INDEPENDENT_AMBULATORY_CARE_PROVIDER_SITE_OTHER): Payer: BC Managed Care – PPO | Admitting: Cardiology

## 2021-04-22 VITALS — BP 126/68 | HR 87 | Ht 67.0 in | Wt 228.0 lb

## 2021-04-22 DIAGNOSIS — R079 Chest pain, unspecified: Secondary | ICD-10-CM | POA: Diagnosis not present

## 2021-04-22 DIAGNOSIS — E1169 Type 2 diabetes mellitus with other specified complication: Secondary | ICD-10-CM

## 2021-04-22 DIAGNOSIS — Z79899 Other long term (current) drug therapy: Secondary | ICD-10-CM | POA: Diagnosis not present

## 2021-04-22 DIAGNOSIS — I2510 Atherosclerotic heart disease of native coronary artery without angina pectoris: Secondary | ICD-10-CM

## 2021-04-22 DIAGNOSIS — E669 Obesity, unspecified: Secondary | ICD-10-CM

## 2021-04-22 DIAGNOSIS — E785 Hyperlipidemia, unspecified: Secondary | ICD-10-CM

## 2021-04-22 MED ORDER — ASPIRIN EC 81 MG PO TBEC: 81.0000 mg | DELAYED_RELEASE_TABLET | Freq: Every day | ORAL | 3 refills | Status: AC

## 2021-04-22 MED ORDER — ICOSAPENT ETHYL 1 G PO CAPS: 2.0000 g | ORAL_CAPSULE | Freq: Two times a day (BID) | ORAL | 3 refills | Status: DC

## 2021-04-22 MED ORDER — ROSUVASTATIN CALCIUM 20 MG PO TABS: 20.0000 mg | ORAL_TABLET | Freq: Every day | ORAL | 3 refills | Status: DC

## 2021-04-22 NOTE — Patient Instructions (Addendum)
Medication Instructions:  ? ?STOP TAKING SIMVASTATIN NOW ? ?START TAKING ROSUVASTATIN (CRESTOR) 20 MG BY MOUTH DAILY ? ?START TAKING VASCEPA 2 GRAMS BY MOUTH TWICE DAILY ? ?START TAKING ASPIRIN 81 MG BY MOUTH DAILY ? ?*If you need a refill on your cardiac medications before your next appointment, please call your pharmacy* ? ? ?Lab Work: ? ?IN 6-8 WEEKS HERE IN THE OFFICE--CHECK LIPIDS AND LFTs--PLEASE COME FASTING TO THIS LAB APPOINTMENT ? ?If you have labs (blood work) drawn today and your tests are completely normal, you will receive your results only by: ?MyChart Message (if you have MyChart) OR ?A paper copy in the mail ?If you have any lab test that is abnormal or we need to change your treatment, we will call you to review the results. ? ? ?Testing/Procedures: ? ?Your physician has requested that you have an echocardiogram. Echocardiography is a painless test that uses sound waves to create images of your heart. It provides your doctor with information about the size and shape of your heart and how well your heart?s chambers and valves are working. This procedure takes approximately one hour. There are no restrictions for this procedure. ? ? ? ?Follow-Up: ?At [facility], you and your health needs are our priority.  As part of our continuing mission to provide you with exceptional heart care, we have created designated Provider Care Teams.  These Care Teams include your primary Cardiologist (physician) and Advanced Practice Providers (APPs -  Physician Assistants and Nurse Practitioners) who all work together to provide you with the care you need, when you need it. ? ?We recommend signing up for the patient portal called "MyChart".  Sign up information is provided on this After Visit Summary.  MyChart is used to connect with patients for Virtual Visits (Telemedicine).  Patients are able to view lab/test results, encounter notes, upcoming appointments, etc.  Non-urgent messages can be sent to your provider  as well.   ?To learn more about what you can do with MyChart, go to [email or web address].   ? ?Your next appointment:   ?6 month(s) ? ?The format for your next appointment:   ?In Person ? ?Provider:   ?DR. PEMBERTON  ? ? ?Mediterranean Diet ?A Mediterranean diet refers to food and lifestyle choices that are based on the traditions of countries located on the [employer]. It focuses on eating more fruits, vegetables, whole grains, beans, nuts, seeds, and heart-healthy fats, and eating less dairy, meat, eggs, and processed foods with added sugar, salt, and fat. This way of eating has been shown to help prevent certain conditions and improve outcomes for people who have chronic diseases, like kidney disease and heart disease. ?What are tips for following this plan? ?Reading food labels ?Check the serving size of packaged foods. For foods such as rice and pasta, the serving size refers to the amount of cooked product, not dry. ?Check the total fat in packaged foods. Avoid foods that have saturated fat or trans fats. ?Check the ingredient list for added sugars, such as corn syrup. ?Shopping ? ?Buy a variety of foods that offer a balanced diet, including: ?Fresh fruits and vegetables (produce). ?Grains, beans, nuts, and seeds. Some of these may be available in unpackaged forms or large amounts (in bulk). ?Fresh seafood. ?Poultry and eggs. ?Low-fat dairy products. ?Buy whole ingredients instead of prepackaged foods. ?Buy fresh fruits and vegetables in-season from local farmers markets. ?Buy plain frozen fruits and vegetables. ?If you do not have access to quality fresh seafood, buy  precooked frozen shrimp or canned fish, such as tuna, salmon, or sardines. ?Stock your pantry so you always have certain foods on hand, such as olive oil, canned tuna, canned tomatoes, rice, pasta, and beans. ?Cooking ?Cook foods with extra-virgin olive oil instead of using butter or other vegetable oils. ?Have meat as a side dish, and  have vegetables or grains as your main dish. This means having meat in small portions or adding small amounts of meat to foods like pasta or stew. ?Use beans or vegetables instead of meat in common dishes like chili or lasagna. ?Experiment with different cooking methods. Try roasting, broiling, steaming, and saut?ing vegetables. ?Add frozen vegetables to soups, stews, pasta, or rice. ?Add nuts or seeds for added healthy fats and plant protein at each meal. You can add these to yogurt, salads, or vegetable dishes. ?Marinate fish or vegetables using olive oil, lemon juice, garlic, and fresh herbs. ?Meal planning ?Plan to eat one vegetarian meal one day each week. Try to work up to two vegetarian meals, if possible. ?Eat seafood two or more times a week. ?Have healthy snacks readily available, such as: ?Vegetable sticks with hummus. ?[place] yogurt. ?Fruit and nut trail mix. ?Eat balanced meals throughout the week. This includes: ?Fruit: 2-3 servings a day. ?Vegetables: 4-5 servings a day. ?Low-fat dairy: 2 servings a day. ?Fish, poultry, or lean meat: 1 serving a day. ?Beans and legumes: 2 or more servings a week. ?Nuts and seeds: 1-2 servings a day. ?Whole grains: 6-8 servings a day. ?Extra-virgin olive oil: 3-4 servings a day. ?Limit red meat and sweets to only a few servings a month. ?Lifestyle ? ?Cook and eat meals together with your family, when possible. ?Drink enough fluid to keep your urine pale yellow. ?Be physically active every day. This includes: ?Aerobic exercise like running or swimming. ?Leisure activities like gardening, walking, or housework. ?Get 7-8 hours of sleep each night. ?If recommended by your health care provider, drink red wine in moderation. This means 1 glass a day for nonpregnant women and 2 glasses a day for men. A glass of wine equals 5 oz (150 mL). ?What foods should I eat? ?Fruits ?Apples. Apricots. Avocado. Berries. Bananas. Cherries. Dates. Figs. Grapes. Lemons. Melon. Oranges.  Peaches. Plums. Pomegranate. ?Vegetables ?Artichokes. Beets. Broccoli. Cabbage. Carrots. Eggplant. Green beans. Chard. Kale. Spinach. Onions. Leeks. Peas. Squash. Tomatoes. Peppers. Radishes. ?Grains ?Whole-grain pasta. Brown rice. Bulgur wheat. Polenta. Couscous. Whole-wheat bread. [place]. ?Meats and other proteins ?Beans. Almonds. Sunflower seeds. Pine nuts. Peanuts. Ages. Salmon. Scallops. Shrimp. [place]. Tilapia. Clams. Oysters. Eggs. Poultry without skin. ?Dairy ?Low-fat milk. Cheese. Greek yogurt. ?Fats and oils ?Extra-virgin olive oil. Avocado oil. Grapeseed oil. ?Beverages ?Water. Red wine. Herbal tea. ?Sweets and desserts ?Greek yogurt with honey. Baked apples. Poached pears. Trail mix. ?Seasonings and condiments ?Basil. Cilantro. Coriander. Cumin. Mint. Parsley. Sage. Rosemary. Tarragon. Garlic. Oregano. Thyme. Pepper. Balsamic vinegar. Tahini. Hummus. Tomato sauce. Olives. Mushrooms. ?The items listed above may not be a complete list of foods and beverages you can eat. Contact a dietitian for more information. ?What foods should I limit? ?This is a list of foods that should be eaten rarely or only on special occasions. ?Fruits ?Fruit canned in syrup. ?Vegetables ?Deep-fried potatoes (french fries). ?Grains ?Prepackaged pasta or rice dishes. Prepackaged cereal with added sugar. Prepackaged snacks with added sugar. ?Meats and other proteins ?Beef. Pork. Lamb. Poultry with skin. Hot dogs. [place] Sal[place]. ?Dairy ?Ice cream. Sour cream. Whole milk. ?Fats and oils ?Butter. Canola oil. Vegetable oil. Beef fat (  tallow). Lard. ?Beverages ?Juice. Sugar-sweetened soft drinks. Beer. Liquor and spirits. ?Sweets and desserts ?Cookies. Cakes. Pies. Candy. ?Seasonings and condiments ?Mayonnaise. Pre-made sauces and marinades. ?The items listed above may not be a complete list of foods and beverages you should limit. Contact a dietitian for more information. ?Summary ?The Mediterranean diet includes both food and lifestyle  choices. ?Eat a variety of fresh fruits and vegetables, beans, nuts, seeds, and whole grains. ?Limit the amount of red meat and sweets that you eat. ?If recommended by your health care provider, drink red wi

## 2021-04-27 ENCOUNTER — Encounter: Payer: Self-pay | Admitting: Family Medicine

## 2021-05-02 DIAGNOSIS — G4733 Obstructive sleep apnea (adult) (pediatric): Secondary | ICD-10-CM | POA: Diagnosis not present

## 2021-05-20 ENCOUNTER — Ambulatory Visit (HOSPITAL_COMMUNITY): Payer: BC Managed Care – PPO | Attending: Cardiology

## 2021-05-20 DIAGNOSIS — R079 Chest pain, unspecified: Secondary | ICD-10-CM

## 2021-05-20 LAB — ECHOCARDIOGRAM COMPLETE
Area-P 1/2: 3.08 cm2
S' Lateral: 1.8 cm

## 2021-05-21 ENCOUNTER — Telehealth: Payer: Self-pay | Admitting: *Deleted

## 2021-05-21 DIAGNOSIS — I77810 Thoracic aortic ectasia: Secondary | ICD-10-CM

## 2021-05-21 NOTE — Telephone Encounter (Signed)
-----   Message from Freada Bergeron, MD sent at 05/21/2021 10:38 AM EDT ----- ?Her echo shows normal pumping function and no significant valve disease. Her aorta is just a little big which we can continue to monitor with yearly echoes or CTA scans going forward. Overall, this is a reassuring echo ?

## 2021-05-21 NOTE — Telephone Encounter (Signed)
The patient has been notified of the result and verbalized understanding.  All questions (if any) were answered. ? ?Pt aware we will place the order for repeat echo to be done in one year in the system and send a message to our echo scheduler to call her back and arrange this appt. ?Pt verbalized understanding and agrees with this plan. ? ?

## 2021-05-24 NOTE — Patient Instructions (Signed)

## 2021-05-24 NOTE — Progress Notes (Signed)
? ?PATIENT: Mckenzie Tyler ?DOB: Feb 19, 1970 ? ?REASON FOR VISIT: follow up ?HISTORY FROM: patient ? ?Virtual Visit via Telephone Note ? ?I connected with Mckenzie Tyler on 05/25/21 at  8:45 AM EDT by telephone and verified that I am speaking with the correct person using two identifiers. ?  ?I discussed the limitations, risks, security and privacy concerns of performing an evaluation and management service by telephone and the availability of in person appointments. I also discussed with the patient that there may be a patient responsible charge related to this service. The patient expressed understanding and agreed to proceed. ? ? ?History of Present Illness: ? ?05/25/21 ALL ([place]): ?Mckenzie Tyler is a 51 y.o. female here today for follow up for OSA on CPAP. She continues to do well. She is using CPAP every night for about 7 hours. She does note having a dry mouth at times. She is adjusting humidity which helps. She uses nasal pillow. She may be interested in travel machine.  ? ? ? ? ?05/20/19 (Office):  ?Mckenzie Tyler is a 51 y.o. female here today for follow up of OSA on CPAP. Sleep study in 10/2018 showed "moderate to severe obstructive sleep apnea, with a total AHI of 16.8/hour, REM AHI of 38.1/hour, supine AHI of 23.2/hour and O2 nadir of 77%". Titration study performed and BiPAP attempted, however, patient could not tolerate BiPAP. CPAP was started at set pressure of 15cmH20. She has done well with CPAP therapy at home. She did note increased energy and improved sleep quality after starting therpy. She does continue to have excessive fatigue. She is working on lifestyle changes and managing DM closely with PCP. ?  ?Compliance report dated 04/16/2019 through 05/15/2019 reveals that she has used CPAP 30 of the past 30 days for compliance of 100%.  She has used CPAP greater than 4 hours 30 of the past 30 days for compliance of 100%.  Average usage was 7 hours and 14 minutes.  Residual AHI was 3.4 on  set pressure of 15 cm of water and an EPR of 2.  There was no significant leak noted. ? ? ?Observations/Objective: ? ?Generalized: Well developed, in no acute distress  ?Mentation: Alert oriented to time, place, history taking. Follows all commands speech and language fluent ? ? ?Assessment and Plan: ? ?51 y.o. year old female  has a past medical history of Allergy, Depression, Diabetes mellitus type 2 with complications ([place]), Diabetes mellitus without complication ([place]), Hyperlipidemia (02/13/2013), and Smoker. here with ? ?  ICD-10-CM   ?1. OSA on CPAP  G47.33 For home use only DME continuous positive airway pressure (CPAP)  ? Z99.89   ?  ? ? ?Mckenzie Tyler is doing well on CPAP therapy. Compliance report shows excellent compliance. She was encouraged to continue using CPAP nightly for at least 4 hours. I will send order to DME for supplies. She may consider trying a chin strap to help dry mouth. She will let me know if she needs an order for travel machine pending insurance coverage. Healthy lifestyle habits encouraged. She will follow up in 1 year.  ? ?Orders Placed This Encounter  ?Procedures  ? For home use only DME continuous positive airway pressure (CPAP)  ?  Supplies, patient would like to try a chin strap  ?  Order Specific Question:   Length of Need  ?  Answer:   Lifetime  ?  Order Specific Question:   Patient has OSA or probable OSA  ?  Answer:  Yes  ?  Order Specific Question:   Is the patient currently using CPAP in the home  ?  Answer:   Yes  ?  Order Specific Question:   Settings  ?  Answer:   Other see comments  ?  Order Specific Question:   CPAP supplies needed  ?  Answer:   Mask, headgear, cushions, filters, heated tubing and water chamber  ? ? ?No orders of the defined types were placed in this encounter. ? ? ? ?Follow Up Instructions: ? ?I discussed the assessment and treatment plan with the patient. The patient was provided an opportunity to ask questions and all were answered. The patient agreed  with the plan and demonstrated an understanding of the instructions. ?  ?The patient was advised to call back or seek an in-person evaluation if the symptoms worsen or if the condition fails to improve as anticipated. ? ?I provided 15 minutes of non-face-to-face time during this encounter. Patient located at their place of residence during [place] visit. Provider is in the office.  ? ? ?Debbora Presto, NP  ? ? ?

## 2021-05-25 ENCOUNTER — Encounter: Payer: Self-pay | Admitting: Family Medicine

## 2021-05-25 ENCOUNTER — Telehealth (INDEPENDENT_AMBULATORY_CARE_PROVIDER_SITE_OTHER): Payer: BC Managed Care – PPO | Admitting: Family Medicine

## 2021-05-25 DIAGNOSIS — Z9989 Dependence on other enabling machines and devices: Secondary | ICD-10-CM

## 2021-05-25 DIAGNOSIS — G4733 Obstructive sleep apnea (adult) (pediatric): Secondary | ICD-10-CM | POA: Diagnosis not present

## 2021-05-25 NOTE — Progress Notes (Signed)
CM sent to [facility] for new order ?

## 2021-06-01 DIAGNOSIS — G4733 Obstructive sleep apnea (adult) (pediatric): Secondary | ICD-10-CM | POA: Diagnosis not present

## 2021-06-11 DIAGNOSIS — E113592 Type 2 diabetes mellitus with proliferative diabetic retinopathy without macular edema, left eye: Secondary | ICD-10-CM | POA: Diagnosis not present

## 2021-06-11 DIAGNOSIS — H3582 Retinal ischemia: Secondary | ICD-10-CM | POA: Diagnosis not present

## 2021-06-11 DIAGNOSIS — E113511 Type 2 diabetes mellitus with proliferative diabetic retinopathy with macular edema, right eye: Secondary | ICD-10-CM | POA: Diagnosis not present

## 2021-06-11 DIAGNOSIS — H35371 Puckering of macula, right eye: Secondary | ICD-10-CM | POA: Diagnosis not present

## 2021-06-17 ENCOUNTER — Other Ambulatory Visit: Payer: Self-pay | Admitting: Family Medicine

## 2021-06-18 NOTE — Telephone Encounter (Signed)
Requested Prescriptions  Pending Prescriptions Disp Refills  . metFORMIN (GLUCOPHAGE) 1000 MG tablet [Pharmacy Med Name: metFORMIN HCl 1000 MG Oral Tablet] 180 tablet 0    Sig: TAKE 1 TABLET BY MOUTH TWICE DAILY WITH A MEAL     Endocrinology:  Diabetes - Biguanides Passed - 06/17/2021  5:14 PM      Passed - Cr in normal range and within 360 days    Creat  Date Value Ref Range Status  03/30/2021 0.72 0.50 - 1.03 mg/dL Final         Passed - HBA1C is between 0 and 7.9 and within 180 days    Hgb A1C (fingerstick)  Date Value Ref Range Status  04/15/2015 7.2 (H) <5.7 % Final    Comment:                                                                           According to the ADA Clinical Practice Recommendations for 2011, when HbA1c is used as a screening test:     >=6.5%   Diagnostic of Diabetes Mellitus            (if abnormal result is confirmed)   5.7-6.4%   Increased risk of developing Diabetes Mellitus   References:Diagnosis and Classification of Diabetes Mellitus,Diabetes TXMI,6803,21(YYQMG 1):S62-S69 and Standards of Medical Care in         Diabetes - 2011,Diabetes Care,2011,34 (Suppl 1):S11-S61.      Hgb A1c MFr Bld  Date Value Ref Range Status  03/30/2021 6.7 (H) <5.7 % of total Hgb Final    Comment:    For someone without known diabetes, a hemoglobin A1c value of 6.5% or greater indicates that they may have  diabetes and this should be confirmed with a follow-up  test. . For someone with known diabetes, a value <7% indicates  that their diabetes is well controlled and a value  greater than or equal to 7% indicates suboptimal  control. A1c targets should be individualized based on  duration of diabetes, age, comorbid conditions, and  other considerations. . Currently, no consensus exists regarding use of hemoglobin A1c for diagnosis of diabetes for children. .          Passed - eGFR in normal range and within 360 days    GFR, Est African American  Date  Value Ref Range Status  05/22/2020 102 > OR = 60 mL/min/1.56m Final   GFR, Est Non African American  Date Value Ref Range Status  05/22/2020 88 > OR = 60 mL/min/1.763mFinal   eGFR  Date Value Ref Range Status  03/30/2021 101 > OR = 60 mL/min/1.732minal    Comment:    The eGFR is based on the CKD-EPI 2021 equation. To calculate  the new eGFR from a previous Creatinine or Cystatin C result, go to https://www.kidney.org/professionals/ kdoqi/gfr%5Fcalculator          Passed - B12 Level in normal range and within 720 days    Vitamin B-12  Date Value Ref Range Status  07/16/2019 594 200 - 1,100 pg/mL Final         Passed - Valid encounter within last 6 months    Recent Outpatient Visits  2 months ago Type 2 diabetes mellitus with complication ([place])   [place] Pickard, Cammie Mcgee, MD   6 months ago Pain in both hands   [place] Eulogio Bear, NP   1 year ago Type 2 diabetes mellitus with complication ([place])   [place] [name], MD   1 year ago Type 2 diabetes mellitus with complication ([place])   [place] [name], MD   1 year ago Type 2 diabetes mellitus with complication ([place])   [place] Pickard, Cammie Mcgee, MD      Future Appointments            In 3 months Pickard, Cammie Mcgee, MD [place], [facility]   In 3 months Johney Frame, Greer Ee, MD [place]   In 11 months Lomax, Amy, NP [facility]           Passed - CBC within normal limits and completed in the last 12 months    WBC  Date Value Ref Range Status  03/30/2021 5.0 3.8 - 10.8 Thousand/uL Final   RBC  Date Value Ref Range Status  03/30/2021 4.89 3.80 - 5.10 Million/uL Final   Hemoglobin  Date Value Ref Range Status  03/30/2021 15.2 11.7 - 15.5 g/dL Final   HCT  Date Value Ref Range Status  03/30/2021 44.2 35.0 -  45.0 % Final   MCHC  Date Value Ref Range Status  03/30/2021 34.4 32.0 - 36.0 g/dL Final   [facility]  Date Value Ref Range Status  03/30/2021 31.1 27.0 - 33.0 pg Final   MCV  Date Value Ref Range Status  03/30/2021 90.4 80.0 - 100.0 fL Final   No results found for: PLTCOUNTKUC, LABPLAT, POCPLA RDW  Date Value Ref Range Status  03/30/2021 13.5 11.0 - 15.0 % Final

## 2021-06-28 ENCOUNTER — Encounter: Payer: Self-pay | Admitting: Family Medicine

## 2021-06-29 NOTE — Telephone Encounter (Signed)
Call pt and made an appt for 07/01/21 at 345pm

## 2021-07-01 ENCOUNTER — Ambulatory Visit (INDEPENDENT_AMBULATORY_CARE_PROVIDER_SITE_OTHER): Payer: BC Managed Care – PPO | Admitting: Family Medicine

## 2021-07-01 VITALS — BP 128/58 | HR 88 | Temp 98.3°F | Ht 67.0 in | Wt 226.6 lb

## 2021-07-01 DIAGNOSIS — M5432 Sciatica, left side: Secondary | ICD-10-CM

## 2021-07-01 MED ORDER — MELOXICAM 15 MG PO TABS: 15.0000 mg | ORAL_TABLET | Freq: Every day | ORAL | 0 refills | Status: DC

## 2021-07-01 NOTE — Progress Notes (Signed)
Subjective:    Patient ID: Mckenzie Tyler, female    DOB: [birth year], 51 y.o.   MRN: [id number]   Patient has tried to change her activity recently.  She started walking every day trying to lose weight and become more active.  She has now developed sciatica.  She reports burning in her left posterior gluteus that radiates down her left posterior thigh to just below her left knee.  She denies any significant back pain.  She denies any bowel or bladder incontinence.  She denies any leg weakness.  She denies any saddle anesthesia. Past Medical History:  Diagnosis Date   Allergy    Depression    Diabetes mellitus type 2 with complications ([place])    Diabetes mellitus without complication ([place])    Phreesia 01/12/2020   Hyperlipidemia 02/13/2013   Smoker    Past Surgical History:  Procedure Laterality Date   EYE SURGERY N/A    Phreesia 01/12/2020   RETINOPATHY SURGERY  11/16/2017   due to diabetes    TONSILLECTOMY AND ADENOIDECTOMY     age 16   Current Outpatient Medications on File Prior to Visit  Medication Sig Dispense Refill   aspirin EC 81 MG tablet Take 1 tablet (81 mg total) by mouth daily. Swallow whole. 90 tablet 3   calcium carbonate (OS-CAL) 600 MG tablet Take 600 mg by mouth daily.     fluticasone (FLONASE) 50 MCG/ACT nasal spray Place 1 spray into both nostrils 2 (two) times daily.     HUMALOG KWIKPEN 100 UNIT/ML KwikPen INJECT 15 TO 20 UNITS SUBCUTANEOUSLY THREE TIMES DAILY 15 mL 3   icosapent Ethyl (VASCEPA) 1 g capsule Take 2 capsules (2 g total) by mouth 2 (two) times daily. 120 capsule 3   insulin glargine (LANTUS) 100 UNIT/ML injection INJECT 60 TO 80 UNITS SUBCUTANEOUS AT BEDTIME 30 mL 6   Insulin Pen Needle (PEN NEEDLES) 32G X 5 MM MISC 1 Units by Does not apply route 3 (three) times daily. 100 each 2   Insulin Syringe-Needle U-100 25G X 1" 1 ML MISC Use as directed 100 each 3   JARDIANCE 25 MG TABS tablet TAKE 1 TABLET BY MOUTH ONCE DAILY BEFORE BREAKFAST 90 tablet  3   Magnesium 400 MG TABS Take by mouth.     metFORMIN (GLUCOPHAGE) 1000 MG tablet TAKE 1 TABLET BY MOUTH TWICE DAILY WITH A MEAL 180 tablet 0   Multiple Vitamin (MULTIVITAMIN) tablet Take 1 tablet by mouth daily.     POTASSIUM PO Take by mouth.     RELION PRIME TEST test strip USE AS DIRECTED THREE TIMES DAILY TO  MONITOR  BLOOD  SUGAR 100 each 6   VICTOZA 18 MG/3ML SOPN INJECT 1.8 MG SUBCUTANEOUSLY ONCE DAILY 9 mL 3   vitamin C (ASCORBIC ACID) 500 MG tablet Take 500 mg by mouth daily.     rosuvastatin (CRESTOR) 20 MG tablet Take 1 tablet (20 mg total) by mouth daily. (Patient not taking: Reported on 07/01/2021) 90 tablet 3   No current facility-administered medications on file prior to visit.   Allergies  Allergen Reactions   Codeine Itching   Social History   Socioeconomic History   Marital status: Single    Spouse name: Not on file   Number of children: Not on file   Years of education: Not on file   Highest education level: Not on file  Occupational History   Not on file  Tobacco Use   Smoking status: Former  Packs/day: 0.25    Types: Cigarettes    Quit date: 01/21/2018    Years since quitting: 3.4   Smokeless tobacco: Never   Tobacco comments:    has been trying to quit  Substance and Sexual Activity   Alcohol use: No    Alcohol/week: 0.0 standard drinks   Drug use: No   Sexual activity: Yes    Birth control/protection: None  Other Topics Concern   Not on file  Social History Narrative   Not on file   Social Determinants of Health   Financial Resource Strain: Not on file  Food Insecurity: Not on file  Transportation Needs: Not on file  Physical Activity: Not on file  Stress: Not on file  Social Connections: Not on file  Intimate Partner Violence: Not on file      Review of Systems  All other systems reviewed and are negative.     Objective:   Physical Exam Vitals reviewed.  Constitutional:      General: She is not in acute distress.     Appearance: She is not ill-appearing.  Cardiovascular:     Rate and Rhythm: Normal rate and regular rhythm.     Pulses: Normal pulses.     Heart sounds: Normal heart sounds. No murmur heard.   No gallop.  Pulmonary:     Effort: Pulmonary effort is normal. No respiratory distress.     Breath sounds: Normal breath sounds. No stridor. No wheezing, rhonchi or rales.  Chest:     Chest wall: No tenderness.  Abdominal:     General: Bowel sounds are normal. There is no distension.     Tenderness: There is no guarding or rebound.     Hernia: No hernia is present.  Musculoskeletal:     Right lower leg: No edema.     Left lower leg: No edema.       Legs:  Neurological:     Mental Status: She is alert.       Assessment & Plan:  Sciatica of left side  Try meloxicam 15 mg daily for the next 2 weeks to see if symptoms improve.  If not proceed with imaging of the lumbar spine.  Consider weight loss by switching Victoza to [facility] to help with any degenerative disc disease as I suspect that this is most likely potential cause of her sciatica

## 2021-07-13 ENCOUNTER — Encounter: Payer: Self-pay | Admitting: Family Medicine

## 2021-07-15 ENCOUNTER — Other Ambulatory Visit: Payer: Self-pay | Admitting: Family Medicine

## 2021-07-15 MED ORDER — PREDNISONE 20 MG PO TABS: ORAL_TABLET | ORAL | 0 refills | Status: DC

## 2021-07-17 ENCOUNTER — Other Ambulatory Visit: Payer: Self-pay | Admitting: Family Medicine

## 2021-07-19 NOTE — Telephone Encounter (Signed)
Requested Prescriptions  Pending Prescriptions Disp Refills  . RELION PRIME TEST test strip [Pharmacy Med Name: ReliOn Prime Test In Vitro Strip] 100 each 6    Sig: USE AS DIRECTED THREE TIMES DAILY TO [place] GLUCOSE     Endocrinology: Diabetes - Testing Supplies Passed - 07/19/2021  9:07 AM      Passed - Valid encounter within last 12 months    Recent Outpatient Visits          2 weeks ago Sciatica of left side   [place] [name], MD   3 months ago Type 2 diabetes mellitus with complication ([place])   [place] Pickard, Cammie Mcgee, MD   7 months ago Pain in both hands   [place], NP   1 year ago Type 2 diabetes mellitus with complication ([place])   [place] [name], MD   1 year ago Type 2 diabetes mellitus with complication ([place])   [place] Pickard, Cammie Mcgee, MD      Future Appointments            In 2 months Pickard, Cammie Mcgee, MD [place], [facility]   In 2 months Johney Frame, Greer Ee, MD [place]   In 10 months Lomax, Amy, NP [facility]

## 2021-08-24 ENCOUNTER — Encounter: Payer: Self-pay | Admitting: Family Medicine

## 2021-08-24 ENCOUNTER — Other Ambulatory Visit: Payer: Self-pay | Admitting: Family Medicine

## 2021-08-25 ENCOUNTER — Other Ambulatory Visit: Payer: Self-pay

## 2021-08-25 DIAGNOSIS — R079 Chest pain, unspecified: Secondary | ICD-10-CM

## 2021-08-25 DIAGNOSIS — Z79899 Other long term (current) drug therapy: Secondary | ICD-10-CM

## 2021-08-25 DIAGNOSIS — E785 Hyperlipidemia, unspecified: Secondary | ICD-10-CM

## 2021-08-25 MED ORDER — ICOSAPENT ETHYL 1 G PO CAPS: 2.0000 g | ORAL_CAPSULE | Freq: Two times a day (BID) | ORAL | 2 refills | Status: DC

## 2021-09-06 ENCOUNTER — Other Ambulatory Visit: Payer: Self-pay | Admitting: Family Medicine

## 2021-09-12 ENCOUNTER — Other Ambulatory Visit: Payer: Self-pay | Admitting: Family Medicine

## 2021-09-17 ENCOUNTER — Other Ambulatory Visit: Payer: Self-pay | Admitting: Family Medicine

## 2021-09-27 ENCOUNTER — Encounter: Payer: Self-pay | Admitting: Family Medicine

## 2021-10-01 ENCOUNTER — Ambulatory Visit: Payer: Self-pay | Admitting: Family Medicine

## 2021-10-01 DIAGNOSIS — H43822 Vitreomacular adhesion, left eye: Secondary | ICD-10-CM | POA: Diagnosis not present

## 2021-10-01 DIAGNOSIS — H3582 Retinal ischemia: Secondary | ICD-10-CM | POA: Diagnosis not present

## 2021-10-01 DIAGNOSIS — H35371 Puckering of macula, right eye: Secondary | ICD-10-CM | POA: Diagnosis not present

## 2021-10-01 DIAGNOSIS — E113511 Type 2 diabetes mellitus with proliferative diabetic retinopathy with macular edema, right eye: Secondary | ICD-10-CM | POA: Diagnosis not present

## 2021-10-01 DIAGNOSIS — E113592 Type 2 diabetes mellitus with proliferative diabetic retinopathy without macular edema, left eye: Secondary | ICD-10-CM | POA: Diagnosis not present

## 2021-10-07 NOTE — Progress Notes (Deleted)
Cardiology Office Note:    Date:  10/07/2021   ID:  [name], DOB May 14, 1970, MRN [id number]  PCP:  [name], MD   [facility] Providers Cardiologist:  None {  Referring MD: [name], MD    History of Present Illness:    Mckenzie Tyler is a 51 y.o. female with a hx of OSA on CPAP, DMII, HLD and elevated Ca score of 622 who presents to clinic for follow-up.  Patient was initially seen in 03/2021 after Ca score cam back at 622. LAD 275, RCA 347. 99% for age, gender, race matched controls. At that visit she was feeling well from a CV standpoint. TTE 05/2021 with LVEF 60-65%, normal RV, moderate MAC, borderline ascending aorta 52m.  Today, ***  Past Medical History:  Diagnosis Date   Allergy    Depression    Diabetes mellitus type 2 with complications ([facility]    Diabetes mellitus without complication ([place]    Phreesia 01/12/2020   Hyperlipidemia 02/13/2013   Smoker     Past Surgical History:  Procedure Laterality Date   EYE SURGERY N/A    Phreesia 01/12/2020   RETINOPATHY SURGERY  11/16/2017   due to diabetes    TONSILLECTOMY AND ADENOIDECTOMY     age 89   Current Medications: No outpatient medications have been marked as taking for the 10/13/21 encounter (Appointment) with PFreada Bergeron MD.     Allergies:   Codeine   Social History   Socioeconomic History   Marital status: Single    Spouse name: Not on file   Number of children: Not on file   Years of education: Not on file   Highest education level: Not on file  Occupational History   Not on file  Tobacco Use   Smoking status: Former    Packs/day: 0.25    Types: Cigarettes    Quit date: 01/21/2018    Years since quitting: 3.7   Smokeless tobacco: Never   Tobacco comments:    has been trying to quit  Substance and Sexual Activity   Alcohol use: No    Alcohol/week: 0.0 standard drinks of alcohol   Drug use: No   Sexual activity: Yes    Birth control/protection:  None  Other Topics Concern   Not on file  Social History Narrative   Not on file   Social Determinants of Health   Financial Resource Strain: Not on file  Food Insecurity: Not on file  Transportation Needs: Not on file  Physical Activity: Not on file  Stress: Not on file  Social Connections: Not on file     Family History: The patient's family history includes Diabetes in her father. There is no history of Cancer, Heart disease, Colon cancer, Colon polyps, Rectal cancer, or Stomach cancer.  ROS:   Please see the history of present illness.    Review of Systems  Constitutional:  Negative for malaise/fatigue and weight loss.  HENT:  Negative for congestion and sore throat.   Eyes:  Negative for blurred vision.  Respiratory:  Positive for shortness of breath (exertional). Negative for cough.   Cardiovascular:  Positive for chest pain (central chest). Negative for palpitations, orthopnea, claudication, leg swelling and PND.  Gastrointestinal:  Negative for heartburn and nausea.  Genitourinary:  Negative for dysuria and urgency.  Musculoskeletal:  Negative for myalgias.  Skin:  Negative for itching and rash.  Neurological:  Negative for dizziness and headaches.  Endo/Heme/Allergies:  Does not  bruise/bleed easily.  Psychiatric/Behavioral:  The patient is not nervous/anxious and does not have insomnia.    All other systems reviewed and are negative.  EKGs/Labs/Other Studies Reviewed:    The following studies were reviewed today: TTE 2021/05/26: IMPRESSIONS   1. Left ventricular ejection fraction, by estimation, is 60 to 65%. The  left ventricle has normal function. The left ventricle has no regional  wall motion abnormalities. There is mild concentric left ventricular  hypertrophy. Left ventricular diastolic  parameters were normal.   2. Right ventricular systolic function is normal. The right ventricular  size is normal.   3. The mitral valve is normal in structure. No  evidence of mitral valve  regurgitation. No evidence of mitral stenosis. Moderate mitral annular  calcification.   4. The aortic valve is normal in structure. Aortic valve regurgitation is  not visualized. No aortic stenosis is present.   5. There is borderline dilatation of the ascending aorta, measuring 36  mm.   6. The inferior vena cava is normal in size with greater than 50%  respiratory variability, suggesting right atrial pressure of 3 mmHg.   Comparison(s): No prior Echocardiogram.  Ca Score 03/2021: FINDINGS: Coronary arteries: Normal origins.   Coronary Calcium Score:   Left main: 0   Left anterior descending artery: 275   Left circumflex artery: 0   Right coronary artery: 347   Total: 622   Percentile: 99th   Pericardium: Normal.   Aorta: Normal caliber.  Atherosclerosis.   Heavy mitral annular calcification.   Non-cardiac: See separate report from [facility].   IMPRESSION: 1. Coronary calcium score of 622. This was 99th percentile for age-, race-, and sex-matched controls. 2. Aortic atherosclerosis. 3. Heavy mitral annular calcification. 4. Cardiology evaluation is recommended.    EKG:  04/22/21: NSR, HR 87 bpm  Recent Labs: 03/30/2021: ALT 55; BUN 17; Creat 0.72; Hemoglobin 15.2; Platelets 187; Potassium 4.4; Sodium 141  Recent Lipid Panel    Component Value Date/Time   CHOL 162 03/30/2021 0815   TRIG 377 (H) 03/30/2021 0815   HDL 36 (L) 03/30/2021 0815   CHOLHDL 4.5 03/30/2021 0815   VLDL 44 (H) 01/21/2016 1207   Phelps 81 03/30/2021 0815     Risk Assessment/Calculations:           Physical Exam:    VS:  LMP 04/25/2013     Wt Readings from Last 3 Encounters:  07/01/21 226 lb 9.6 oz (102.8 kg)  04/22/21 228 lb (103.4 kg)  03/30/21 226 lb (102.5 kg)     GEN: Well nourished, well developed in no acute distress HEENT: Normal NECK: No JVD; No carotid bruits LYMPHATICS: No lymphadenopathy CARDIAC: RRR, no murmurs, rubs,  gallops RESPIRATORY:  Clear to auscultation without rales, wheezing or rhonchi  ABDOMEN: Soft, non-tender, non-distended MUSCULOSKELETAL:  No edema; No deformity  SKIN: Warm and dry NEUROLOGIC:  Alert and oriented x 3 PSYCHIATRIC:  Normal affect   ASSESSMENT:    No diagnosis found.  PLAN:    In order of problems listed above:  #CAD: Ca score 622 which is the 99% for age, gender, race matched controls. TTE May 26, 2021 with normal EF and no significant valve disease. No anginal symptoms and do not suspect her constant, nonexertional chest pain is related to coronary disease. Will plan for aggressive risk factor modification.  -Continue ASA 13m daily -Continue crestor 259mdaily -Continue vascepa 2g BID -Lifestyle modifications as detailed below  #HLD: -Continue crestor 2033maily -Continue vascepa 2g BID -Can  add fenofibrate if needed -Goal LDL<70 -Goal TG<150 -Repeat lipids in 6-8weeks  #DMII on Insulin: -Continue insulin -Continue jardiance -Continue metformin -A1C at goal at 6.7 -Lifestyle modifications as below  #Obesity: BMI 35. Discussed diet and weight loss at length today. -Lifestyle modifications as detailed above  Exercise recommendations: Goal of exercising for at least 30 minutes a day, at least 5 times per week.  Please exercise to a moderate exertion.  This means that while exercising it is difficult to speak in full sentences, however you are not so short of breath that you feel you must stop, and not so comfortable that you can carry on a full conversation.  Exertion level should be approximately a 5/10, if 10 is the most exertion you can perform.  Diet recommendations: Recommend a heart healthy diet such as the Mediterranean diet.  This diet consists of plant based foods, healthy fats, lean meats, olive oil.  It suggests limiting the intake of simple carbohydrates such as white breads, pastries, and pastas.  It also limits the amount of red meat, wine, and  dairy products such as cheese that one should consume on a daily basis.       Medication Adjustments/Labs and Tests Ordered: Current medicines are reviewed at length with the patient today.  Concerns regarding medicines are outlined above.  No orders of the defined types were placed in this encounter.  No orders of the defined types were placed in this encounter.   There are no Patient Instructions on file for this visit.    Signed, Freada Bergeron, MD  10/07/2021 4:08 PM    [facility]

## 2021-10-08 ENCOUNTER — Ambulatory Visit (INDEPENDENT_AMBULATORY_CARE_PROVIDER_SITE_OTHER): Payer: BC Managed Care – PPO | Admitting: Family Medicine

## 2021-10-08 VITALS — BP 112/62 | HR 79 | Temp 98.3°F | Ht 67.0 in | Wt 203.0 lb

## 2021-10-08 DIAGNOSIS — E118 Type 2 diabetes mellitus with unspecified complications: Secondary | ICD-10-CM

## 2021-10-08 NOTE — Progress Notes (Signed)
Subjective:    Patient ID: Mckenzie Tyler, female    DOB: January 06, 1971, 51 y.o.   MRN: [id number]  Patient is a very pleasant 51 year old Caucasian female with a history of type 2 diabetes mellitus.   Wt Readings from Last 3 Encounters:  10/08/21 203 lb (92.1 kg)  07/01/21 226 lb 9.6 oz (102.8 kg)  04/22/21 228 lb (103.4 kg)  Patient has lost 23 pounds since I last saw her.  I am so proud of her.  She is working with the weight loss center and having significant success with this.  She is also been able to stop all insulin and Victoza.  She is only taking metformin and Jardiance.  Despite stopping the medication, she states that her sugars are typically between 80 and 110.  She denies any sugars greater than 200.  Unfortunately she still is dealing with sciatica.  She is due to schedule her mammogram.  She is also overdue for a Pap smear but she defers this and prefers a female provider. Past Medical History:  Diagnosis Date   Allergy    Depression    Diabetes mellitus type 2 with complications ([place])    Diabetes mellitus without complication ([place])    Phreesia 01/12/2020   Hyperlipidemia 02/13/2013   Smoker    Past Surgical History:  Procedure Laterality Date   EYE SURGERY N/A    Phreesia 01/12/2020   RETINOPATHY SURGERY  11/16/2017   due to diabetes    TONSILLECTOMY AND ADENOIDECTOMY     age 5   Current Outpatient Medications on File Prior to Visit  Medication Sig Dispense Refill   aspirin EC 81 MG tablet Take 1 tablet (81 mg total) by mouth daily. Swallow whole. 90 tablet 3   calcium carbonate (OS-CAL) 600 MG tablet Take 600 mg by mouth daily.     fluticasone (FLONASE) 50 MCG/ACT nasal spray Place 1 spray into both nostrils 2 (two) times daily.     icosapent Ethyl (VASCEPA) 1 g capsule Take 2 capsules (2 g total) by mouth 2 (two) times daily. 360 capsule 2   Insulin Pen Needle (PEN NEEDLES) 32G X 5 MM MISC 1 Units by Does not apply route 3 (three) times daily. 100 each 2    Insulin Syringe-Needle U-100 25G X 1" 1 ML MISC Use as directed 100 each 3   JARDIANCE 25 MG TABS tablet TAKE 1 TABLET BY MOUTH ONCE DAILY BEFORE BREAKFAST 90 tablet 3   Magnesium 400 MG TABS Take by mouth.     metFORMIN (GLUCOPHAGE) 1000 MG tablet TAKE 1 TABLET BY MOUTH TWICE DAILY WITH A MEAL 180 tablet 3   Multiple Vitamin (MULTIVITAMIN) tablet Take 1 tablet by mouth daily.     POTASSIUM PO Take by mouth.     RELION PRIME TEST test strip USE AS DIRECTED THREE TIMES DAILY TO CHECK GLUCOSE 100 each 6   rosuvastatin (CRESTOR) 20 MG tablet Take 1 tablet (20 mg total) by mouth daily. 90 tablet 3   vitamin C (ASCORBIC ACID) 500 MG tablet Take 500 mg by mouth daily.     HUMALOG KWIKPEN 100 UNIT/ML KwikPen INJECT 15 TO 20 UNITS SUBCUTANEOUSLY THREE TIMES DAILY (Patient not taking: Reported on 10/08/2021) 15 mL 3   insulin glargine (LANTUS) 100 UNIT/ML injection INJECT 60 TO 80 UNITS SUBCUTANEOUS AT BEDTIME (Patient not taking: Reported on 10/08/2021) 30 mL 6   VICTOZA 18 MG/3ML SOPN INJECT 1.8 ML SUBCUTANEOUSLY  ONCE DAILY (Patient not taking: Reported on 10/08/2021)  9 mL 3   No current facility-administered medications on file prior to visit.   Allergies  Allergen Reactions   Codeine Itching   Social History   Socioeconomic History   Marital status: Single    Spouse name: Not on file   Number of children: Not on file   Years of education: Not on file   Highest education level: Not on file  Occupational History   Not on file  Tobacco Use   Smoking status: Former    Packs/day: 0.25    Types: Cigarettes    Quit date: 01/21/2018    Years since quitting: 3.7   Smokeless tobacco: Never   Tobacco comments:    has been trying to quit  Substance and Sexual Activity   Alcohol use: No    Alcohol/week: 0.0 standard drinks of alcohol   Drug use: No   Sexual activity: Yes    Birth control/protection: None  Other Topics Concern   Not on file  Social History Narrative   Not on file   Social  Determinants of Health   Financial Resource Strain: Not on file  Food Insecurity: Not on file  Transportation Needs: Not on file  Physical Activity: Not on file  Stress: Not on file  Social Connections: Not on file  Intimate Partner Violence: Not on file      Review of Systems  All other systems reviewed and are negative.      Objective:   Physical Exam Vitals reviewed.  Constitutional:      General: She is not in acute distress.    Appearance: She is not ill-appearing.  Cardiovascular:     Rate and Rhythm: Normal rate and regular rhythm.     Pulses: Normal pulses.     Heart sounds: Normal heart sounds. No murmur heard.    No gallop.  Pulmonary:     Effort: Pulmonary effort is normal. No respiratory distress.     Breath sounds: Normal breath sounds. No stridor. No wheezing, rhonchi or rales.  Chest:     Chest wall: No tenderness.  Abdominal:     General: Bowel sounds are normal. There is no distension.     Tenderness: There is no guarding or rebound.     Hernia: No hernia is present.  Musculoskeletal:     Right lower leg: No edema.     Left lower leg: No edema.  Neurological:     Mental Status: She is alert.        Assessment & Plan:  Type 2 diabetes mellitus with complication (HCC) - Plan: Hemoglobin A1c, CBC with Differential/Platelet, Lipid panel, Microalbumin, urine, COMPLETE METABOLIC PANEL WITH GFR I am very proud of the patient for losing weight.  I encouraged her to keep it up.  Her blood pressure is outstanding.  Her blood sugars sound well controlled.  I will monitor an A1c.  If she continues the current trajectory, I would recommend trying to wean off metformin gradually by decreasing to 500 mg twice a day and then eventually off the medication if her blood sugars remain stable.  We could then try weaning off Jardiance.

## 2021-10-09 LAB — COMPLETE METABOLIC PANEL WITH GFR
AG Ratio: 2.1 (calc) (ref 1.0–2.5)
ALT: 29 U/L (ref 6–29)
AST: 23 U/L (ref 10–35)
Albumin: 4.6 g/dL (ref 3.6–5.1)
Alkaline phosphatase (APISO): 44 U/L (ref 37–153)
BUN: 17 mg/dL (ref 7–25)
CO2: 25 mmol/L (ref 20–32)
Calcium: 9.6 mg/dL (ref 8.6–10.4)
Chloride: 102 mmol/L (ref 98–110)
Creat: 0.87 mg/dL (ref 0.50–1.03)
Globulin: 2.2 g/dL (calc) (ref 1.9–3.7)
Glucose, Bld: 111 mg/dL — ABNORMAL HIGH (ref 65–99)
Potassium: 4.4 mmol/L (ref 3.5–5.3)
Sodium: 140 mmol/L (ref 135–146)
Total Bilirubin: 0.6 mg/dL (ref 0.2–1.2)
Total Protein: 6.8 g/dL (ref 6.1–8.1)
eGFR: 80 mL/min/{1.73_m2} (ref >=60)

## 2021-10-09 LAB — HEMOGLOBIN A1C
Hgb A1c MFr Bld: 6 % of total Hgb — ABNORMAL HIGH (ref <5.7)
Mean Plasma Glucose: 126 mg/dL
eAG (mmol/L): 7 mmol/L

## 2021-10-09 LAB — CBC WITH DIFFERENTIAL/PLATELET
Absolute Monocytes: 492 cells/uL (ref 200–950)
Basophils Absolute: 41 cells/uL (ref 0–200)
Basophils Relative: 1 %
Eosinophils Absolute: 394 cells/uL (ref 15–500)
Eosinophils Relative: 9.6 %
HCT: 43.8 % (ref 35.0–45.0)
Hemoglobin: 15.1 g/dL (ref 11.7–15.5)
Lymphs Abs: 1177 cells/uL (ref 850–3900)
MCH: 30.4 pg (ref 27.0–33.0)
MCHC: 34.5 g/dL (ref 32.0–36.0)
MCV: 88.3 fL (ref 80.0–100.0)
MPV: 10.8 fL (ref 7.5–12.5)
Monocytes Relative: 12 %
Neutro Abs: 1997 cells/uL (ref 1500–7800)
Neutrophils Relative %: 48.7 %
Platelets: 210 10*3/uL (ref 140–400)
RBC: 4.96 10*6/uL (ref 3.80–5.10)
RDW: 13.3 % (ref 11.0–15.0)
Total Lymphocyte: 28.7 %
WBC: 4.1 10*3/uL (ref 3.8–10.8)

## 2021-10-09 LAB — LIPID PANEL
Cholesterol: 87 mg/dL (ref <200)
HDL: 26 mg/dL — ABNORMAL LOW (ref >=50)
LDL Cholesterol (Calc): 34 mg/dL (calc)
Non-HDL Cholesterol (Calc): 61 mg/dL (calc) (ref <130)
Total CHOL/HDL Ratio: 3.3 (calc) (ref <5.0)
Triglycerides: 195 mg/dL — ABNORMAL HIGH (ref <150)

## 2021-10-09 LAB — MICROALBUMIN, URINE: Microalb, Ur: 0.3 mg/dL

## 2021-10-13 ENCOUNTER — Ambulatory Visit: Payer: BC Managed Care – PPO | Admitting: Cardiology

## 2021-10-14 DIAGNOSIS — G4733 Obstructive sleep apnea (adult) (pediatric): Secondary | ICD-10-CM | POA: Diagnosis not present

## 2021-10-14 NOTE — Progress Notes (Unsigned)
Complete physical exam  Patient: Mckenzie Tyler   DOB: 02-17-1970   51 y.o. Female  MRN: [id number]  Subjective:    No chief complaint on file.   Mckenzie Tyler is a 51 y.o. female who presents today for a complete physical exam. She reports consuming a {diet types:17450} diet. {types:19826} She generally feels {DESC; WELL/FAIRLY WELL/POORLY:18703}. She reports sleeping {DESC; WELL/FAIRLY WELL/POORLY:18703}. She {does/does not:200015} have additional problems to discuss today.   PAP today  Past Medical History:  Diagnosis Date   Allergy    Depression    Diabetes mellitus type 2 with complications ([place])    Diabetes mellitus without complication ([place])    Phreesia 01/12/2020   Hyperlipidemia 02/13/2013   Smoker    Past Surgical History:  Procedure Laterality Date   EYE SURGERY N/A    Phreesia 01/12/2020   RETINOPATHY SURGERY  11/16/2017   due to diabetes    TONSILLECTOMY AND ADENOIDECTOMY     age 76   Current Outpatient Medications on File Prior to Visit  Medication Sig Dispense Refill   aspirin EC 81 MG tablet Take 1 tablet (81 mg total) by mouth daily. Swallow whole. 90 tablet 3   calcium carbonate (OS-CAL) 600 MG tablet Take 600 mg by mouth daily.     fluticasone (FLONASE) 50 MCG/ACT nasal spray Place 1 spray into both nostrils 2 (two) times daily.     HUMALOG KWIKPEN 100 UNIT/ML KwikPen INJECT 15 TO 20 UNITS SUBCUTANEOUSLY THREE TIMES DAILY (Patient not taking: Reported on 10/08/2021) 15 mL 3   icosapent Ethyl (VASCEPA) 1 g capsule Take 2 capsules (2 g total) by mouth 2 (two) times daily. 360 capsule 2   insulin glargine (LANTUS) 100 UNIT/ML injection INJECT 60 TO 80 UNITS SUBCUTANEOUS AT BEDTIME (Patient not taking: Reported on 10/08/2021) 30 mL 6   Insulin Pen Needle (PEN NEEDLES) 32G X 5 MM MISC 1 Units by Does not apply route 3 (three) times daily. 100 each 2   Insulin Syringe-Needle U-100 25G X 1" 1 ML MISC Use as directed 100 each 3   JARDIANCE 25 MG TABS tablet TAKE  1 TABLET BY MOUTH ONCE DAILY BEFORE BREAKFAST 90 tablet 3   Magnesium 400 MG TABS Take by mouth.     metFORMIN (GLUCOPHAGE) 1000 MG tablet TAKE 1 TABLET BY MOUTH TWICE DAILY WITH A MEAL 180 tablet 3   Multiple Vitamin (MULTIVITAMIN) tablet Take 1 tablet by mouth daily.     POTASSIUM PO Take by mouth.     RELION PRIME TEST test strip USE AS DIRECTED THREE TIMES DAILY TO CHECK GLUCOSE 100 each 6   rosuvastatin (CRESTOR) 20 MG tablet Take 1 tablet (20 mg total) by mouth daily. 90 tablet 3   VICTOZA 18 MG/3ML SOPN INJECT 1.8 ML SUBCUTANEOUSLY  ONCE DAILY (Patient not taking: Reported on 10/08/2021) 9 mL 3   vitamin C (ASCORBIC ACID) 500 MG tablet Take 500 mg by mouth daily.     No current facility-administered medications on file prior to visit.   Allergies  Allergen Reactions   Codeine Itching    Most recent fall risk assessment:    10/08/2021    8:08 AM  Fall Risk   Falls in the past year? 0  Number falls in past yr: 0  Injury with Fall? 0  Risk for fall due to : No Fall Risks  Follow up Falls prevention discussed     Most recent depression screenings:    10/08/2021  8:08 AM 05/22/2020    8:03 AM  PHQ 2/9 Scores  PHQ - 2 Score 0 0    {VISON DENTAL STD PSA (Optional):27386}  {History (Optional):23778}  Patient Care Team: [name], MD as PCP - General (Family Medicine)   Outpatient Medications Prior to Visit  Medication Sig   aspirin EC 81 MG tablet Take 1 tablet (81 mg total) by mouth daily. Swallow whole.   calcium carbonate (OS-CAL) 600 MG tablet Take 600 mg by mouth daily.   fluticasone (FLONASE) 50 MCG/ACT nasal spray Place 1 spray into both nostrils 2 (two) times daily.   HUMALOG KWIKPEN 100 UNIT/ML KwikPen INJECT 15 TO 20 UNITS SUBCUTANEOUSLY THREE TIMES DAILY (Patient not taking: Reported on 10/08/2021)   icosapent Ethyl (VASCEPA) 1 g capsule Take 2 capsules (2 g total) by mouth 2 (two) times daily.   insulin glargine (LANTUS) 100 UNIT/ML injection INJECT 60  TO 80 UNITS SUBCUTANEOUS AT BEDTIME (Patient not taking: Reported on 10/08/2021)   Insulin Pen Needle (PEN NEEDLES) 32G X 5 MM MISC 1 Units by Does not apply route 3 (three) times daily.   Insulin Syringe-Needle U-100 25G X 1" 1 ML MISC Use as directed   JARDIANCE 25 MG TABS tablet TAKE 1 TABLET BY MOUTH ONCE DAILY BEFORE BREAKFAST   Magnesium 400 MG TABS Take by mouth.   metFORMIN (GLUCOPHAGE) 1000 MG tablet TAKE 1 TABLET BY MOUTH TWICE DAILY WITH A MEAL   Multiple Vitamin (MULTIVITAMIN) tablet Take 1 tablet by mouth daily.   POTASSIUM PO Take by mouth.   RELION PRIME TEST test strip USE AS DIRECTED THREE TIMES DAILY TO CHECK GLUCOSE   rosuvastatin (CRESTOR) 20 MG tablet Take 1 tablet (20 mg total) by mouth daily.   VICTOZA 18 MG/3ML SOPN INJECT 1.8 ML SUBCUTANEOUSLY  ONCE DAILY (Patient not taking: Reported on 10/08/2021)   vitamin C (ASCORBIC ACID) 500 MG tablet Take 500 mg by mouth daily.   No facility-administered medications prior to visit.    ROS        Objective:     LMP 04/25/2013  {Vitals History (Optional):23777}  Physical Exam   No results found for any visits on 10/18/21. {Show previous labs (optional):23779}    Assessment & Plan:    Routine Health Maintenance and Physical Exam  Immunization History  Administered Date(s) Administered   Influenza, Quadrivalent, Recombinant, Inj, Pf 12/06/2017   Influenza,inj,Quad PF,6+ Mos 10/28/2014, 10/11/2018   Influenza-Unspecified 12/20/2013, 10/06/2015, 12/06/2017, 10/11/2018, 11/04/2020   Moderna SARS-COV2 Booster Vaccination 07/02/2020   PFIZER(Purple Top)SARS-COV-2 Vaccination 04/12/2019, 05/03/2019, 01/30/2020   Pneumococcal Polysaccharide-23 05/23/2013   Tdap 03/27/2014   Zoster Recombinat (Shingrix) 07/02/2020, 09/22/2020    Health Maintenance  Topic Date Due   Diabetic kidney evaluation - Urine ACR  Never done   INFLUENZA VACCINE  01/28/2022 (Originally 08/31/2021)   FOOT EXAM  01/28/2022 (Originally 05/22/2021)    PAP SMEAR-Modifier  01/28/2022 (Originally 09/29/2012)   COVID-19 Vaccine (4 - Pfizer risk series) 01/28/2022 (Originally 08/27/2020)   HEMOGLOBIN A1C  04/08/2022   MAMMOGRAM  09/01/2022   OPHTHALMOLOGY EXAM  10/02/2022   Diabetic kidney evaluation - GFR measurement  10/09/2022   TETANUS/TDAP  03/27/2024   COLONOSCOPY (Pts 45-72yr Insurance coverage will need to be confirmed)  11/14/2028   Zoster Vaccines- Shingrix  Completed   HPV VACCINES  Aged Out   Hepatitis C Screening  Discontinued   HIV Screening  Discontinued    Discussed health benefits of physical activity, and encouraged her to engage  in regular exercise appropriate for her age and condition.  Problem List Items Addressed This Visit   None  No follow-ups on file.     Rubie Maid, FNP

## 2021-10-18 ENCOUNTER — Ambulatory Visit (INDEPENDENT_AMBULATORY_CARE_PROVIDER_SITE_OTHER): Payer: BC Managed Care – PPO | Admitting: Family Medicine

## 2021-10-18 ENCOUNTER — Other Ambulatory Visit: Payer: Self-pay | Admitting: Family Medicine

## 2021-10-18 ENCOUNTER — Encounter: Payer: Self-pay | Admitting: Family Medicine

## 2021-10-18 VITALS — BP 118/66 | HR 65 | Temp 98.0°F | Ht 67.0 in | Wt 205.0 lb

## 2021-10-18 DIAGNOSIS — R21 Rash and other nonspecific skin eruption: Secondary | ICD-10-CM | POA: Diagnosis not present

## 2021-10-18 DIAGNOSIS — Z124 Encounter for screening for malignant neoplasm of cervix: Secondary | ICD-10-CM

## 2021-10-18 DIAGNOSIS — Z1239 Encounter for other screening for malignant neoplasm of breast: Secondary | ICD-10-CM | POA: Diagnosis not present

## 2021-10-18 DIAGNOSIS — Z1231 Encounter for screening mammogram for malignant neoplasm of breast: Secondary | ICD-10-CM

## 2021-10-18 DIAGNOSIS — Z0000 Encounter for general adult medical examination without abnormal findings: Secondary | ICD-10-CM

## 2021-10-18 NOTE — Progress Notes (Signed)
Subjective:     Mckenzie Tyler is a 51 y.o. woman who comes in today for a  pap smear only. Her most recent annual exam was this month. Her most recent Pap smear was on 5+ years ago and showed no abnormalities. Previous abnormal Pap smears: no. Contraception: abstinence and post menopausal status. Patient's last menstrual period was 04/25/2013.   Mammogram 08/31/2020 showed no evidence of malignancy. Next screening scheduled in 2 weeks. Breasts: breasts appear normal, no suspicious masses, no skin or nipple changes or axillary nodes, risk and benefit of breast self-exam was discussed.  She is not sexually active and declines STD testing at this time.  She also reports a rash to her abdomen for 1 week, it does itch, she states it may be spreading due to her scratching it. She has tried steroid cream daily with mild improvement.   Past Medical History:  Diagnosis Date   Allergy    Depression    Diabetes mellitus type 2 with complications ([place])    Diabetes mellitus without complication ([place])    [facility]reesia 01/12/2020   Hyperlipidemia 02/13/2013   Smoker    Past Surgical History:  Procedure Laterality Date   EYE SURGERY N/A    [facility] 01/12/2020   RETINOPATHY SURGERY  11/16/2017   due to diabetes    TONSILLECTOMY AND ADENOIDECTOMY     age 45   Current Outpatient Medications on File Prior to Visit  Medication Sig Dispense Refill   aspirin EC 81 MG tablet Take 1 tablet (81 mg total) by mouth daily. Swallow whole. 90 tablet 3   calcium carbonate (OS-CAL) 600 MG tablet Take 600 mg by mouth daily.     fluticasone (FLONASE) 50 MCG/ACT nasal spray Place 1 spray into both nostrils 2 (two) times daily.     HUMALOG KWIKPEN 100 UNIT/ML KwikPen INJECT 15 TO 20 UNITS SUBCUTANEOUSLY THREE TIMES DAILY (Patient not taking: Reported on 10/08/2021) 15 mL 3   icosapent Ethyl (VASCEPA) 1 g capsule Take 2 capsules (2 g total) by mouth 2 (two) times daily. 360 capsule 2   insulin glargine (LANTUS) 100 UNIT/ML  injection INJECT 60 TO 80 UNITS SUBCUTANEOUS AT BEDTIME (Patient not taking: Reported on 10/08/2021) 30 mL 6   Insulin Pen Needle (PEN NEEDLES) 32G X 5 MM MISC 1 Units by Does not apply route 3 (three) times daily. 100 each 2   Insulin Syringe-Needle U-100 25G X 1" 1 ML MISC Use as directed 100 each 3   JARDIANCE 25 MG TABS tablet TAKE 1 TABLET BY MOUTH ONCE DAILY BEFORE BREAKFAST 90 tablet 3   Magnesium 400 MG TABS Take by mouth.     metFORMIN (GLUCOPHAGE) 1000 MG tablet TAKE 1 TABLET BY MOUTH TWICE DAILY WITH A MEAL 180 tablet 3   Multiple Vitamin (MULTIVITAMIN) tablet Take 1 tablet by mouth daily.     POTASSIUM PO Take by mouth.     RELION PRIME TEST test strip USE AS DIRECTED THREE TIMES DAILY TO CHECK GLUCOSE 100 each 6   rosuvastatin (CRESTOR) 20 MG tablet Take 1 tablet (20 mg total) by mouth daily. 90 tablet 3   VICTOZA 18 MG/3ML SOPN INJECT 1.8 ML SUBCUTANEOUSLY  ONCE DAILY (Patient not taking: Reported on 10/08/2021) 9 mL 3   vitamin C (ASCORBIC ACID) 500 MG tablet Take 500 mg by mouth daily.     No current facility-administered medications on file prior to visit.   Allergies  Allergen Reactions   Codeine Itching  Review of Systems Review of Systems  Constitutional: Negative.   Skin:  Positive for itching and rash.  All other systems reviewed and are negative.     Objective:    LMP 04/25/2013  Pelvic Exam: cervix normal in appearance, external genitalia normal, no adnexal masses or tenderness, no cervical motion tenderness, urethra without abnormality or discharge, and vagina normal without discharge. Pap smear obtained.   Assessment:    Screening pap smear.  Screening for malignant neoplasm of cervix - Plan: PAP, Thin Prep w/HPV rflx HPV Type 16/18, CANCELED: Cervical or vaginal cytopath, thin prep Cervical cytology with HPV done today, pending results will repeat in 5 years  Rash in adult Bathe with warm water and use fragrance-free soap Apply prescribed cream as  prescribed and follow with emollient or moisturized a few hours after Call back if rash spreads, becomes painful, or is not improved in another week.  Normal breast exam  Encounter for screening breast examination Mammogram already scheduled   Plan:    Follow up in 5 years, or as indicated by Pap results.

## 2021-10-18 NOTE — Patient Instructions (Signed)
Place pap smear patient instructions here.

## 2021-10-19 LAB — PAP, TP IMAGING W/ HPV RNA, RFLX HPV TYPE 16,18/45: HPV DNA High Risk: NOT DETECTED

## 2021-11-19 DIAGNOSIS — G4733 Obstructive sleep apnea (adult) (pediatric): Secondary | ICD-10-CM | POA: Diagnosis not present

## 2021-11-19 NOTE — Progress Notes (Unsigned)
Cardiology Office Note:    Date:  11/19/2021   ID:  [name], DOB 11/09/1970, MRN [id number]  PCP:  [name], MD   [facility] Providers Cardiologist:  None {  Referring MD: [name], MD    History of Present Illness:    Mckenzie Tyler is a 51 y.o. female with a hx of OSA on CPAP, DMII, and HLD who was referred by Dr. Dennard Schaumann for further evaluation of elevated Ca score.  Ca score 622. LAD 275, RCA 347. 99% for age, gender, race matched controls.  Today, she is doing well. She becomes short of breath after mild activity which she associates with her smoking history. She is able to walk up a flight of stairs. She denies chest pressure, dyspnea at rest, palpitations, PND, orthopnea, or leg swelling.  She endorses a constant chest pain spreading across her chest. The pain is not aggravated with exertion and is not relieved by rest. Symptoms occur all day long but can be worse at certain times. No associated symptoms.   She is a Pharmacist, [employer]. She admits to not being active and having a poor diet. Recently, she was started on fish oil. Currently, she does not take an aspirin regularly. Additionally, she takes insulin for diabetes type 2. 3 years ago, she quit smoking after 30 years.    Past Medical History:  Diagnosis Date   Allergy    Depression    Diabetes mellitus type 2 with complications ([place])    Diabetes mellitus without complication ([place])    Phreesia 01/12/2020   Hyperlipidemia 02/13/2013   Smoker     Past Surgical History:  Procedure Laterality Date   EYE SURGERY N/A    [facility]ia 01/12/2020   RETINOPATHY SURGERY  11/16/2017   due to diabetes    TONSILLECTOMY AND ADENOIDECTOMY     age 47    Current Medications: No outpatient medications have been marked as taking for the 11/22/21 encounter (Appointment) with Freada Bergeron, MD.     Allergies:   Codeine   Social History   Socioeconomic History   Marital status:  Single    Spouse name: Not on file   Number of children: Not on file   Years of education: Not on file   Highest education level: Not on file  Occupational History   Not on file  Tobacco Use   Smoking status: Former    Packs/day: 0.25    Types: Cigarettes    Quit date: 01/21/2018    Years since quitting: 3.8   Smokeless tobacco: Never   Tobacco comments:    has been trying to quit  Substance and Sexual Activity   Alcohol use: No    Alcohol/week: 0.0 standard drinks of alcohol   Drug use: No   Sexual activity: Yes    Birth control/protection: None  Other Topics Concern   Not on file  Social History Narrative   Not on file   Social Determinants of Health   Financial Resource Strain: Not on file  Food Insecurity: Not on file  Transportation Needs: Not on file  Physical Activity: Not on file  Stress: Not on file  Social Connections: Not on file     Family History: The patient's family history includes Diabetes in her father. There is no history of Cancer, Heart disease, Colon cancer, Colon polyps, Rectal cancer, or Stomach cancer.  ROS:   Please see the history of present illness.    Review  of Systems  Constitutional:  Negative for malaise/fatigue and weight loss.  HENT:  Negative for congestion and sore throat.   Eyes:  Negative for blurred vision.  Respiratory:  Positive for shortness of breath (exertional). Negative for cough.   Cardiovascular:  Positive for chest pain (central chest). Negative for palpitations, orthopnea, claudication, leg swelling and PND.  Gastrointestinal:  Negative for heartburn and nausea.  Genitourinary:  Negative for dysuria and urgency.  Musculoskeletal:  Negative for myalgias.  Skin:  Negative for itching and rash.  Neurological:  Negative for dizziness and headaches.  Endo/Heme/Allergies:  Does not bruise/bleed easily.  Psychiatric/Behavioral:  The patient is not nervous/anxious and does not have insomnia.    All other systems  reviewed and are negative.  EKGs/Labs/Other Studies Reviewed:    The following studies were reviewed today: Ca Score 03/2021: FINDINGS: Coronary arteries: Normal origins.   Coronary Calcium Score:   Left main: 0   Left anterior descending artery: 275   Left circumflex artery: 0   Right coronary artery: 347   Total: 622   Percentile: 99th   Pericardium: Normal.   Aorta: Normal caliber.  Atherosclerosis.   Heavy mitral annular calcification.   Non-cardiac: See separate report from [facility].   IMPRESSION: 1. Coronary calcium score of 622. This was 99th percentile for age-, race-, and sex-matched controls. 2. Aortic atherosclerosis. 3. Heavy mitral annular calcification. 4. Cardiology evaluation is recommended.    EKG:  04/22/21: NSR, HR 87 bpm  Recent Labs: 10/08/2021: ALT 29; BUN 17; Creat 0.87; Hemoglobin 15.1; Platelets 210; Potassium 4.4; Sodium 140  Recent Lipid Panel    Component Value Date/Time   CHOL 87 10/08/2021 0838   TRIG 195 (H) 10/08/2021 0838   HDL 26 (L) 10/08/2021 0838   CHOLHDL 3.3 10/08/2021 0838   VLDL 44 (H) 01/21/2016 1207   LDLCALC 34 10/08/2021 0838     Risk Assessment/Calculations:           Physical Exam:    VS:  LMP 04/25/2013     Wt Readings from Last 3 Encounters:  10/18/21 205 lb (93 kg)  10/08/21 203 lb (92.1 kg)  07/01/21 226 lb 9.6 oz (102.8 kg)     GEN: Well nourished, well developed in no acute distress HEENT: Normal NECK: No JVD; No carotid bruits LYMPHATICS: No lymphadenopathy CARDIAC: RRR, no murmurs, rubs, gallops RESPIRATORY:  Clear to auscultation without rales, wheezing or rhonchi  ABDOMEN: Soft, non-tender, non-distended MUSCULOSKELETAL:  No edema; No deformity  SKIN: Warm and dry NEUROLOGIC:  Alert and oriented x 3 PSYCHIATRIC:  Normal affect   ASSESSMENT:    No diagnosis found.  PLAN:    In order of problems listed above:  #CAD: Ca score 622 which is the 99% for age, gender,  race matched controls. No anginal symptoms and do not suspect her constant, nonexertional chest pain is related to coronary disease. Will plan for aggressive risk factor modification.  -Start ASA '81mg'$  daily -Change simva to crestor '20mg'$  daily -Start vascepa 2g BID -Lifestyle modifications as detailed below  #HLD: TG 377. HDL 81 in 03/2021.  -Change simva to crestor '20mg'$  daily -Start vascepa 2g BID -Can add fenofibrate if needed -Goal LDL<70 -Goal TG<150 -Repeat lipids in 6-8weeks  #DMII on Insulin: -Continue insulin -Continue jardiance -Continue metformin -A1C at goal at 6.7 -Lifestyle modifications as below  #Atypical Chest Pain: Do not suspect cardiac in nature as not exertional and constant in nature. Given risk factors and known CAD, will just check  TTE to ensure normal EF and no WMA. -Check TTE  #Obesity: BMI 35. Discussed diet and weight loss at length today. -Lifestyle modifications as detailed above  Exercise recommendations: Goal of exercising for at least 30 minutes a day, at least 5 times per week.  Please exercise to a moderate exertion.  This means that while exercising it is difficult to speak in full sentences, however you are not so short of breath that you feel you must stop, and not so comfortable that you can carry on a full conversation.  Exertion level should be approximately a 5/10, if 10 is the most exertion you can perform.  Diet recommendations: Recommend a heart healthy diet such as the Mediterranean diet.  This diet consists of plant based foods, healthy fats, lean meats, olive oil.  It suggests limiting the intake of simple carbohydrates such as white breads, pastries, and pastas.  It also limits the amount of red meat, wine, and dairy products such as cheese that one should consume on a daily basis.       Medication Adjustments/Labs and Tests Ordered: Current medicines are reviewed at length with the patient today.  Concerns regarding medicines are  outlined above.  No orders of the defined types were placed in this encounter.  No orders of the defined types were placed in this encounter.   There are no Patient Instructions on file for this visit.    I,Mykaella Javier,acting as a scribe for Freada Bergeron, MD.,have documented all relevant documentation on the behalf of Freada Bergeron, MD,as directed by  Freada Bergeron, MD while in the presence of Freada Bergeron, MD.  I, Freada Bergeron, MD, have reviewed all documentation for this visit. The documentation on 11/19/21 for the exam, diagnosis, procedures, and orders are all accurate and complete.   Signed, Freada Bergeron, MD  11/19/2021 8:45 AM    [place]

## 2021-11-22 ENCOUNTER — Encounter: Payer: Self-pay | Admitting: Cardiology

## 2021-11-22 ENCOUNTER — Ambulatory Visit: Payer: BC Managed Care – PPO | Attending: Cardiology | Admitting: Cardiology

## 2021-11-22 ENCOUNTER — Ambulatory Visit
Admission: RE | Admit: 2021-11-22 | Discharge: 2021-11-22 | Disposition: A | Discharge status: Home / Self Care | Payer: BC Managed Care – PPO | Source: Ambulatory Visit

## 2021-11-22 VITALS — BP 120/62 | HR 84 | Ht 67.0 in | Wt 191.2 lb

## 2021-11-22 DIAGNOSIS — E785 Hyperlipidemia, unspecified: Secondary | ICD-10-CM

## 2021-11-22 DIAGNOSIS — Z79899 Other long term (current) drug therapy: Secondary | ICD-10-CM | POA: Diagnosis not present

## 2021-11-22 DIAGNOSIS — I2510 Atherosclerotic heart disease of native coronary artery without angina pectoris: Secondary | ICD-10-CM | POA: Diagnosis not present

## 2021-11-22 DIAGNOSIS — E1169 Type 2 diabetes mellitus with other specified complication: Secondary | ICD-10-CM | POA: Diagnosis not present

## 2021-11-22 DIAGNOSIS — Z1231 Encounter for screening mammogram for malignant neoplasm of breast: Secondary | ICD-10-CM | POA: Diagnosis not present

## 2021-11-22 MED ORDER — ROSUVASTATIN CALCIUM 20 MG PO TABS: 20.0000 mg | ORAL_TABLET | Freq: Every day | ORAL | 4 refills | Status: DC

## 2021-11-22 MED ORDER — ICOSAPENT ETHYL 1 G PO CAPS: 2.0000 g | ORAL_CAPSULE | Freq: Two times a day (BID) | ORAL | 4 refills | Status: DC

## 2021-11-22 NOTE — Progress Notes (Signed)
Cardiology Office Note:    Date:  11/22/2021   ID:  [name], DOB Jan 26, 1971, MRN [id number]  PCP:  [name], MD   [facility] Providers Cardiologist:  None {  Referring MD: [name], MD    History of Present Illness:    Mckenzie Tyler is a 51 y.o. female with a hx of OSA on CPAP, DMII, HLD and elevated Ca score who presents to clinic for follow-u.  Ca score 622. LAD 275, RCA 347. 99% for age, gender, race matched controls.  Was initially seen in clinic on 03/2021 where she was having SOB with activity. TTE 05/2021 with LVEF 60-65%, mild LVH, normal diastology, normal RV, moderate MAC, borderline aortic dilation 30m.  Today, she says she is feeling amazing.  She states that any chest pain she experiences is very rare. She endorses it happens at random times.  She reports her breathing has been better, lately.   Her most recent LDL was 34. HDL was 26. Triglycerides were 195.  [facility] She has reduced her caffeine, sugar, and carb intake significantly. She mentions some constipation with this new diet.   For exercise, she stays active by walking her dog.   She has recently lost 40 lbs. She states she has much more energy than she used to.  She quit smoking 4 years ago.   She denies any palpitations or peripheral edema. No lightheadedness, headaches, syncope, orthopnea, or PND.  Past Medical History:  Diagnosis Date   Allergy    Depression    Diabetes mellitus type 2 with complications ([place]    Diabetes mellitus without complication ([place]    Phreesia 01/12/2020   Hyperlipidemia 02/13/2013   Smoker     Past Surgical History:  Procedure Laterality Date   EYE SURGERY N/A    Phreesia 01/12/2020   RETINOPATHY SURGERY  11/16/2017   due to diabetes    TONSILLECTOMY AND ADENOIDECTOMY     age 51   Current Medications: Current Meds  Medication Sig   aspirin EC 81 MG  tablet Take 1 tablet (81 mg total) by mouth daily. Swallow whole.   calcium carbonate (OS-CAL) 600 MG tablet Take 600 mg by mouth daily.   fluticasone (FLONASE) 50 MCG/ACT nasal spray Place 1 spray into both nostrils 2 (two) times daily.   JARDIANCE 25 MG TABS tablet TAKE 1 TABLET BY MOUTH ONCE DAILY BEFORE BREAKFAST   Magnesium 400 MG TABS Take by mouth.   metFORMIN (GLUCOPHAGE) 1000 MG tablet TAKE 1 TABLET BY MOUTH TWICE DAILY WITH A MEAL   Multiple Vitamin (MULTIVITAMIN) tablet Take 1 tablet by mouth daily.   POTASSIUM PO Take by mouth.   RELION PRIME TEST test strip USE AS DIRECTED THREE TIMES DAILY TO CHECK GLUCOSE   vitamin C (ASCORBIC ACID) 500 MG tablet Take 500 mg by mouth daily.   [DISCONTINUED] icosapent Ethyl (VASCEPA) 1 g capsule Take 2 capsules (2 g total) by mouth 2 (two) times daily.   [DISCONTINUED] rosuvastatin (CRESTOR) 20 MG tablet Take 1 tablet (20 mg total) by mouth daily.     Allergies:   Codeine   Social History   Socioeconomic History   Marital status: Single    Spouse name: Not on file   Number of children: Not on file   Years of education: Not on file   Highest education level: Not on file  Occupational History   Not  on file  Tobacco Use   Smoking status: Former    Packs/day: 0.25    Types: Cigarettes    Quit date: 01/21/2018    Years since quitting: 3.8   Smokeless tobacco: Never   Tobacco comments:    has been trying to quit  Substance and Sexual Activity   Alcohol use: No    Alcohol/week: 0.0 standard drinks of alcohol   Drug use: No   Sexual activity: Yes    Birth control/protection: None  Other Topics Concern   Not on file  Social History Narrative   Not on file   Social Determinants of Health   Financial Resource Strain: Not on file  Food Insecurity: Not on file  Transportation Needs: Not on file  Physical Activity: Not on file  Stress: Not on file  Social Connections: Not on file     Family History: The patient's family  history includes Diabetes in her father. There is no history of Cancer, Heart disease, Colon cancer, Colon polyps, Rectal cancer, or Stomach cancer.  ROS:   Please see the history of present illness.    Review of Systems  Constitutional:  Negative for malaise/fatigue and weight loss.  HENT:  Negative for congestion and sore throat.   Eyes:  Negative for blurred vision.  Respiratory:  Positive for shortness of breath (exertional). Negative for cough.   Cardiovascular:  Positive for chest pain (central chest). Negative for palpitations, orthopnea, claudication, leg swelling and PND.  Gastrointestinal:  Positive for constipation. Negative for heartburn and nausea.  Genitourinary:  Negative for dysuria and urgency.  Musculoskeletal:  Negative for myalgias.  Skin:  Negative for itching and rash.  Neurological:  Negative for dizziness and headaches.  Endo/Heme/Allergies:  Does not bruise/bleed easily.  Psychiatric/Behavioral:  The patient is not nervous/anxious and does not have insomnia.    All other systems reviewed and are negative.  EKGs/Labs/Other Studies Reviewed:    The following studies were reviewed today:  TTE 05/20/21: IMPRESSIONS     1. Left ventricular ejection fraction, by estimation, is 60 to 65%. The  left ventricle has normal function. The left ventricle has no regional  wall motion abnormalities. There is mild concentric left ventricular  hypertrophy. Left ventricular diastolic  parameters were normal.   2. Right ventricular systolic function is normal. The right ventricular  size is normal.   3. The mitral valve is normal in structure. No evidence of mitral valve  regurgitation. No evidence of mitral stenosis. Moderate mitral annular  calcification.   4. The aortic valve is normal in structure. Aortic valve regurgitation is  not visualized. No aortic stenosis is present.   5. There is borderline dilatation of the ascending aorta, measuring 36  mm.   6. The  inferior vena cava is normal in size with greater than 50%  respiratory variability, suggesting right atrial pressure of 3 mmHg.   Ca Score 03/2021: FINDINGS: Coronary arteries: Normal origins.   Coronary Calcium Score:   Left main: 0   Left anterior descending artery: 275   Left circumflex artery: 0   Right coronary artery: 347   Total: 622   Percentile: 99th   Pericardium: Normal.   Aorta: Normal caliber.  Atherosclerosis.   Heavy mitral annular calcification.   Non-cardiac: See separate report from [facility].   IMPRESSION: 1. Coronary calcium score of 622. This was 99th percentile for age-, race-, and sex-matched controls. 2. Aortic atherosclerosis. 3. Heavy mitral annular calcification. 4. Cardiology evaluation is recommended.  EKG: EKG is personally reviewed. 11/22/21: EKG was not ordered. 04/22/21: NSR, HR 87 bpm  Recent Labs: 10/08/2021: ALT 29; BUN 17; Creat 0.87; Hemoglobin 15.1; Platelets 210; Potassium 4.4; Sodium 140  Recent Lipid Panel    Component Value Date/Time   CHOL 87 10/08/2021 0838   TRIG 195 (H) 10/08/2021 0838   HDL 26 (L) 10/08/2021 0838   CHOLHDL 3.3 10/08/2021 0838   VLDL 44 (H) 01/21/2016 1207   LDLCALC 34 10/08/2021 0838     Risk Assessment/Calculations:           Physical Exam:    VS:  BP 120/62 (BP Location: Left Arm, Patient Position: Sitting, Cuff Size: Large)   Pulse 84   Ht _0  (1.702 m)   Wt 191 lb 3.2 oz (86.7 kg)   LMP 04/25/2013   SpO2 96%   BMI 29.95 kg/m     Wt Readings from Last 3 Encounters:  11/22/21 191 lb 3.2 oz (86.7 kg)  10/18/21 205 lb (93 kg)  10/08/21 203 lb (92.1 kg)     GEN: Well nourished, well developed in no acute distress HEENT: Normal NECK: No JVD; No carotid bruits LYMPHATICS: No lymphadenopathy CARDIAC: RRR, no murmurs, rubs, gallops RESPIRATORY:  Clear to auscultation without rales, wheezing or rhonchi  ABDOMEN: Soft, non-tender, non-distended MUSCULOSKELETAL:   No edema; No deformity  SKIN: Warm and dry NEUROLOGIC:  Alert and oriented x 3 PSYCHIATRIC:  Normal affect   ASSESSMENT:    1. Coronary artery disease involving native coronary artery of native heart without angina pectoris   2. Hyperlipidemia, unspecified hyperlipidemia type   3. Medication management   4. Type 2 diabetes mellitus with hyperlipidemia (HCC)     PLAN:    In order of problems listed above:  #CAD with elevated coronary Ca score: Ca score 622 which is the 99% for age, gender, race matched controls. TTE 98-92%, normal diastolic function, no significant valve disease. No anginal symptoms. Will continue with medical therapy and lifestyle modifications. -Continue ASA 54m daily -Continue crestor 261mdaily -Continue vascepa 2g BID -Lifestyle modifications as detailed below  #HLD: TG 377>195. LDL 81>34. Tolerating medications without issue. -Continue crestor 2064maily -Continue vascepa 2g BID  #DMII: -Now off all meds with weight loss  #Obesity: BMI 35>29. Lost 40lbs with weight loss clinic. -Follow-up with weight loss clinic -Lifestyle modifications as below  Exercise recommendations: Goal of exercising for at least 30 minutes a day, at least 5 times per week.  Please exercise to a moderate exertion.  This means that while exercising it is difficult to speak in full sentences, however you are not so short of breath that you feel you must stop, and not so comfortable that you can carry on a full conversation.  Exertion level should be approximately a 5/10, if 10 is the most exertion you can perform.  Diet recommendations: Recommend a heart healthy diet such as the Mediterranean diet.  This diet consists of plant based foods, healthy fats, lean meats, olive oil.  It suggests limiting the intake of simple carbohydrates such as white breads, pastries, and pastas.  It also limits the amount of red meat, wine, and dairy products such as cheese that one should consume on a  daily basis.      Follow up: 1 year.  Medication Adjustments/Labs and Tests Ordered: Current medicines are reviewed at length with the patient today.  Concerns regarding medicines are outlined above.  No orders of the defined types were placed in  this encounter.  Meds ordered this encounter  Medications   rosuvastatin (CRESTOR) 20 MG tablet    Sig: Take 1 tablet (20 mg total) by mouth daily.    Dispense:  90 tablet    Refill:  4   icosapent Ethyl (VASCEPA) 1 g capsule    Sig: Take 2 capsules (2 g total) by mouth 2 (two) times daily.    Dispense:  360 capsule    Refill:  4   Patient Instructions  Medication Instructions:   Your physician recommends that you continue on your current medications as directed. Please refer to the Current Medication list given to you today.  *If you need a refill on your cardiac medications before your next appointment, please call your pharmacy*   Follow-Up: At [facility], you and your health needs are our priority.  As part of our continuing mission to provide you with exceptional heart care, we have created designated Provider Care Teams.  These Care Teams include your primary Cardiologist (physician) and Advanced Practice Providers (APPs -  Physician Assistants and Nurse Practitioners) who all work together to provide you with the care you need, when you need it.  We recommend signing up for the patient portal called "MyChart".  Sign up information is provided on this After Visit Summary.  MyChart is used to connect with patients for Virtual Visits (Telemedicine).  Patients are able to view lab/test results, encounter notes, upcoming appointments, etc.  Non-urgent messages can be sent to your provider as well.   To learn more about what you can do with MyChart, go to [email or web address].    Your next appointment:   1 year(s)  The format for your next appointment:   In Person  Provider:   Dr. Johney Frame   Important Information  About Sugar         I,Breanna Adamick,acting as a scribe for Freada Bergeron, MD.,have documented all relevant documentation on the behalf of Freada Bergeron, MD,as directed by  Freada Bergeron, MD while in the presence of Freada Bergeron, MD.   I, Freada Bergeron, MD, have reviewed all documentation for this visit. The documentation on 11/22/21 for the exam, diagnosis, procedures, and orders are all accurate and complete.   Signed, Freada Bergeron, MD  11/22/2021 4:42 PM    [place]

## 2021-11-22 NOTE — Patient Instructions (Signed)
Medication Instructions:   Your physician recommends that you continue on your current medications as directed. Please refer to the Current Medication list given to you today.  *If you need a refill on your cardiac medications before your next appointment, please call your pharmacy*    Follow-Up: At [facility], you and your health needs are our priority.  As part of our continuing mission to provide you with exceptional heart care, we have created designated Provider Care Teams.  These Care Teams include your primary Cardiologist (physician) and Advanced Practice Providers (APPs -  Physician Assistants and Nurse Practitioners) who all work together to provide you with the care you need, when you need it.  We recommend signing up for the patient portal called "MyChart".  Sign up information is provided on this After Visit Summary.  MyChart is used to connect with patients for Virtual Visits (Telemedicine).  Patients are able to view lab/test results, encounter notes, upcoming appointments, etc.  Non-urgent messages can be sent to your provider as well.   To learn more about what you can do with MyChart, go to [email or web address].    Your next appointment:   1 year(s)  The format for your next appointment:   In Person  Provider:   Dr. Pemberton   Important Information About Sugar       

## 2021-11-23 DIAGNOSIS — M5442 Lumbago with sciatica, left side: Secondary | ICD-10-CM | POA: Diagnosis not present

## 2021-11-23 DIAGNOSIS — M47812 Spondylosis without myelopathy or radiculopathy, cervical region: Secondary | ICD-10-CM | POA: Diagnosis not present

## 2021-11-23 DIAGNOSIS — S63521A Sprain of radiocarpal joint of right wrist, initial encounter: Secondary | ICD-10-CM | POA: Diagnosis not present

## 2021-11-23 DIAGNOSIS — M62830 Muscle spasm of back: Secondary | ICD-10-CM | POA: Diagnosis not present

## 2021-11-24 DIAGNOSIS — M47812 Spondylosis without myelopathy or radiculopathy, cervical region: Secondary | ICD-10-CM | POA: Diagnosis not present

## 2021-11-24 DIAGNOSIS — S63521A Sprain of radiocarpal joint of right wrist, initial encounter: Secondary | ICD-10-CM | POA: Diagnosis not present

## 2021-11-24 DIAGNOSIS — M62830 Muscle spasm of back: Secondary | ICD-10-CM | POA: Diagnosis not present

## 2021-11-24 DIAGNOSIS — M9907 Segmental and somatic dysfunction of upper extremity: Secondary | ICD-10-CM | POA: Diagnosis not present

## 2021-11-24 DIAGNOSIS — M5442 Lumbago with sciatica, left side: Secondary | ICD-10-CM | POA: Diagnosis not present

## 2021-11-25 DIAGNOSIS — S63521A Sprain of radiocarpal joint of right wrist, initial encounter: Secondary | ICD-10-CM | POA: Diagnosis not present

## 2021-11-25 DIAGNOSIS — M62830 Muscle spasm of back: Secondary | ICD-10-CM | POA: Diagnosis not present

## 2021-11-25 DIAGNOSIS — M9907 Segmental and somatic dysfunction of upper extremity: Secondary | ICD-10-CM | POA: Diagnosis not present

## 2021-11-25 DIAGNOSIS — M47812 Spondylosis without myelopathy or radiculopathy, cervical region: Secondary | ICD-10-CM | POA: Diagnosis not present

## 2021-11-25 DIAGNOSIS — M5442 Lumbago with sciatica, left side: Secondary | ICD-10-CM | POA: Diagnosis not present

## 2021-11-26 DIAGNOSIS — M47812 Spondylosis without myelopathy or radiculopathy, cervical region: Secondary | ICD-10-CM | POA: Diagnosis not present

## 2021-11-26 DIAGNOSIS — S63521A Sprain of radiocarpal joint of right wrist, initial encounter: Secondary | ICD-10-CM | POA: Diagnosis not present

## 2021-11-26 DIAGNOSIS — M62830 Muscle spasm of back: Secondary | ICD-10-CM | POA: Diagnosis not present

## 2021-11-26 DIAGNOSIS — M9907 Segmental and somatic dysfunction of upper extremity: Secondary | ICD-10-CM | POA: Diagnosis not present

## 2021-11-26 DIAGNOSIS — M5442 Lumbago with sciatica, left side: Secondary | ICD-10-CM | POA: Diagnosis not present

## 2021-11-30 DIAGNOSIS — M62830 Muscle spasm of back: Secondary | ICD-10-CM | POA: Diagnosis not present

## 2021-11-30 DIAGNOSIS — M47812 Spondylosis without myelopathy or radiculopathy, cervical region: Secondary | ICD-10-CM | POA: Diagnosis not present

## 2021-11-30 DIAGNOSIS — M5442 Lumbago with sciatica, left side: Secondary | ICD-10-CM | POA: Diagnosis not present

## 2021-11-30 DIAGNOSIS — S63521A Sprain of radiocarpal joint of right wrist, initial encounter: Secondary | ICD-10-CM | POA: Diagnosis not present

## 2021-11-30 DIAGNOSIS — M9907 Segmental and somatic dysfunction of upper extremity: Secondary | ICD-10-CM | POA: Diagnosis not present

## 2021-12-01 DIAGNOSIS — M5442 Lumbago with sciatica, left side: Secondary | ICD-10-CM | POA: Diagnosis not present

## 2021-12-01 DIAGNOSIS — M47812 Spondylosis without myelopathy or radiculopathy, cervical region: Secondary | ICD-10-CM | POA: Diagnosis not present

## 2021-12-01 DIAGNOSIS — M62830 Muscle spasm of back: Secondary | ICD-10-CM | POA: Diagnosis not present

## 2021-12-01 DIAGNOSIS — S63521A Sprain of radiocarpal joint of right wrist, initial encounter: Secondary | ICD-10-CM | POA: Diagnosis not present

## 2021-12-01 DIAGNOSIS — M9907 Segmental and somatic dysfunction of upper extremity: Secondary | ICD-10-CM | POA: Diagnosis not present

## 2021-12-03 DIAGNOSIS — M47812 Spondylosis without myelopathy or radiculopathy, cervical region: Secondary | ICD-10-CM | POA: Diagnosis not present

## 2021-12-03 DIAGNOSIS — M5442 Lumbago with sciatica, left side: Secondary | ICD-10-CM | POA: Diagnosis not present

## 2021-12-03 DIAGNOSIS — S63521A Sprain of radiocarpal joint of right wrist, initial encounter: Secondary | ICD-10-CM | POA: Diagnosis not present

## 2021-12-03 DIAGNOSIS — M9907 Segmental and somatic dysfunction of upper extremity: Secondary | ICD-10-CM | POA: Diagnosis not present

## 2021-12-03 DIAGNOSIS — M62830 Muscle spasm of back: Secondary | ICD-10-CM | POA: Diagnosis not present

## 2021-12-06 DIAGNOSIS — S63521A Sprain of radiocarpal joint of right wrist, initial encounter: Secondary | ICD-10-CM | POA: Diagnosis not present

## 2021-12-06 DIAGNOSIS — M9907 Segmental and somatic dysfunction of upper extremity: Secondary | ICD-10-CM | POA: Diagnosis not present

## 2021-12-06 DIAGNOSIS — M5442 Lumbago with sciatica, left side: Secondary | ICD-10-CM | POA: Diagnosis not present

## 2021-12-06 DIAGNOSIS — M62830 Muscle spasm of back: Secondary | ICD-10-CM | POA: Diagnosis not present

## 2021-12-06 DIAGNOSIS — M47812 Spondylosis without myelopathy or radiculopathy, cervical region: Secondary | ICD-10-CM | POA: Diagnosis not present

## 2021-12-07 DIAGNOSIS — M62830 Muscle spasm of back: Secondary | ICD-10-CM | POA: Diagnosis not present

## 2021-12-07 DIAGNOSIS — S63521A Sprain of radiocarpal joint of right wrist, initial encounter: Secondary | ICD-10-CM | POA: Diagnosis not present

## 2021-12-07 DIAGNOSIS — M9907 Segmental and somatic dysfunction of upper extremity: Secondary | ICD-10-CM | POA: Diagnosis not present

## 2021-12-07 DIAGNOSIS — M47812 Spondylosis without myelopathy or radiculopathy, cervical region: Secondary | ICD-10-CM | POA: Diagnosis not present

## 2021-12-07 DIAGNOSIS — M5442 Lumbago with sciatica, left side: Secondary | ICD-10-CM | POA: Diagnosis not present

## 2021-12-10 DIAGNOSIS — M62830 Muscle spasm of back: Secondary | ICD-10-CM | POA: Diagnosis not present

## 2021-12-10 DIAGNOSIS — M5442 Lumbago with sciatica, left side: Secondary | ICD-10-CM | POA: Diagnosis not present

## 2021-12-10 DIAGNOSIS — M9907 Segmental and somatic dysfunction of upper extremity: Secondary | ICD-10-CM | POA: Diagnosis not present

## 2021-12-10 DIAGNOSIS — S63521A Sprain of radiocarpal joint of right wrist, initial encounter: Secondary | ICD-10-CM | POA: Diagnosis not present

## 2021-12-10 DIAGNOSIS — M47812 Spondylosis without myelopathy or radiculopathy, cervical region: Secondary | ICD-10-CM | POA: Diagnosis not present

## 2021-12-14 DIAGNOSIS — M47812 Spondylosis without myelopathy or radiculopathy, cervical region: Secondary | ICD-10-CM | POA: Diagnosis not present

## 2021-12-14 DIAGNOSIS — S63521A Sprain of radiocarpal joint of right wrist, initial encounter: Secondary | ICD-10-CM | POA: Diagnosis not present

## 2021-12-14 DIAGNOSIS — M5442 Lumbago with sciatica, left side: Secondary | ICD-10-CM | POA: Diagnosis not present

## 2021-12-14 DIAGNOSIS — M62830 Muscle spasm of back: Secondary | ICD-10-CM | POA: Diagnosis not present

## 2021-12-14 DIAGNOSIS — M9907 Segmental and somatic dysfunction of upper extremity: Secondary | ICD-10-CM | POA: Diagnosis not present

## 2021-12-16 DIAGNOSIS — M62830 Muscle spasm of back: Secondary | ICD-10-CM | POA: Diagnosis not present

## 2021-12-16 DIAGNOSIS — M5442 Lumbago with sciatica, left side: Secondary | ICD-10-CM | POA: Diagnosis not present

## 2021-12-16 DIAGNOSIS — M9907 Segmental and somatic dysfunction of upper extremity: Secondary | ICD-10-CM | POA: Diagnosis not present

## 2021-12-16 DIAGNOSIS — M47812 Spondylosis without myelopathy or radiculopathy, cervical region: Secondary | ICD-10-CM | POA: Diagnosis not present

## 2021-12-16 DIAGNOSIS — S63521A Sprain of radiocarpal joint of right wrist, initial encounter: Secondary | ICD-10-CM | POA: Diagnosis not present

## 2021-12-17 DIAGNOSIS — M62830 Muscle spasm of back: Secondary | ICD-10-CM | POA: Diagnosis not present

## 2021-12-17 DIAGNOSIS — S63521A Sprain of radiocarpal joint of right wrist, initial encounter: Secondary | ICD-10-CM | POA: Diagnosis not present

## 2021-12-17 DIAGNOSIS — M9907 Segmental and somatic dysfunction of upper extremity: Secondary | ICD-10-CM | POA: Diagnosis not present

## 2021-12-17 DIAGNOSIS — M5442 Lumbago with sciatica, left side: Secondary | ICD-10-CM | POA: Diagnosis not present

## 2021-12-17 DIAGNOSIS — M47812 Spondylosis without myelopathy or radiculopathy, cervical region: Secondary | ICD-10-CM | POA: Diagnosis not present

## 2021-12-20 DIAGNOSIS — G4733 Obstructive sleep apnea (adult) (pediatric): Secondary | ICD-10-CM | POA: Diagnosis not present

## 2021-12-21 DIAGNOSIS — M9903 Segmental and somatic dysfunction of lumbar region: Secondary | ICD-10-CM | POA: Diagnosis not present

## 2021-12-21 DIAGNOSIS — M62830 Muscle spasm of back: Secondary | ICD-10-CM | POA: Diagnosis not present

## 2021-12-21 DIAGNOSIS — S63521A Sprain of radiocarpal joint of right wrist, initial encounter: Secondary | ICD-10-CM | POA: Diagnosis not present

## 2021-12-21 DIAGNOSIS — M47812 Spondylosis without myelopathy or radiculopathy, cervical region: Secondary | ICD-10-CM | POA: Diagnosis not present

## 2021-12-21 DIAGNOSIS — M9901 Segmental and somatic dysfunction of cervical region: Secondary | ICD-10-CM | POA: Diagnosis not present

## 2021-12-21 DIAGNOSIS — M5442 Lumbago with sciatica, left side: Secondary | ICD-10-CM | POA: Diagnosis not present

## 2021-12-28 DIAGNOSIS — M9901 Segmental and somatic dysfunction of cervical region: Secondary | ICD-10-CM | POA: Diagnosis not present

## 2021-12-28 DIAGNOSIS — S63521A Sprain of radiocarpal joint of right wrist, initial encounter: Secondary | ICD-10-CM | POA: Diagnosis not present

## 2021-12-28 DIAGNOSIS — M62830 Muscle spasm of back: Secondary | ICD-10-CM | POA: Diagnosis not present

## 2021-12-28 DIAGNOSIS — M47812 Spondylosis without myelopathy or radiculopathy, cervical region: Secondary | ICD-10-CM | POA: Diagnosis not present

## 2021-12-28 DIAGNOSIS — M9903 Segmental and somatic dysfunction of lumbar region: Secondary | ICD-10-CM | POA: Diagnosis not present

## 2021-12-28 DIAGNOSIS — M5442 Lumbago with sciatica, left side: Secondary | ICD-10-CM | POA: Diagnosis not present

## 2022-01-04 DIAGNOSIS — M62830 Muscle spasm of back: Secondary | ICD-10-CM | POA: Diagnosis not present

## 2022-01-04 DIAGNOSIS — S63521A Sprain of radiocarpal joint of right wrist, initial encounter: Secondary | ICD-10-CM | POA: Diagnosis not present

## 2022-01-04 DIAGNOSIS — M47812 Spondylosis without myelopathy or radiculopathy, cervical region: Secondary | ICD-10-CM | POA: Diagnosis not present

## 2022-01-04 DIAGNOSIS — M9901 Segmental and somatic dysfunction of cervical region: Secondary | ICD-10-CM | POA: Diagnosis not present

## 2022-01-04 DIAGNOSIS — M5442 Lumbago with sciatica, left side: Secondary | ICD-10-CM | POA: Diagnosis not present

## 2022-01-04 DIAGNOSIS — M9903 Segmental and somatic dysfunction of lumbar region: Secondary | ICD-10-CM | POA: Diagnosis not present

## 2022-01-07 DIAGNOSIS — M9901 Segmental and somatic dysfunction of cervical region: Secondary | ICD-10-CM | POA: Diagnosis not present

## 2022-01-07 DIAGNOSIS — M5442 Lumbago with sciatica, left side: Secondary | ICD-10-CM | POA: Diagnosis not present

## 2022-01-07 DIAGNOSIS — M9903 Segmental and somatic dysfunction of lumbar region: Secondary | ICD-10-CM | POA: Diagnosis not present

## 2022-01-07 DIAGNOSIS — S63521A Sprain of radiocarpal joint of right wrist, initial encounter: Secondary | ICD-10-CM | POA: Diagnosis not present

## 2022-01-07 DIAGNOSIS — M62830 Muscle spasm of back: Secondary | ICD-10-CM | POA: Diagnosis not present

## 2022-01-07 DIAGNOSIS — M47812 Spondylosis without myelopathy or radiculopathy, cervical region: Secondary | ICD-10-CM | POA: Diagnosis not present

## 2022-01-11 DIAGNOSIS — M62830 Muscle spasm of back: Secondary | ICD-10-CM | POA: Diagnosis not present

## 2022-01-11 DIAGNOSIS — M47812 Spondylosis without myelopathy or radiculopathy, cervical region: Secondary | ICD-10-CM | POA: Diagnosis not present

## 2022-01-11 DIAGNOSIS — M9901 Segmental and somatic dysfunction of cervical region: Secondary | ICD-10-CM | POA: Diagnosis not present

## 2022-01-11 DIAGNOSIS — M5442 Lumbago with sciatica, left side: Secondary | ICD-10-CM | POA: Diagnosis not present

## 2022-01-11 DIAGNOSIS — S63521A Sprain of radiocarpal joint of right wrist, initial encounter: Secondary | ICD-10-CM | POA: Diagnosis not present

## 2022-01-11 DIAGNOSIS — M9903 Segmental and somatic dysfunction of lumbar region: Secondary | ICD-10-CM | POA: Diagnosis not present

## 2022-01-13 DIAGNOSIS — M9903 Segmental and somatic dysfunction of lumbar region: Secondary | ICD-10-CM | POA: Diagnosis not present

## 2022-01-13 DIAGNOSIS — M5442 Lumbago with sciatica, left side: Secondary | ICD-10-CM | POA: Diagnosis not present

## 2022-01-13 DIAGNOSIS — S63521A Sprain of radiocarpal joint of right wrist, initial encounter: Secondary | ICD-10-CM | POA: Diagnosis not present

## 2022-01-13 DIAGNOSIS — M62830 Muscle spasm of back: Secondary | ICD-10-CM | POA: Diagnosis not present

## 2022-01-13 DIAGNOSIS — M9901 Segmental and somatic dysfunction of cervical region: Secondary | ICD-10-CM | POA: Diagnosis not present

## 2022-01-13 DIAGNOSIS — M47812 Spondylosis without myelopathy or radiculopathy, cervical region: Secondary | ICD-10-CM | POA: Diagnosis not present

## 2022-01-18 DIAGNOSIS — S63521A Sprain of radiocarpal joint of right wrist, initial encounter: Secondary | ICD-10-CM | POA: Diagnosis not present

## 2022-01-18 DIAGNOSIS — H35371 Puckering of macula, right eye: Secondary | ICD-10-CM | POA: Diagnosis not present

## 2022-01-18 DIAGNOSIS — H43822 Vitreomacular adhesion, left eye: Secondary | ICD-10-CM | POA: Diagnosis not present

## 2022-01-18 DIAGNOSIS — M9901 Segmental and somatic dysfunction of cervical region: Secondary | ICD-10-CM | POA: Diagnosis not present

## 2022-01-18 DIAGNOSIS — E113513 Type 2 diabetes mellitus with proliferative diabetic retinopathy with macular edema, bilateral: Secondary | ICD-10-CM | POA: Diagnosis not present

## 2022-01-18 DIAGNOSIS — M5442 Lumbago with sciatica, left side: Secondary | ICD-10-CM | POA: Diagnosis not present

## 2022-01-18 DIAGNOSIS — M62830 Muscle spasm of back: Secondary | ICD-10-CM | POA: Diagnosis not present

## 2022-01-18 DIAGNOSIS — M47812 Spondylosis without myelopathy or radiculopathy, cervical region: Secondary | ICD-10-CM | POA: Diagnosis not present

## 2022-01-18 DIAGNOSIS — M9903 Segmental and somatic dysfunction of lumbar region: Secondary | ICD-10-CM | POA: Diagnosis not present

## 2022-01-19 DIAGNOSIS — G4733 Obstructive sleep apnea (adult) (pediatric): Secondary | ICD-10-CM | POA: Diagnosis not present

## 2022-01-20 DIAGNOSIS — M9903 Segmental and somatic dysfunction of lumbar region: Secondary | ICD-10-CM | POA: Diagnosis not present

## 2022-01-20 DIAGNOSIS — M47812 Spondylosis without myelopathy or radiculopathy, cervical region: Secondary | ICD-10-CM | POA: Diagnosis not present

## 2022-01-20 DIAGNOSIS — S63521A Sprain of radiocarpal joint of right wrist, initial encounter: Secondary | ICD-10-CM | POA: Diagnosis not present

## 2022-01-20 DIAGNOSIS — M9901 Segmental and somatic dysfunction of cervical region: Secondary | ICD-10-CM | POA: Diagnosis not present

## 2022-01-20 DIAGNOSIS — M5442 Lumbago with sciatica, left side: Secondary | ICD-10-CM | POA: Diagnosis not present

## 2022-01-20 DIAGNOSIS — M62830 Muscle spasm of back: Secondary | ICD-10-CM | POA: Diagnosis not present

## 2022-03-01 ENCOUNTER — Encounter: Payer: Self-pay | Admitting: Family Medicine

## 2022-03-10 ENCOUNTER — Encounter: Payer: Self-pay | Admitting: Family Medicine

## 2022-03-10 ENCOUNTER — Ambulatory Visit (INDEPENDENT_AMBULATORY_CARE_PROVIDER_SITE_OTHER): Payer: BC Managed Care – PPO | Admitting: Family Medicine

## 2022-03-10 VITALS — BP 120/70 | HR 73 | Temp 98.2°F | Ht 67.0 in | Wt 174.0 lb

## 2022-03-10 DIAGNOSIS — I10 Essential (primary) hypertension: Secondary | ICD-10-CM | POA: Diagnosis not present

## 2022-03-10 DIAGNOSIS — E118 Type 2 diabetes mellitus with unspecified complications: Secondary | ICD-10-CM | POA: Diagnosis not present

## 2022-03-10 DIAGNOSIS — L659 Nonscarring hair loss, unspecified: Secondary | ICD-10-CM | POA: Diagnosis not present

## 2022-03-10 NOTE — Progress Notes (Signed)
Subjective:    Patient ID: Mckenzie Tyler, female    DOB: 1970-09-20, 52 y.o.   MRN: [id number]  Patient is a very pleasant 52 year old Caucasian female with a history of type 2 diabetes mellitus.   She continues to lose weight through diet and exercise.  She is lost almost 30 pounds since last year.  She has been able to reduce her medicines just to metformin and her sugars remain well-controlled per her report.  She denies any hypoglycemic episodes.  However she does report thinning of her hair and androgenic pattern specifically in the center.   Past Medical History:  Diagnosis Date   Allergy    Depression    Diabetes mellitus type 2 with complications ([place])    Diabetes mellitus without complication ([employer])    Phreesia 01/12/2020   Hyperlipidemia 02/13/2013   Smoker    Past Surgical History:  Procedure Laterality Date   EYE SURGERY N/A    Phreesia 01/12/2020   RETINOPATHY SURGERY  11/16/2017   due to diabetes    TONSILLECTOMY AND ADENOIDECTOMY     age 67   Current Outpatient Medications on File Prior to Visit  Medication Sig Dispense Refill   aspirin EC 81 MG tablet Take 1 tablet (81 mg total) by mouth daily. Swallow whole. 90 tablet 3   calcium carbonate (OS-CAL) 600 MG tablet Take 600 mg by mouth daily.     fluticasone (FLONASE) 50 MCG/ACT nasal spray Place 1 spray into both nostrils 2 (two) times daily.     icosapent Ethyl (VASCEPA) 1 g capsule Take 2 capsules (2 g total) by mouth 2 (two) times daily. 360 capsule 4   Magnesium 400 MG TABS Take by mouth.     metFORMIN (GLUCOPHAGE) 1000 MG tablet TAKE 1 TABLET BY MOUTH TWICE DAILY WITH A MEAL 180 tablet 3   Multiple Vitamin (MULTIVITAMIN) tablet Take 1 tablet by mouth daily.     POTASSIUM PO Take by mouth.     RELION PRIME TEST test strip USE AS DIRECTED THREE TIMES DAILY TO CHECK GLUCOSE 100 each 6   rosuvastatin (CRESTOR) 20 MG tablet Take 1 tablet (20 mg total) by mouth daily. 90 tablet 4   vitamin C (ASCORBIC ACID) 500 MG  tablet Take 500 mg by mouth daily.     No current facility-administered medications on file prior to visit.   Allergies  Allergen Reactions   Codeine Itching   Social History   Socioeconomic History   Marital status: Single    Spouse name: Not on file   Number of children: Not on file   Years of education: Not on file   Highest education level: Not on file  Occupational History   Not on file  Tobacco Use   Smoking status: Former    Packs/day: 0.25    Types: Cigarettes    Quit date: 01/21/2018    Years since quitting: 4.1   Smokeless tobacco: Never   Tobacco comments:    has been trying to quit  Substance and Sexual Activity   Alcohol use: No    Alcohol/week: 0.0 standard drinks of alcohol   Drug use: No   Sexual activity: Yes    Birth control/protection: None  Other Topics Concern   Not on file  Social History Narrative   Not on file   Social Determinants of Health   Financial Resource Strain: Not on file  Food Insecurity: Not on file  Transportation Needs: Not on file  Physical Activity: Not  on file  Stress: Not on file  Social Connections: Not on file  Intimate Partner Violence: Not on file      Review of Systems  All other systems reviewed and are negative.      Objective:   Physical Exam Vitals reviewed.  Constitutional:      General: She is not in acute distress.    Appearance: She is not ill-appearing.  Cardiovascular:     Rate and Rhythm: Normal rate and regular rhythm.     Pulses: Normal pulses.     Heart sounds: Normal heart sounds. No murmur heard.    No gallop.  Pulmonary:     Effort: Pulmonary effort is normal. No respiratory distress.     Breath sounds: Normal breath sounds. No stridor. No wheezing, rhonchi or rales.  Chest:     Chest wall: No tenderness.  Abdominal:     General: Bowel sounds are normal. There is no distension.     Tenderness: There is no guarding or rebound.     Hernia: No hernia is present.  Musculoskeletal:      Right lower leg: No edema.     Left lower leg: No edema.  Neurological:     Mental Status: She is alert.        Assessment & Plan:  Benign essential HTN  Type 2 diabetes mellitus with complication (HCC) - Plan: Hemoglobin A1c, CBC with Differential/Platelet, COMPLETE METABOLIC PANEL WITH GFR, Lipid panel, Protein / Creatinine Ratio, Urine  Alopecia - Plan: TSH I am very proud of the patient for losing weight.  I encouraged her to keep it up.  Her blood pressure is outstanding.  I will check a hemoglobin A1c.  Goal hemoglobin A1c is less than 6.5.  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  We discussed switching metformin to a GLP-1 agonist however she declines this at the present time

## 2022-03-11 LAB — COMPLETE METABOLIC PANEL WITH GFR
AG Ratio: 2.3 (calc) (ref 1.0–2.5)
ALT: 15 U/L (ref 6–29)
AST: 18 U/L (ref 10–35)
Albumin: 4.8 g/dL (ref 3.6–5.1)
Alkaline phosphatase (APISO): 46 U/L (ref 37–153)
BUN: 15 mg/dL (ref 7–25)
CO2: 29 mmol/L (ref 20–32)
Calcium: 9.7 mg/dL (ref 8.6–10.4)
Chloride: 103 mmol/L (ref 98–110)
Creat: 0.72 mg/dL (ref 0.50–1.03)
Globulin: 2.1 g/dL (calc) (ref 1.9–3.7)
Glucose, Bld: 139 mg/dL — ABNORMAL HIGH (ref 65–99)
Potassium: 4.2 mmol/L (ref 3.5–5.3)
Sodium: 143 mmol/L (ref 135–146)
Total Bilirubin: 0.7 mg/dL (ref 0.2–1.2)
Total Protein: 6.9 g/dL (ref 6.1–8.1)
eGFR: 100 mL/min/{1.73_m2} (ref >=60)

## 2022-03-11 LAB — CBC WITH DIFFERENTIAL/PLATELET
Absolute Monocytes: 380 cells/uL (ref 200–950)
Basophils Absolute: 30 cells/uL (ref 0–200)
Basophils Relative: 0.8 %
Eosinophils Absolute: 293 cells/uL (ref 15–500)
Eosinophils Relative: 7.7 %
HCT: 40.2 % (ref 35.0–45.0)
Hemoglobin: 13.9 g/dL (ref 11.7–15.5)
Lymphs Abs: 984 cells/uL (ref 850–3900)
MCH: 31.8 pg (ref 27.0–33.0)
MCHC: 34.6 g/dL (ref 32.0–36.0)
MCV: 92 fL (ref 80.0–100.0)
MPV: 10 fL (ref 7.5–12.5)
Monocytes Relative: 10 %
Neutro Abs: 2113 cells/uL (ref 1500–7800)
Neutrophils Relative %: 55.6 %
Platelets: 185 10*3/uL (ref 140–400)
RBC: 4.37 10*6/uL (ref 3.80–5.10)
RDW: 12.8 % (ref 11.0–15.0)
Total Lymphocyte: 25.9 %
WBC: 3.8 10*3/uL (ref 3.8–10.8)

## 2022-03-11 LAB — LIPID PANEL
Cholesterol: 105 mg/dL (ref <200)
HDL: 37 mg/dL — ABNORMAL LOW (ref >=50)
LDL Cholesterol (Calc): 49 mg/dL (calc)
Non-HDL Cholesterol (Calc): 68 mg/dL (calc) (ref <130)
Total CHOL/HDL Ratio: 2.8 (calc) (ref <5.0)
Triglycerides: 100 mg/dL (ref <150)

## 2022-03-11 LAB — PROTEIN / CREATININE RATIO, URINE
Creatinine, Urine: 84 mg/dL (ref 20–275)
Protein/Creat Ratio: 60 mg/g creat (ref 24–184)
Protein/Creatinine Ratio: 0.06 mg/mg creat (ref 0.024–0.184)
Total Protein, Urine: 5 mg/dL (ref 5–24)

## 2022-03-11 LAB — HEMOGLOBIN A1C
Hgb A1c MFr Bld: 6.4 % of total Hgb — ABNORMAL HIGH (ref <5.7)
Mean Plasma Glucose: 137 mg/dL
eAG (mmol/L): 7.6 mmol/L

## 2022-03-11 LAB — TSH: TSH: 1.95 mIU/L

## 2022-03-31 ENCOUNTER — Other Ambulatory Visit: Payer: Self-pay | Admitting: Family Medicine

## 2022-03-31 ENCOUNTER — Other Ambulatory Visit: Payer: Self-pay

## 2022-03-31 MED ORDER — RELION PREMIER TEST VI STRP: ORAL_STRIP | 12 refills | Status: DC

## 2022-04-05 ENCOUNTER — Other Ambulatory Visit: Payer: Self-pay

## 2022-04-05 ENCOUNTER — Telehealth: Payer: Self-pay | Admitting: Family Medicine

## 2022-04-05 DIAGNOSIS — E118 Type 2 diabetes mellitus with unspecified complications: Secondary | ICD-10-CM

## 2022-04-05 MED ORDER — RELION BLOOD GLUCOSE TEST VI STRP: ORAL_STRIP | 12 refills | Status: DC

## 2022-04-05 NOTE — Telephone Encounter (Signed)
Pharmacy sent eFax to follow up on refill request for  RELION PREMIER TEST STRIP QTY: 100  FORM: EACH REFILLS: 12  Pharmacy:   [facility] [address], [place] - [id number] [place] [place] [address] Phone: [phone]  Fax: [phone]   **Pharmacist needs # of times per day patient can test to bill insurance**.  Requesting call back. Please advise pharmacist at [phone].

## 2022-04-08 ENCOUNTER — Ambulatory Visit: Payer: BC Managed Care – PPO | Admitting: Family Medicine

## 2022-05-13 DIAGNOSIS — H35371 Puckering of macula, right eye: Secondary | ICD-10-CM | POA: Diagnosis not present

## 2022-05-13 DIAGNOSIS — H43822 Vitreomacular adhesion, left eye: Secondary | ICD-10-CM | POA: Diagnosis not present

## 2022-05-13 DIAGNOSIS — E113513 Type 2 diabetes mellitus with proliferative diabetic retinopathy with macular edema, bilateral: Secondary | ICD-10-CM | POA: Diagnosis not present

## 2022-05-17 ENCOUNTER — Ambulatory Visit (HOSPITAL_COMMUNITY): Payer: BC Managed Care – PPO | Attending: Internal Medicine

## 2022-05-17 DIAGNOSIS — I77810 Thoracic aortic ectasia: Secondary | ICD-10-CM

## 2022-05-17 DIAGNOSIS — I10 Essential (primary) hypertension: Secondary | ICD-10-CM | POA: Diagnosis not present

## 2022-05-17 LAB — ECHOCARDIOGRAM COMPLETE
Area-P 1/2: 2.58 cm2
S' Lateral: 2.1 cm

## 2022-05-30 ENCOUNTER — Telehealth: Payer: Self-pay

## 2022-05-30 NOTE — Progress Notes (Deleted)
PATIENT: Mckenzie Tyler DOB: [birth year]  REASON FOR VISIT: follow up HISTORY FROM: patient  Virtual Visit via Telephone Note  I connected with Mckenzie Tyler on 05/30/22 at  8:30 AM EDT by telephone and verified that I am speaking with the correct person using two identifiers.   I discussed the limitations, risks, security and privacy concerns of performing an evaluation and management service by telephone and the availability of in person appointments. I also discussed with the patient that there may be a patient responsible charge related to this service. The patient expressed understanding and agreed to proceed.   History of Present Illness:  05/30/22 ALL ([place]): [name] returns for follow up for OSA on CPAP.     05/25/2021 ALL (Mychart): Mckenzie Tyler is a 52 y.o. female here today for follow up for OSA on CPAP. She continues to do well. She is using CPAP every night for about 7 hours. She does note having a dry mouth at times. She is adjusting humidity which helps. She uses nasal pillow. She may be interested in travel machine.      05/20/19 (Office):  Mckenzie Tyler is a 52 y.o. female here today for follow up of OSA on CPAP. Sleep study in 10/2018 showed "moderate to severe obstructive sleep apnea, with a total AHI of 16.8/hour, REM AHI of 38.1/hour, supine AHI of 23.2/hour and O2 nadir of 77%". Titration study performed and BiPAP attempted, however, patient could not tolerate BiPAP. CPAP was started at set pressure of 15cmH20. She has done well with CPAP therapy at home. She did note increased energy and improved sleep quality after starting therpy. She does continue to have excessive fatigue. She is working on lifestyle changes and managing DM closely with PCP.   Compliance report dated 04/16/2019 through 05/15/2019 reveals that she has used CPAP 30 of the past 30 days for compliance of 100%.  She has used CPAP greater than 4 hours 30 of the past 30 days for  compliance of 100%.  Average usage was 7 hours and 14 minutes.  Residual AHI was 3.4 on set pressure of 15 cm of water and an EPR of 2.  There was no significant leak noted.   Observations/Objective:  Generalized: Well developed, in no acute distress  Mentation: Alert oriented to time, place, history taking. Follows all commands speech and language fluent   Assessment and Plan:  52 y.o. year old female  has a past medical history of Allergy, Depression, Diabetes mellitus type 2 with complications (HCC), Diabetes mellitus without complication (HCC), Hyperlipidemia (02/13/2013), and Smoker. here with  No diagnosis found.   Mckenzie Tyler is doing well on CPAP therapy. Compliance report shows excellent compliance. She was encouraged to continue using CPAP nightly for at least 4 hours. I will send order to DME for supplies. She may consider trying a chin strap to help dry mouth. She will let me know if she needs an order for travel machine pending insurance coverage. Healthy lifestyle habits encouraged. She will follow up in 1 year.   No orders of the defined types were placed in this encounter.   No orders of the defined types were placed in this encounter.    Follow Up Instructions:  I discussed the assessment and treatment plan with the patient. The patient was provided an opportunity to ask questions and all were answered. The patient agreed with the plan and demonstrated an understanding of the instructions.   The patient was advised to call back  or seek an in-person evaluation if the symptoms worsen or if the condition fails to improve as anticipated.  I provided 15 minutes of non-face-to-face time during this encounter. Patient located at their place of residence during [place] visit. Provider is in the office.    Debbora Presto, NP

## 2022-05-31 ENCOUNTER — Telehealth: Payer: BC Managed Care – PPO | Admitting: Family Medicine

## 2022-05-31 DIAGNOSIS — G4733 Obstructive sleep apnea (adult) (pediatric): Secondary | ICD-10-CM

## 2022-06-15 ENCOUNTER — Telehealth: Payer: Self-pay

## 2022-09-02 DIAGNOSIS — H43822 Vitreomacular adhesion, left eye: Secondary | ICD-10-CM | POA: Diagnosis not present

## 2022-09-02 DIAGNOSIS — E113511 Type 2 diabetes mellitus with proliferative diabetic retinopathy with macular edema, right eye: Secondary | ICD-10-CM | POA: Diagnosis not present

## 2022-09-02 DIAGNOSIS — H35371 Puckering of macula, right eye: Secondary | ICD-10-CM | POA: Diagnosis not present

## 2022-09-02 DIAGNOSIS — E113592 Type 2 diabetes mellitus with proliferative diabetic retinopathy without macular edema, left eye: Secondary | ICD-10-CM | POA: Diagnosis not present

## 2022-09-08 ENCOUNTER — Ambulatory Visit: Payer: BC Managed Care – PPO | Admitting: Family Medicine

## 2022-09-13 ENCOUNTER — Encounter (HOSPITAL_BASED_OUTPATIENT_CLINIC_OR_DEPARTMENT_OTHER): Payer: Self-pay | Admitting: Student

## 2022-09-13 ENCOUNTER — Ambulatory Visit (INDEPENDENT_AMBULATORY_CARE_PROVIDER_SITE_OTHER): Payer: BC Managed Care – PPO | Admitting: Student

## 2022-09-13 ENCOUNTER — Other Ambulatory Visit (HOSPITAL_BASED_OUTPATIENT_CLINIC_OR_DEPARTMENT_OTHER): Payer: Self-pay

## 2022-09-13 ENCOUNTER — Ambulatory Visit (INDEPENDENT_AMBULATORY_CARE_PROVIDER_SITE_OTHER): Payer: BC Managed Care – PPO

## 2022-09-13 DIAGNOSIS — M47812 Spondylosis without myelopathy or radiculopathy, cervical region: Secondary | ICD-10-CM | POA: Diagnosis not present

## 2022-09-13 DIAGNOSIS — R200 Anesthesia of skin: Secondary | ICD-10-CM | POA: Diagnosis not present

## 2022-09-13 DIAGNOSIS — R202 Paresthesia of skin: Secondary | ICD-10-CM | POA: Diagnosis not present

## 2022-09-13 DIAGNOSIS — M542 Cervicalgia: Secondary | ICD-10-CM

## 2022-09-13 DIAGNOSIS — M5412 Radiculopathy, cervical region: Secondary | ICD-10-CM

## 2022-09-13 MED ORDER — METHYLPREDNISOLONE 4 MG PO TBPK
ORAL_TABLET | ORAL | 0 refills | Status: DC
  Filled 2022-09-13: qty 21, 6d supply, fill #0
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
## 2022-09-15 ENCOUNTER — Encounter (HOSPITAL_BASED_OUTPATIENT_CLINIC_OR_DEPARTMENT_OTHER): Payer: Self-pay

## 2022-09-16 ENCOUNTER — Ambulatory Visit (HOSPITAL_BASED_OUTPATIENT_CLINIC_OR_DEPARTMENT_OTHER): Payer: BC Managed Care – PPO | Admitting: Orthopaedic Surgery

## 2022-09-20 ENCOUNTER — Other Ambulatory Visit (HOSPITAL_BASED_OUTPATIENT_CLINIC_OR_DEPARTMENT_OTHER): Payer: Self-pay | Admitting: Student

## 2022-09-20 MED ORDER — TRAMADOL HCL 50 MG PO TABS: 50.0000 mg | ORAL_TABLET | Freq: Two times a day (BID) | ORAL | 0 refills | Status: AC | PRN

## 2022-09-20 MED ORDER — MELOXICAM 15 MG PO TABS: 15.0000 mg | ORAL_TABLET | Freq: Every day | ORAL | 0 refills | Status: AC

## 2022-09-22 ENCOUNTER — Ambulatory Visit: Payer: BC Managed Care – PPO | Admitting: Family Medicine

## 2022-09-23 ENCOUNTER — Other Ambulatory Visit (HOSPITAL_BASED_OUTPATIENT_CLINIC_OR_DEPARTMENT_OTHER): Payer: Self-pay | Admitting: Student

## 2022-09-23 ENCOUNTER — Encounter (HOSPITAL_BASED_OUTPATIENT_CLINIC_OR_DEPARTMENT_OTHER): Payer: Self-pay | Admitting: Student

## 2022-09-23 MED ORDER — METHOCARBAMOL 500 MG PO TABS: 500.0000 mg | ORAL_TABLET | Freq: Four times a day (QID) | ORAL | 0 refills | Status: AC | PRN

## 2022-09-24 NOTE — Telephone Encounter (Signed)
Spoke to pt and will send in Robaxin prescription as this has given her some prior relief.  Otherwise if severe pain continues, I recommended sooner evaluation and treatment in the emergency department.

## 2022-09-28 ENCOUNTER — Encounter: Payer: Self-pay | Admitting: Family Medicine

## 2022-09-29 ENCOUNTER — Other Ambulatory Visit (HOSPITAL_BASED_OUTPATIENT_CLINIC_OR_DEPARTMENT_OTHER): Payer: Self-pay | Admitting: Student

## 2022-09-29 ENCOUNTER — Ambulatory Visit
Admission: RE | Admit: 2022-09-29 | Discharge: 2022-09-29 | Disposition: A | Discharge status: Home / Self Care | Payer: BC Managed Care – PPO | Source: Ambulatory Visit | Attending: Student | Admitting: Student

## 2022-09-29 DIAGNOSIS — M5412 Radiculopathy, cervical region: Secondary | ICD-10-CM

## 2022-09-29 DIAGNOSIS — M50221 Other cervical disc displacement at C4-C5 level: Secondary | ICD-10-CM | POA: Diagnosis not present

## 2022-09-29 DIAGNOSIS — M50223 Other cervical disc displacement at C6-C7 level: Secondary | ICD-10-CM | POA: Diagnosis not present

## 2022-09-29 DIAGNOSIS — M47812 Spondylosis without myelopathy or radiculopathy, cervical region: Secondary | ICD-10-CM | POA: Diagnosis not present

## 2022-09-29 DIAGNOSIS — M50222 Other cervical disc displacement at C5-C6 level: Secondary | ICD-10-CM | POA: Diagnosis not present

## 2022-09-29 MED ORDER — TRAMADOL HCL 50 MG PO TABS: 50.0000 mg | ORAL_TABLET | Freq: Four times a day (QID) | ORAL | 0 refills | Status: AC | PRN

## 2022-10-09 ENCOUNTER — Other Ambulatory Visit: Payer: Self-pay | Admitting: Family Medicine

## 2022-10-10 ENCOUNTER — Ambulatory Visit (INDEPENDENT_AMBULATORY_CARE_PROVIDER_SITE_OTHER): Payer: BC Managed Care – PPO | Admitting: Orthopedic Surgery

## 2022-10-10 ENCOUNTER — Encounter: Payer: Self-pay | Admitting: Orthopedic Surgery

## 2022-10-10 VITALS — BP 146/81 | HR 103

## 2022-10-10 DIAGNOSIS — M5412 Radiculopathy, cervical region: Secondary | ICD-10-CM

## 2022-10-10 MED ORDER — MELOXICAM 15 MG PO TABS: 15.0000 mg | ORAL_TABLET | Freq: Every day | ORAL | 0 refills | Status: DC

## 2022-10-10 MED ORDER — TRAMADOL HCL 50 MG PO TABS: 50.0000 mg | ORAL_TABLET | Freq: Four times a day (QID) | ORAL | 0 refills | Status: AC | PRN

## 2022-10-10 MED ORDER — METHOCARBAMOL 500 MG PO TABS: 500.0000 mg | ORAL_TABLET | Freq: Four times a day (QID) | ORAL | 0 refills | Status: AC
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
## 2022-10-11 NOTE — Telephone Encounter (Signed)
Requested Prescriptions  Pending Prescriptions Disp Refills   metFORMIN (GLUCOPHAGE) 1000 MG tablet [Pharmacy Med Name: metFORMIN HCl 1000 MG Oral Tablet] 180 tablet 0    Sig: TAKE 1 TABLET BY MOUTH TWICE DAILY WITH A MEAL     Endocrinology:  Diabetes - Biguanides Failed - 10/09/2022  9:45 AM      Failed - HBA1C is between 0 and 7.9 and within 180 days    Hgb A1C (fingerstick)  Date Value Ref Range Status  04/15/2015 7.2 (H) <5.7 % Final    Comment:                                                                           According to the ADA Clinical Practice Recommendations for 2011, when HbA1c is used as a screening test:     >=6.5%   Diagnostic of Diabetes Mellitus            (if abnormal result is confirmed)   5.7-6.4%   Increased risk of developing Diabetes Mellitus   References:Diagnosis and Classification of Diabetes Mellitus,Diabetes Care,2011,34(Suppl 1):S62-S69 and Standards of Medical Care in         Diabetes - 2011,Diabetes Care,2011,34 (Suppl 1):S11-S61.      Hgb A1c MFr Bld  Date Value Ref Range Status  03/10/2022 6.4 (H) <5.7 % of total Hgb Final    Comment:    For someone without known diabetes, a hemoglobin  A1c value between 5.7% and 6.4% is consistent with prediabetes and should be confirmed with a  follow-up test. . For someone with known diabetes, a value <7% indicates that their diabetes is well controlled. A1c targets should be individualized based on duration of diabetes, age, comorbid conditions, and other considerations. . This assay result is consistent with an increased risk of diabetes. . Currently, no consensus exists regarding use of hemoglobin A1c for diagnosis of diabetes for children. .          Failed - B12 Level in normal range and within 720 days    Vitamin B-12  Date Value Ref Range Status  07/16/2019 594 200 - 1,100 pg/mL Final         Failed - Valid encounter within last 6 months    Recent Outpatient Visits            1 year ago Sciatica of left side   [facility] Donita Brooks, MD   1 year ago Type 2 diabetes mellitus with complication [facility])   [facility] Pickard, Priscille Heidelberg, MD   1 year ago Pain in both hands   [facility] Valentino Nose, NP   2 years ago Type 2 diabetes mellitus with complication [facility])   [facility] Donita Brooks, MD   2 years ago Type 2 diabetes mellitus with complication [facility])   [facility] Pickard, Priscille Heidelberg, MD              Passed - Cr in normal range and within 360 days    Creat  Date Value Ref Range Status  03/10/2022 0.72 0.50 - 1.03 mg/dL Final   Creatinine, Urine  Date Value Ref Range Status  03/10/2022 84 20 - 275 mg/dL Final         Passed - eGFR in normal range and within 360 days    GFR, Est African American  Date Value Ref Range Status  05/22/2020 102 > OR = 60 mL/min/1.28m2 Final   GFR, Est Non African American  Date Value Ref Range Status  05/22/2020 88 > OR = 60 mL/min/1.78m2 Final   eGFR  Date Value Ref Range Status  03/10/2022 100 > OR = 60 mL/min/1.41m2 Final         Passed - CBC within normal limits and completed in the last 12 months    WBC  Date Value Ref Range Status  03/10/2022 3.8 3.8 - 10.8 Thousand/uL Final   RBC  Date Value Ref Range Status  03/10/2022 4.37 3.80 - 5.10 Million/uL Final   Hemoglobin  Date Value Ref Range Status  03/10/2022 13.9 11.7 - 15.5 g/dL Final   HCT  Date Value Ref Range Status  03/10/2022 40.2 35.0 - 45.0 % Final   MCHC  Date Value Ref Range Status  03/10/2022 34.6 32.0 - 36.0 g/dL Final   [facility]  Date Value Ref Range Status  03/10/2022 31.8 27.0 - 33.0 pg Final   MCV  Date Value Ref Range Status  03/10/2022 92.0 80.0 - 100.0 fL Final   No results found for: "PLTCOUNTKUC", "LABPLAT", "POCPLA" RDW  Date Value Ref Range Status  03/10/2022 12.8 11.0 - 15.0 % Final

## 2022-10-25 ENCOUNTER — Ambulatory Visit (INDEPENDENT_AMBULATORY_CARE_PROVIDER_SITE_OTHER): Payer: BC Managed Care – PPO | Admitting: Physical Medicine and Rehabilitation

## 2022-10-25 ENCOUNTER — Other Ambulatory Visit: Payer: Self-pay

## 2022-10-25 VITALS — BP 150/64 | HR 85

## 2022-10-25 DIAGNOSIS — M5412 Radiculopathy, cervical region: Secondary | ICD-10-CM

## 2022-10-25 MED ORDER — METHYLPREDNISOLONE ACETATE 80 MG/ML IJ SUSP
80.0000 mg | Freq: Once | INTRAMUSCULAR | Status: AC
  Administered 2022-10-25: 80 mg

## 2022-10-25 NOTE — Patient Instructions (Signed)

## 2022-10-25 NOTE — Progress Notes (Unsigned)
Functional Pain Scale - descriptive words and definitions  Distressing (6)    Pain is present/unable to complete most ADLs limited by pain/sleep is difficult and active distraction is only marginal. Moderate range order  Average Pain 3-4 with meds, 7-8 with meds   +Driver, -BT, -Dye Allergies.  Neck pain on left side that radiates down the left arm to the fingers

## 2022-10-26 NOTE — Progress Notes (Signed)
Mckenzie Tyler - 52 y.o. female MRN [id number]  Date of birth: [birth year]  Office Visit Note: Visit Date: 10/25/2022 PCP: Donita Brooks, MD Referred by: London Sheer, MD  Subjective: Chief Complaint  Patient presents with   Neck - Pain   HPI:  Mckenzie Tyler is a 52 y.o. female who comes in today at the request of Dr. Willia Craze for planned Left C7-T1 Cervical Interlaminar epidural steroid injection with fluoroscopic guidance.  The patient has failed conservative care including home exercise, medications, time and activity modification.  This injection will be diagnostic and hopefully therapeutic.  Please see requesting physician notes for further details and justification.   ROS Otherwise per HPI.  Assessment & Plan: Visit Diagnoses:    ICD-10-CM   1. Cervical radiculopathy  M54.12 XR C-ARM NO REPORT    Epidural Steroid injection    methylPREDNISolone acetate (DEPO-MEDROL) injection 80 mg      Plan: No additional findings.   Meds & Orders:  Meds ordered this encounter  Medications   methylPREDNISolone acetate (DEPO-MEDROL) injection 80 mg    Orders Placed This Encounter  Procedures   XR C-ARM NO REPORT   Epidural Steroid injection    Follow-up: Return for visit to requesting provider as needed.   Procedures: No procedures performed  Cervical Epidural Steroid Injection - Interlaminar Approach with Fluoroscopic Guidance  Patient: Mckenzie Tyler      Date of Birth: 12-Mar-1970 MRN: [id number] PCP: Donita Brooks, MD      Visit Date: 10/25/2022   Universal Protocol:    Date/Time: 10/25/2409:08 AM  Consent Given By: the patient  Position: PRONE  Additional Comments: Vital signs were monitored before and after the procedure. Patient was prepped and draped in the usual sterile fashion. The correct patient, procedure, and site was verified.   Injection Procedure Details:   Procedure diagnoses: Cervical radiculopathy [M54.12]    Meds  Administered:  Meds ordered this encounter  Medications   methylPREDNISolone acetate (DEPO-MEDROL) injection 80 mg     Laterality: Left  Location/Site: C7-T1  Needle: 3.5 in., 20 ga. Tuohy  Needle Placement: Paramedian epidural space  Findings:  -Comments: Excellent flow of contrast into the epidural space.  Procedure Details: Using a paramedian approach from the side mentioned above, the region overlying the inferior lamina was localized under fluoroscopic visualization and the soft tissues overlying this structure were infiltrated with 4 ml. of 1% Lidocaine without Epinephrine. A # 20 gauge, Tuohy needle was inserted into the epidural space using a paramedian approach.  The epidural space was localized using loss of resistance along with contralateral oblique bi-planar fluoroscopic views.  After negative aspirate for air, blood, and CSF, a 2 ml. volume of Isovue-250 was injected into the epidural space and the flow of contrast was observed. Radiographs were obtained for documentation purposes.   The injectate was administered into the level noted above.  Additional Comments:  The patient tolerated the procedure well Dressing: 2 x 2 sterile gauze and Band-Aid    Post-procedure details: Patient was observed during the procedure. Post-procedure instructions were reviewed.  Patient left the clinic in stable condition.    Clinical History: MRI CERVICAL SPINE WITHOUT CONTRAST   TECHNIQUE: Multiplanar, multisequence MR imaging of the cervical spine was performed. No intravenous contrast was administered.   COMPARISON:  None Available.   FINDINGS: Alignment: Physiologic.   Vertebrae: No acute fracture, evidence of discitis, or aggressive bone lesion.   Cord: Normal signal and morphology.  Posterior Fossa, vertebral arteries, paraspinal tissues: Posterior fossa demonstrates no focal abnormality. Vertebral artery flow voids are maintained. Paraspinal soft tissues are  unremarkable.   Disc levels:   Discs: Degenerative disease with disc height loss at C6-7.   C2-3: No significant disc bulge. No neural foraminal stenosis. No central canal stenosis.   C3-4: No disc protrusion. Mild right foraminal stenosis. No left foraminal stenosis. No spinal stenosis.   C4-5: Mild broad-based disc bulge. Right uncovertebral degenerative changes. Moderate-severe right foraminal stenosis. No left foraminal stenosis. No spinal stenosis.   C5-6: Right paracentral disc broad disc protrusion. Mild bilateral foraminal stenosis. No spinal stenosis.   C6-7: Broad-based disc bulge. Bilateral uncovertebral degenerative changes. Severe bilateral foraminal stenosis. Mild spinal stenosis.   C7-T1: No significant disc bulge. No neural foraminal stenosis. No central canal stenosis.   IMPRESSION: 1. Cervical spine spondylosis as described above. 2. At C6-7 there is a broad-based disc bulge. Bilateral uncovertebral degenerative changes. Severe bilateral foraminal stenosis. Mild spinal stenosis. 3. No acute osseous injury of the cervical spine.     Electronically Signed   By: Elige Ko M.D.   On: 09/29/2022 09:20     Objective:  VS:  HT:    WT:   BMI:     BP:(!) 150/64  HR:85bpm  TEMP: ( )  RESP:  Physical Exam Vitals and nursing note reviewed.  Constitutional:      General: She is not in acute distress.    Appearance: Normal appearance. She is not ill-appearing.  HENT:     Head: Normocephalic and atraumatic.     Right Ear: External ear normal.     Left Ear: External ear normal.  Eyes:     Extraocular Movements: Extraocular movements intact.  Cardiovascular:     Rate and Rhythm: Normal rate.     Pulses: Normal pulses.  Pulmonary:     Effort: Pulmonary effort is normal. No respiratory distress.  Abdominal:     General: There is no distension.     Palpations: Abdomen is soft.  Musculoskeletal:        General: Tenderness present.     Cervical back:  Neck supple.     Right lower leg: No edema.     Left lower leg: No edema.     Comments: Patient has good distal strength with no pain over the greater trochanters.  No clonus or focal weakness.  Skin:    Findings: No erythema, lesion or rash.  Neurological:     General: No focal deficit present.     Mental Status: She is alert and oriented to person, place, and time.     Sensory: No sensory deficit.     Motor: No weakness or abnormal muscle tone.     Coordination: Coordination normal.  Psychiatric:        Mood and Affect: Mood normal.        Behavior: Behavior normal.      Imaging: XR C-ARM NO REPORT  Result Date: 10/25/2022 Please see Notes tab for imaging impression.

## 2022-10-26 NOTE — Procedures (Signed)
Cervical Epidural Steroid Injection - Interlaminar Approach with Fluoroscopic Guidance  Patient: Mckenzie Tyler      Date of Birth: [birth year] MRN: [id number] PCP: Donita Brooks, MD      Visit Date: 10/25/2022   Universal Protocol:    Date/Time: 10/25/2409:08 AM  Consent Given By: the patient  Position: PRONE  Additional Comments: Vital signs were monitored before and after the procedure. Patient was prepped and draped in the usual sterile fashion. The correct patient, procedure, and site was verified.   Injection Procedure Details:   Procedure diagnoses: Cervical radiculopathy [M54.12]    Meds Administered:  Meds ordered this encounter  Medications   methylPREDNISolone acetate (DEPO-MEDROL) injection 80 mg     Laterality: Left  Location/Site: C7-T1  Needle: 3.5 in., 20 ga. Tuohy  Needle Placement: Paramedian epidural space  Findings:  -Comments: Excellent flow of contrast into the epidural space.  Procedure Details: Using a paramedian approach from the side mentioned above, the region overlying the inferior lamina was localized under fluoroscopic visualization and the soft tissues overlying this structure were infiltrated with 4 ml. of 1% Lidocaine without Epinephrine. A # 20 gauge, Tuohy needle was inserted into the epidural space using a paramedian approach.  The epidural space was localized using loss of resistance along with contralateral oblique bi-planar fluoroscopic views.  After negative aspirate for air, blood, and CSF, a 2 ml. volume of Isovue-250 was injected into the epidural space and the flow of contrast was observed. Radiographs were obtained for documentation purposes.   The injectate was administered into the level noted above.  Additional Comments:  The patient tolerated the procedure well Dressing: 2 x 2 sterile gauze and Band-Aid    Post-procedure details: Patient was observed during the procedure. Post-procedure instructions were  reviewed.  Patient left the clinic in stable condition.

## 2022-11-05 ENCOUNTER — Encounter (HOSPITAL_BASED_OUTPATIENT_CLINIC_OR_DEPARTMENT_OTHER): Payer: Self-pay | Admitting: Physical Therapy

## 2022-11-05 ENCOUNTER — Other Ambulatory Visit: Payer: Self-pay

## 2022-11-05 ENCOUNTER — Ambulatory Visit (HOSPITAL_BASED_OUTPATIENT_CLINIC_OR_DEPARTMENT_OTHER): Payer: BC Managed Care – PPO | Attending: Orthopedic Surgery | Admitting: Physical Therapy

## 2022-11-05 DIAGNOSIS — M5412 Radiculopathy, cervical region: Secondary | ICD-10-CM | POA: Diagnosis not present

## 2022-11-05 DIAGNOSIS — M542 Cervicalgia: Secondary | ICD-10-CM | POA: Insufficient documentation
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
## 2022-11-09 ENCOUNTER — Ambulatory Visit (HOSPITAL_BASED_OUTPATIENT_CLINIC_OR_DEPARTMENT_OTHER): Payer: BC Managed Care – PPO | Admitting: Physical Therapy

## 2022-11-09 ENCOUNTER — Encounter (HOSPITAL_BASED_OUTPATIENT_CLINIC_OR_DEPARTMENT_OTHER): Payer: Self-pay | Admitting: Physical Therapy

## 2022-11-09 DIAGNOSIS — M542 Cervicalgia: Secondary | ICD-10-CM | POA: Diagnosis not present

## 2022-11-09 DIAGNOSIS — M5412 Radiculopathy, cervical region: Secondary | ICD-10-CM | POA: Diagnosis not present
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
## 2022-11-12 ENCOUNTER — Ambulatory Visit (HOSPITAL_BASED_OUTPATIENT_CLINIC_OR_DEPARTMENT_OTHER): Payer: BC Managed Care – PPO | Admitting: Physical Therapy

## 2022-11-30 ENCOUNTER — Ambulatory Visit (HOSPITAL_BASED_OUTPATIENT_CLINIC_OR_DEPARTMENT_OTHER): Payer: BC Managed Care – PPO | Admitting: Physical Therapy

## 2022-11-30 ENCOUNTER — Encounter (HOSPITAL_BASED_OUTPATIENT_CLINIC_OR_DEPARTMENT_OTHER): Payer: Self-pay

## 2022-12-03 ENCOUNTER — Encounter (HOSPITAL_BASED_OUTPATIENT_CLINIC_OR_DEPARTMENT_OTHER): Payer: BC Managed Care – PPO | Admitting: Physical Therapy

## 2022-12-10 ENCOUNTER — Encounter (HOSPITAL_BASED_OUTPATIENT_CLINIC_OR_DEPARTMENT_OTHER): Payer: BC Managed Care – PPO | Admitting: Physical Therapy

## 2023-01-07 ENCOUNTER — Other Ambulatory Visit: Payer: Self-pay | Admitting: Family Medicine

## 2023-01-10 ENCOUNTER — Other Ambulatory Visit: Payer: Self-pay

## 2023-01-10 ENCOUNTER — Encounter: Payer: Self-pay | Admitting: Family Medicine

## 2023-01-10 DIAGNOSIS — E118 Type 2 diabetes mellitus with unspecified complications: Secondary | ICD-10-CM

## 2023-01-10 MED ORDER — HUMALOG KWIKPEN 100 UNIT/ML ~~LOC~~ SOPN: 10.0000 [IU] | PEN_INJECTOR | Freq: Three times a day (TID) | SUBCUTANEOUS | 1 refills | Status: DC

## 2023-01-11 ENCOUNTER — Other Ambulatory Visit: Payer: Self-pay

## 2023-01-11 DIAGNOSIS — I2510 Atherosclerotic heart disease of native coronary artery without angina pectoris: Secondary | ICD-10-CM

## 2023-01-11 DIAGNOSIS — Z79899 Other long term (current) drug therapy: Secondary | ICD-10-CM

## 2023-01-11 DIAGNOSIS — E785 Hyperlipidemia, unspecified: Secondary | ICD-10-CM

## 2023-01-11 MED ORDER — ICOSAPENT ETHYL 1 G PO CAPS: 2.0000 g | ORAL_CAPSULE | Freq: Two times a day (BID) | ORAL | 0 refills | Status: DC

## 2023-01-11 MED ORDER — ROSUVASTATIN CALCIUM 20 MG PO TABS: 20.0000 mg | ORAL_TABLET | Freq: Every day | ORAL | 0 refills | Status: DC

## 2023-01-13 ENCOUNTER — Encounter: Payer: Self-pay | Admitting: Family Medicine

## 2023-01-13 ENCOUNTER — Ambulatory Visit (INDEPENDENT_AMBULATORY_CARE_PROVIDER_SITE_OTHER): Payer: BC Managed Care – PPO | Admitting: Family Medicine

## 2023-01-13 VITALS — BP 124/70 | HR 67 | Temp 98.0°F | Ht 67.0 in | Wt 184.5 lb

## 2023-01-13 DIAGNOSIS — Z7984 Long term (current) use of oral hypoglycemic drugs: Secondary | ICD-10-CM

## 2023-01-13 DIAGNOSIS — Z1231 Encounter for screening mammogram for malignant neoplasm of breast: Secondary | ICD-10-CM

## 2023-01-13 DIAGNOSIS — E118 Type 2 diabetes mellitus with unspecified complications: Secondary | ICD-10-CM

## 2023-01-13 DIAGNOSIS — I10 Essential (primary) hypertension: Secondary | ICD-10-CM | POA: Diagnosis not present

## 2023-01-13 NOTE — Progress Notes (Signed)
Subjective:    Patient ID: Mckenzie Tyler, female    DOB: 31-May-1970, 52 y.o.   MRN: [id number]  Patient is a very pleasant 52 year old Caucasian female with a history of type 2 diabetes.  Her diabetic foot exam was performed today and is normal.  Her blood pressure is outstanding.  She states the majority of her blood sugars are under 200.  She seldom uses rapid acting insulin.  She does this less than once a week if she is going out to eat "poorly".  She denies any hypoglycemic episodes.  She has had her Pap smear last year and this was normal.  She is due for mammogram.  Colonoscopy is up-to-date. Past Medical History:  Diagnosis Date   Allergy    Depression    Diabetes mellitus type 2 with complications (HCC)    Diabetes mellitus without complication (HCC)    Phreesia 01/12/2020   Hyperlipidemia 02/13/2013   Smoker    Past Surgical History:  Procedure Laterality Date   EYE SURGERY N/A    [employer]reesia 01/12/2020   RETINOPATHY SURGERY  11/16/2017   due to diabetes    TONSILLECTOMY AND ADENOIDECTOMY     age 52   Current Outpatient Medications on File Prior to Visit  Medication Sig Dispense Refill   aspirin EC 81 MG tablet Take 1 tablet (81 mg total) by mouth daily. Swallow whole. 90 tablet 3   calcium carbonate (OS-CAL) 600 MG tablet Take 600 mg by mouth daily.     glucose blood (RELION GLUCOSE TEST STRIPS) test strip USE AS DIRECTED 100 each 12   HUMALOG KWIKPEN 100 UNIT/ML KwikPen Inject 10-12 Units into the skin 3 (three) times daily. 15 mL 1   icosapent Ethyl (VASCEPA) 1 g capsule Take 2 capsules (2 g total) by mouth 2 (two) times daily. Please call [phone] to schedule an appointment for future refills. Thank you. 120 capsule 0   Magnesium 400 MG TABS Take by mouth.     meloxicam (MOBIC) 15 MG tablet Take 1 tablet (15 mg total) by mouth daily. 30 tablet 0   metFORMIN (GLUCOPHAGE) 1000 MG tablet TAKE 1 TABLET BY MOUTH TWICE DAILY WITH A MEAL 180 tablet 0   Multiple Vitamin  (MULTIVITAMIN) tablet Take 1 tablet by mouth daily.     rosuvastatin (CRESTOR) 20 MG tablet Take 1 tablet (20 mg total) by mouth daily. Please call [phone] to schedule an appointment for future refills. Thank you. 30 tablet 0   vitamin C (ASCORBIC ACID) 500 MG tablet Take 500 mg by mouth daily.     No current facility-administered medications on file prior to visit.     Allergies  Allergen Reactions   Codeine Itching   Social History   Socioeconomic History   Marital status: Single    Spouse name: Not on file   Number of children: Not on file   Years of education: Not on file   Highest education level: Some college, no degree  Occupational History   Not on file  Tobacco Use   Smoking status: Former    Current packs/day: 0.00    Types: Cigarettes    Quit date: 01/21/2018    Years since quitting: 4.9   Smokeless tobacco: Never   Tobacco comments:    has been trying to quit  Substance and Sexual Activity   Alcohol use: No    Alcohol/week: 0.0 standard drinks of alcohol   Drug use: No   Sexual activity: Yes  Birth control/protection: None  Other Topics Concern   Not on file  Social History Narrative   Not on file   Social Drivers of Health   Financial Resource Strain: Medium Risk (01/11/2023)   Overall Financial Resource Strain (CARDIA)    Difficulty of Paying Living Expenses: Somewhat hard  Food Insecurity: No Food Insecurity (01/11/2023)   Hunger Vital Sign    Worried About Running Out of Food in the Last Year: Never true    Ran Out of Food in the Last Year: Never true  Transportation Needs: No Transportation Needs (01/11/2023)   PRAPARE - Administrator, Civil Service (Medical): No    Lack of Transportation (Non-Medical): No  Physical Activity: Insufficiently Active (01/11/2023)   Exercise Vital Sign    Days of Exercise per Week: 2 days    Minutes of Exercise per Session: 20 min  Stress: No Stress Concern Present (01/11/2023)   [employer] of [employer]ational [employer] - Occupational Stress Questionnaire    Feeling of Stress : Only a little  Social Connections: Socially Isolated (01/11/2023)   Social Connection and Isolation Panel [NHANES]    Frequency of Communication with Friends and Family: Three times a week    Frequency of Social Gatherings with Friends and Family: Twice a week    Attends Religious Services: Never    Database administrator or Organizations: No    Attends Engineer, structural: Not on file    Marital Status: Never married  Intimate Partner Violence: Not on file      Review of Systems  All other systems reviewed and are negative.      Objective:   Physical Exam Vitals reviewed.  Constitutional:      General: She is not in acute distress.    Appearance: She is not ill-appearing.  Cardiovascular:     Rate and Rhythm: Normal rate and regular rhythm.     Pulses: Normal pulses.     Heart sounds: Normal heart sounds. No murmur heard.    No gallop.  Pulmonary:     Effort: Pulmonary effort is normal. No respiratory distress.     Breath sounds: Normal breath sounds. No stridor. No wheezing, rhonchi or rales.  Chest:     Chest wall: No tenderness.  Abdominal:     General: Bowel sounds are normal. There is no distension.     Tenderness: There is no guarding or rebound.     Hernia: No hernia is present.  Musculoskeletal:     Right lower leg: No edema.     Left lower leg: No edema.  Neurological:     Mental Status: She is alert.        Assessment & Plan:  Type 2 diabetes mellitus with complication (HCC) - Plan: Hemoglobin A1c, CBC with Differential/Platelet, COMPLETE METABOLIC PANEL WITH GFR, Lipid panel, Microalbumin/Creatinine Ratio, Urine  Benign essential HTN  Encounter for screening mammogram for malignant neoplasm of breast - Plan: MM Digital Screening I am very happy with her blood pressure.  We will check an A1c.  If greater than 7, I would recommend replacing insulin  with Ozempic or with Jardiance.  She previously had a poor reaction to Trulicity.  Check urine protein creatinine ratio.  Check lipid panel.  Goal LDL cholesterol is less than 100.  Schedule the patient for mammogram.

## 2023-01-14 LAB — COMPLETE METABOLIC PANEL WITH GFR
AG Ratio: 2.2 (calc) (ref 1.0–2.5)
ALT: 15 U/L (ref 6–29)
AST: 15 U/L (ref 10–35)
Albumin: 4.6 g/dL (ref 3.6–5.1)
Alkaline phosphatase (APISO): 39 U/L (ref 37–153)
BUN: 12 mg/dL (ref 7–25)
CO2: 30 mmol/L (ref 20–32)
Calcium: 9.5 mg/dL (ref 8.6–10.4)
Chloride: 103 mmol/L (ref 98–110)
Creat: 0.74 mg/dL (ref 0.50–1.03)
Globulin: 2.1 g/dL (ref 1.9–3.7)
Glucose, Bld: 168 mg/dL — ABNORMAL HIGH (ref 65–99)
Potassium: 4.1 mmol/L (ref 3.5–5.3)
Sodium: 140 mmol/L (ref 135–146)
Total Bilirubin: 0.7 mg/dL (ref 0.2–1.2)
Total Protein: 6.7 g/dL (ref 6.1–8.1)
eGFR: 96 mL/min/{1.73_m2} (ref >=60)

## 2023-01-14 LAB — CBC WITH DIFFERENTIAL/PLATELET
Absolute Lymphocytes: 1242 {cells}/uL (ref 850–3900)
Absolute Monocytes: 460 {cells}/uL (ref 200–950)
Basophils Absolute: 41 {cells}/uL (ref 0–200)
Basophils Relative: 0.9 %
Eosinophils Absolute: 262 {cells}/uL (ref 15–500)
Eosinophils Relative: 5.7 %
HCT: 42 % (ref 35.0–45.0)
Hemoglobin: 13.8 g/dL (ref 11.7–15.5)
MCH: 30.2 pg (ref 27.0–33.0)
MCHC: 32.9 g/dL (ref 32.0–36.0)
MCV: 91.9 fL (ref 80.0–100.0)
MPV: 10.4 fL (ref 7.5–12.5)
Monocytes Relative: 10 %
Neutro Abs: 2594 {cells}/uL (ref 1500–7800)
Neutrophils Relative %: 56.4 %
Platelets: 228 10*3/uL (ref 140–400)
RBC: 4.57 10*6/uL (ref 3.80–5.10)
RDW: 12.4 % (ref 11.0–15.0)
Total Lymphocyte: 27 %
WBC: 4.6 10*3/uL (ref 3.8–10.8)

## 2023-01-14 LAB — LIPID PANEL
Cholesterol: 131 mg/dL (ref <200)
HDL: 42 mg/dL — ABNORMAL LOW (ref >=50)
LDL Cholesterol (Calc): 69 mg/dL
Non-HDL Cholesterol (Calc): 89 mg/dL (ref <130)
Total CHOL/HDL Ratio: 3.1 (calc) (ref <5.0)
Triglycerides: 122 mg/dL (ref <150)

## 2023-01-14 LAB — HEMOGLOBIN A1C
Hgb A1c MFr Bld: 7.2 %{Hb} — ABNORMAL HIGH (ref <5.7)
Mean Plasma Glucose: 160 mg/dL
eAG (mmol/L): 8.9 mmol/L

## 2023-01-14 LAB — MICROALBUMIN / CREATININE URINE RATIO
Creatinine, Urine: 67 mg/dL (ref 20–275)
Microalb Creat Ratio: 21 mg/g{creat} (ref <30)
Microalb, Ur: 1.4 mg/dL

## 2023-01-17 ENCOUNTER — Other Ambulatory Visit: Payer: Self-pay

## 2023-01-17 DIAGNOSIS — E118 Type 2 diabetes mellitus with unspecified complications: Secondary | ICD-10-CM

## 2023-01-17 MED ORDER — EMPAGLIFLOZIN 25 MG PO TABS: 25.0000 mg | ORAL_TABLET | Freq: Every day | ORAL | 1 refills | Status: DC

## 2023-03-14 ENCOUNTER — Encounter: Payer: Self-pay | Admitting: Family Medicine

## 2023-03-16 ENCOUNTER — Other Ambulatory Visit: Payer: Self-pay | Admitting: Family Medicine

## 2023-03-16 ENCOUNTER — Telehealth: Payer: Self-pay

## 2023-03-16 DIAGNOSIS — I2510 Atherosclerotic heart disease of native coronary artery without angina pectoris: Secondary | ICD-10-CM

## 2023-03-16 DIAGNOSIS — E785 Hyperlipidemia, unspecified: Secondary | ICD-10-CM

## 2023-03-16 DIAGNOSIS — Z79899 Other long term (current) drug therapy: Secondary | ICD-10-CM

## 2023-03-16 MED ORDER — ICOSAPENT ETHYL 1 G PO CAPS: 2.0000 g | ORAL_CAPSULE | Freq: Two times a day (BID) | ORAL | 3 refills | Status: AC

## 2023-03-16 MED ORDER — ROSUVASTATIN CALCIUM 20 MG PO TABS: 20.0000 mg | ORAL_TABLET | Freq: Every day | ORAL | 3 refills | Status: AC

## 2023-03-16 NOTE — Telephone Encounter (Signed)
Copied from CRM [id number]. Topic: General - Other >> Mar 16, 2023  1:25 PM Carlatta H wrote: Reason for CRM: Please advised Germaine Pomfret of fax number [phone] for Eye doctor

## 2023-04-23 ENCOUNTER — Other Ambulatory Visit: Payer: Self-pay | Admitting: Family Medicine

## 2023-04-25 NOTE — Telephone Encounter (Signed)
 Requested Prescriptions  Pending Prescriptions Disp Refills   metFORMIN (GLUCOPHAGE) 1000 MG tablet [Pharmacy Med Name: metFORMIN HCl 1000 MG Oral Tablet] 180 tablet 0    Sig: TAKE 1 TABLET BY MOUTH TWICE DAILY WITH A MEAL     Endocrinology:  Diabetes - Biguanides Failed - 04/25/2023  9:44 AM      Failed - B12 Level in normal range and within 720 days    Vitamin B-12  Date Value Ref Range Status  07/16/2019 594 200 - 1,100 pg/mL Final         Failed - Valid encounter within last 6 months    Recent Outpatient Visits           1 year ago Sciatica of left side   [facility] Donita Brooks, MD   2 years ago Type 2 diabetes mellitus with complication (HCC)   [facility] Donita Brooks, MD   2 years ago Pain in both hands   [facility] Valentino Nose, NP   2 years ago Type 2 diabetes mellitus with complication (HCC)   [facility] Donita Brooks, MD   3 years ago Type 2 diabetes mellitus with complication (HCC)   [facility] Pickard, Priscille Heidelberg, MD              Passed - Cr in normal range and within 360 days    Creat  Date Value Ref Range Status  01/13/2023 0.74 0.50 - 1.03 mg/dL Final   Creatinine, Urine  Date Value Ref Range Status  01/13/2023 67 20 - 275 mg/dL Final         Passed - HBA1C is between 0 and 7.9 and within 180 days    Hgb A1C (fingerstick)  Date Value Ref Range Status  04/15/2015 7.2 (H) <5.7 % Final    Comment:                                                                           According to the ADA Clinical Practice Recommendations for 2011, when HbA1c is used as a screening test:     >=6.5%   Diagnostic of Diabetes Mellitus            (if abnormal result is confirmed)   5.7-6.4%   Increased risk of developing Diabetes Mellitus   References:Diagnosis and Classification of Diabetes Mellitus,Diabetes Care,2011,34(Suppl 1):S62-S69 and Standards of  Medical Care in         Diabetes - 2011,Diabetes Care,2011,34 (Suppl 1):S11-S61.      Hgb A1c MFr Bld  Date Value Ref Range Status  01/13/2023 7.2 (H) <5.7 % of total Hgb Final    Comment:    For someone without known diabetes, a hemoglobin A1c value of 6.5% or greater indicates that they may have  diabetes and this should be confirmed with a follow-up  test. . For someone with known diabetes, a value <7% indicates  that their diabetes is well controlled and a value  greater than or equal to 7% indicates suboptimal  control. A1c targets should be individualized based on  duration of diabetes, age, comorbid conditions, and  other considerations. . Currently, no  consensus exists regarding use of hemoglobin A1c for diagnosis of diabetes for children. .          Passed - eGFR in normal range and within 360 days    GFR, Est African American  Date Value Ref Range Status  05/22/2020 102 > OR = 60 mL/min/1.17m2 Final   GFR, Est Non African American  Date Value Ref Range Status  05/22/2020 88 > OR = 60 mL/min/1.6m2 Final   eGFR  Date Value Ref Range Status  01/13/2023 96 > OR = 60 mL/min/1.44m2 Final         Passed - CBC within normal limits and completed in the last 12 months    WBC  Date Value Ref Range Status  01/13/2023 4.6 3.8 - 10.8 Thousand/uL Final   RBC  Date Value Ref Range Status  01/13/2023 4.57 3.80 - 5.10 Million/uL Final   Hemoglobin  Date Value Ref Range Status  01/13/2023 13.8 11.7 - 15.5 g/dL Final   HCT  Date Value Ref Range Status  01/13/2023 42.0 35.0 - 45.0 % Final   MCHC  Date Value Ref Range Status  01/13/2023 32.9 32.0 - 36.0 g/dL Final    Comment:    For adults, a slight decrease in the calculated MCHC value (in the range of 30 to 32 g/dL) is most likely not clinically significant; however, it should be interpreted with caution in correlation with other red cell parameters and the patient's clinical condition.    [facility]  Date Value  Ref Range Status  01/13/2023 30.2 27.0 - 33.0 pg Final   MCV  Date Value Ref Range Status  01/13/2023 91.9 80.0 - 100.0 fL Final   No results found for: "PLTCOUNTKUC", "LABPLAT", "POCPLA" RDW  Date Value Ref Range Status  01/13/2023 12.4 11.0 - 15.0 % Final

## 2023-05-08 ENCOUNTER — Other Ambulatory Visit: Payer: Self-pay | Admitting: Family Medicine

## 2023-05-08 DIAGNOSIS — E118 Type 2 diabetes mellitus with unspecified complications: Secondary | ICD-10-CM

## 2023-05-09 NOTE — Telephone Encounter (Signed)
 Requested Prescriptions  Pending Prescriptions Disp Refills   glucose blood (RELION GLUCOSE TEST STRIPS) test strip [Pharmacy Med Name: ReliOn Blood Glucose Test In Vitro Strip] 100 each 1    Sig: USE AS DIRECTED     Endocrinology: Diabetes - Testing Supplies Failed - 05/09/2023  3:41 PM      Failed - Valid encounter within last 12 months    Recent Outpatient Visits           3 months ago Type 2 diabetes mellitus with complication [facility])   [facility] Pickard, Priscille Heidelberg, MD   1 year ago Benign essential HTN   [facility] Donita Brooks, MD   1 year ago Screening for malignant neoplasm of cervix   [place] [facility] Park Meo, FNP   1 year ago Type 2 diabetes mellitus with complication [facility])   [facility] Pickard, Priscille Heidelberg, MD

## 2023-07-21 ENCOUNTER — Other Ambulatory Visit: Payer: Self-pay | Admitting: Family Medicine

## 2023-07-28 ENCOUNTER — Telehealth: Payer: Self-pay

## 2023-07-28 NOTE — Telephone Encounter (Signed)
 Prescription change request faxed in from [facility]. Form completed and signed by Dr. Duanne. On 07/28/2023. Faxed to [facility] [phone]

## 2023-07-31 ENCOUNTER — Other Ambulatory Visit (HOSPITAL_BASED_OUTPATIENT_CLINIC_OR_DEPARTMENT_OTHER): Payer: Self-pay

## 2023-07-31 ENCOUNTER — Ambulatory Visit (INDEPENDENT_AMBULATORY_CARE_PROVIDER_SITE_OTHER): Admitting: Orthopaedic Surgery

## 2023-07-31 DIAGNOSIS — M654 Radial styloid tenosynovitis [de Quervain]: Secondary | ICD-10-CM

## 2023-07-31 DIAGNOSIS — M79642 Pain in left hand: Secondary | ICD-10-CM | POA: Diagnosis not present

## 2023-07-31 NOTE — Progress Notes (Addendum)
 Chief Complaint: Left thumb pain     History of Present Illness:    Mckenzie Tyler is a 53 y.o. female today with ongoing left thumb pain about the first dorsal compartment which has been ongoing for the last several months particularly after she was wearing a bangle during Easter celebration.  This is her dominant hand.  She works in Public affairs consultant.  She is from [place]  originally    PMH/PSH/Family History/Social History/Meds/Allergies:    Past Medical History:  Diagnosis Date  . Allergy   . Depression   . Diabetes mellitus type 2 with complications (HCC)   . Diabetes mellitus without complication (HCC)    Phreesia 01/12/2020  . Hyperlipidemia 02/13/2013  . Smoker    Past Surgical History:  Procedure Laterality Date  . EYE SURGERY N/A    [employer]ia 01/12/2020  . RETINOPATHY SURGERY  11/16/2017   due to diabetes   . TONSILLECTOMY AND ADENOIDECTOMY     age 53   Social History   Socioeconomic History  . Marital status: Single    Spouse name: Not on file  . Number of children: Not on file  . Years of education: Not on file  . Highest education level: Some college, no degree  Occupational History  . Not on file  Tobacco Use  . Smoking status: Former    Current packs/day: 0.00    Types: Cigarettes    Quit date: 01/21/2018    Years since quitting: 5.5  . Smokeless tobacco: Never  . Tobacco comments:    has been trying to quit  Substance and Sexual Activity  . Alcohol use: No    Alcohol/week: 0.0 standard drinks of alcohol  . Drug use: No  . Sexual activity: Yes    Birth control/protection: None  Other Topics Concern  . Not on file  Social History Narrative  . Not on file   Social Drivers of Health   Financial Resource Strain: Medium Risk (01/11/2023)   Overall Financial Resource Strain (CARDIA)   . Difficulty of Paying Living Expenses: Somewhat hard  Food Insecurity: No Food Insecurity (01/11/2023)   Hunger Vital Sign   . Worried  About Programme researcher, broadcasting/film/video in the Last Year: Never true   . Ran Out of Food in the Last Year: Never true  Transportation Needs: No Transportation Needs (01/11/2023)   PRAPARE - Transportation   . Lack of Transportation (Medical): No   . Lack of Transportation (Non-Medical): No  Physical Activity: Insufficiently Active (01/11/2023)   Exercise Vital Sign   . Days of Exercise per Week: 2 days   . Minutes of Exercise per Session: 20 min  Stress: No Stress Concern Present (01/11/2023)   [employer] of Occupational Health - Occupational Stress Questionnaire   . Feeling of Stress : Only a little  Social Connections: Socially Isolated (01/11/2023)   Social Connection and Isolation Panel   . Frequency of Communication with Friends and Family: Three times a week   . Frequency of Social Gatherings with Friends and Family: Twice a week   . Attends Religious Services: Never   . Active Member of Clubs or Organizations: No   . Attends Banker Meetings: Not on file   . Marital Status: Never married   Family History  Problem Relation Age of Onset  . Diabetes Father   . Cancer Neg Hx   . Heart disease Neg Hx   . Colon cancer Neg Hx   . Colon  polyps Neg Hx   . Rectal cancer Neg Hx   . Stomach cancer Neg Hx    Allergies  Allergen Reactions  . Codeine Itching   Current Outpatient Medications  Medication Sig Dispense Refill  . aspirin  EC 81 MG tablet Take 1 tablet (81 mg total) by mouth daily. Swallow whole. 90 tablet 3  . calcium  carbonate (OS-CAL) 600 MG tablet Take 600 mg by mouth daily.    . empagliflozin  (JARDIANCE ) 25 MG TABS tablet Take 1 tablet (25 mg total) by mouth daily before breakfast. 90 tablet 1  . glucose blood (RELION GLUCOSE TEST STRIPS) test strip USE AS DIRECTED 100 each 1  . HUMALOG  KWIKPEN 100 UNIT/ML KwikPen Inject 10-12 Units into the skin 3 (three) times daily. 15 mL 1  . icosapent  Ethyl (VASCEPA ) 1 g capsule Take 2 capsules (2 g total) by mouth 2 (two)  times daily. 360 capsule 3  . Magnesium 400 MG TABS Take by mouth.    . metFORMIN  (GLUCOPHAGE ) 1000 MG tablet TAKE 1 TABLET BY MOUTH TWICE DAILY WITH A MEAL 180 tablet 0  . Multiple Vitamin (MULTIVITAMIN) tablet Take 1 tablet by mouth daily.    . rosuvastatin  (CRESTOR ) 20 MG tablet Take 1 tablet (20 mg total) by mouth daily. 90 tablet 3  . vitamin C (ASCORBIC ACID) 500 MG tablet Take 500 mg by mouth daily.     No current facility-administered medications for this visit.   No results found.  Review of Systems:   A ROS was performed including pertinent positives and negatives as documented in the HPI.  Physical Exam :   Constitutional: NAD and appears stated age Neurological: Alert and oriented Psych: Appropriate affect and cooperative Last menstrual period 04/25/2013.   Comprehensive Musculoskeletal Exam:    Left wrist with first dorsal compartment pain and positive Finkelstein's maneuver.  Otherwise normal exam with full range of motion and distal neurosensory exam   Imaging:     I personally reviewed and interpreted the radiographs.   Assessment and Plan:   53 y.o. female with left dorsal first compartment tenosynovitis.  Today's visit I did ultimately recommend initial ultrasound-guided injection the first dorsal compartment.  I did also discuss the possibility of a spica thumb splint which she can can use it at work as needed.  I will plan to see her back as needed should she have a recurrence of this tenosynovitis   -Left wrist injection provided after verbal consent obtained    Procedure Note  Patient: Mckenzie Tyler             Date of Birth: 05/10/1970           MRN: [id number]             Visit Date: 07/31/2023  Procedures: Visit Diagnoses:  1. De Quervain's syndrome (tenosynovitis)     Medium Joint Inj: L radiocarpal on 07/31/2023 9:29 AM Details: ultrasound-guided        I personally saw and evaluated the patient, and participated in the management  and treatment plan.  Elspeth Parker, MD Attending Physician, Orthopedic Surgery  This document was dictated using Dragon voice recognition software. A reasonable attempt at proof reading has been made to minimize errors.

## 2023-08-03 ENCOUNTER — Other Ambulatory Visit: Payer: Self-pay | Admitting: Family Medicine

## 2023-08-03 DIAGNOSIS — E118 Type 2 diabetes mellitus with unspecified complications: Secondary | ICD-10-CM

## 2023-08-08 ENCOUNTER — Encounter: Payer: Self-pay | Admitting: Family Medicine

## 2023-08-10 ENCOUNTER — Other Ambulatory Visit: Payer: Self-pay | Admitting: Family Medicine

## 2023-08-10 MED ORDER — STEGLATRO 15 MG PO TABS: 15.0000 mg | ORAL_TABLET | Freq: Every day | ORAL | 3 refills | Status: DC

## 2023-10-10 ENCOUNTER — Other Ambulatory Visit: Payer: Self-pay | Admitting: Family Medicine

## 2023-10-13 ENCOUNTER — Other Ambulatory Visit (HOSPITAL_COMMUNITY): Payer: Self-pay

## 2023-10-20 ENCOUNTER — Encounter: Payer: Self-pay | Admitting: Family Medicine

## 2023-10-20 ENCOUNTER — Ambulatory Visit (INDEPENDENT_AMBULATORY_CARE_PROVIDER_SITE_OTHER): Admitting: Family Medicine

## 2023-10-20 VITALS — BP 118/62 | HR 71 | Temp 98.4°F | Ht 67.0 in | Wt 179.0 lb

## 2023-10-20 DIAGNOSIS — I2510 Atherosclerotic heart disease of native coronary artery without angina pectoris: Secondary | ICD-10-CM | POA: Diagnosis not present

## 2023-10-20 DIAGNOSIS — Z7984 Long term (current) use of oral hypoglycemic drugs: Secondary | ICD-10-CM

## 2023-10-20 DIAGNOSIS — E118 Type 2 diabetes mellitus with unspecified complications: Secondary | ICD-10-CM

## 2023-10-20 DIAGNOSIS — I10 Essential (primary) hypertension: Secondary | ICD-10-CM | POA: Diagnosis not present

## 2023-10-20 DIAGNOSIS — E785 Hyperlipidemia, unspecified: Secondary | ICD-10-CM | POA: Diagnosis not present

## 2023-10-20 NOTE — Progress Notes (Signed)
 Subjective:    Patient ID: Mckenzie Tyler, female    DOB: 03-03-1970, 53 y.o.   MRN: [id number]  Has known coronary artery disease.  On a coronary artery calcium  score, she was found to have significant plaque in her LAD and in her right coronary artery.  She is asymptomatic.  She denies any angina or shortness of breath or dyspnea on exertion.  Her blood pressure today is well-controlled.  She is currently on rosuvastatin  and Vascepa .  She is due to recheck her cholesterol.  Her goal LDL is less than 55.  She is also due to recheck her hemoglobin A1c.  She is currently on metformin  and Jardiance .  Her fasting blood sugars are typically around 140.  2-hour postprandial sugars are well below 200.  She denies any hypoglycemia.  Past Medical History:  Diagnosis Date   Allergy    Depression    Diabetes mellitus type 2 with complications (HCC)    Diabetes mellitus without complication (HCC)    Phreesia 01/12/2020   Hyperlipidemia 02/13/2013   Smoker    Past Surgical History:  Procedure Laterality Date   EYE SURGERY N/A    [employer]reesia 01/12/2020   RETINOPATHY SURGERY  11/16/2017   due to diabetes    TONSILLECTOMY AND ADENOIDECTOMY     age 20   Current Outpatient Medications on File Prior to Visit  Medication Sig Dispense Refill   aspirin  EC 81 MG tablet Take 1 tablet (81 mg total) by mouth daily. Swallow whole. 90 tablet 3   calcium  carbonate (OS-CAL) 600 MG tablet Take 600 mg by mouth daily.     ertugliflozin L-PyroglutamicAc (STEGLATRO ) 15 MG TABS tablet Take 1 tablet (15 mg total) by mouth daily. 90 tablet 3   glucose blood (RELION GLUCOSE TEST STRIPS) test strip USE AS DIRECTED 100 each 1   HUMALOG  KWIKPEN 100 UNIT/ML KwikPen Inject 10-12 Units into the skin 3 (three) times daily. 15 mL 1   icosapent  Ethyl (VASCEPA ) 1 g capsule Take 2 capsules (2 g total) by mouth 2 (two) times daily. 360 capsule 3   Magnesium 400 MG TABS Take by mouth.     metFORMIN  (GLUCOPHAGE ) 1000 MG tablet TAKE 1  TABLET BY MOUTH TWICE DAILY WITH A MEAL 180 tablet 0   Multiple Vitamin (MULTIVITAMIN) tablet Take 1 tablet by mouth daily.     rosuvastatin  (CRESTOR ) 20 MG tablet Take 1 tablet (20 mg total) by mouth daily. 90 tablet 3   vitamin C (ASCORBIC ACID) 500 MG tablet Take 500 mg by mouth daily.     No current facility-administered medications on file prior to visit.     Allergies  Allergen Reactions   Codeine Itching   Social History   Socioeconomic History   Marital status: Single    Spouse name: Not on file   Number of children: Not on file   Years of education: Not on file   Highest education level: Some college, no degree  Occupational History   Not on file  Tobacco Use   Smoking status: Former    Current packs/day: 0.00    Types: Cigarettes    Quit date: 01/21/2018    Years since quitting: 5.7   Smokeless tobacco: Never   Tobacco comments:    has been trying to quit  Substance and Sexual Activity   Alcohol use: No    Alcohol/week: 0.0 standard drinks of alcohol   Drug use: No   Sexual activity: Yes    Birth control/protection:  None  Other Topics Concern   Not on file  Social History Narrative   Not on file   Social Drivers of Health   Financial Resource Strain: Low Risk  (10/18/2023)   Overall Financial Resource Strain (CARDIA)    Difficulty of Paying Living Expenses: Not hard at all  Food Insecurity: No Food Insecurity (10/18/2023)   Hunger Vital Sign    Worried About Running Out of Food in the Last Year: Never true    Ran Out of Food in the Last Year: Never true  Transportation Needs: No Transportation Needs (10/18/2023)   PRAPARE - Administrator, Civil Service (Medical): No    Lack of Transportation (Non-Medical): No  Physical Activity: Inactive (10/18/2023)   Exercise Vital Sign    Days of Exercise per Week: 0 days    Minutes of Exercise per Session: Not on file  Stress: No Stress Concern Present (10/18/2023)   [employer] of Occupational  Health - Occupational Stress Questionnaire    Feeling of Stress: Not at all  Social Connections: Socially Isolated (10/18/2023)   Social Connection and Isolation Panel    Frequency of Communication with Friends and Family: Twice a week    Frequency of Social Gatherings with Friends and Family: Twice a week    Attends Religious Services: Never    Database administrator or Organizations: No    Attends Engineer, structural: Not on file    Marital Status: Never married  Intimate Partner Violence: Not on file      Review of Systems  All other systems reviewed and are negative.      Objective:   Physical Exam Vitals reviewed.  Constitutional:      General: She is not in acute distress.    Appearance: She is not ill-appearing.  Cardiovascular:     Rate and Rhythm: Normal rate and regular rhythm.     Pulses: Normal pulses.     Heart sounds: Normal heart sounds. No murmur heard.    No gallop.  Pulmonary:     Effort: Pulmonary effort is normal. No respiratory distress.     Breath sounds: Normal breath sounds. No stridor. No wheezing, rhonchi or rales.  Chest:     Chest wall: No tenderness.  Abdominal:     General: Bowel sounds are normal. There is no distension.     Tenderness: There is no guarding or rebound.     Hernia: No hernia is present.  Musculoskeletal:     Right lower leg: No edema.     Left lower leg: No edema.  Neurological:     Mental Status: She is alert.        Assessment & Plan:  Type 2 diabetes mellitus with complication (HCC)  Hyperlipidemia, unspecified hyperlipidemia type  Benign essential HTN  Coronary artery disease involving native coronary artery of native heart without angina pectoris   blood pressure today is excellent.  Check A1c.  Goal A1c is less than 6.5.  If greater than 6.5, consider adding Januvia as patient is unable to tolerate GLP-1's.  If LDL cholesterol is much greater than 55 especially over 100, consider switching Vascepa   to Repatha

## 2023-10-21 LAB — MICROALBUMIN, URINE: Microalb, Ur: 2.9 mg/dL

## 2023-10-21 LAB — CBC WITH DIFFERENTIAL/PLATELET
Absolute Lymphocytes: 1029 {cells}/uL (ref 850–3900)
Absolute Monocytes: 545 {cells}/uL (ref 200–950)
Basophils Absolute: 42 {cells}/uL (ref 0–200)
Basophils Relative: 0.9 %
Eosinophils Absolute: 414 {cells}/uL (ref 15–500)
Eosinophils Relative: 8.8 %
HCT: 46.2 % — ABNORMAL HIGH (ref 35.0–45.0)
Hemoglobin: 15.4 g/dL (ref 11.7–15.5)
MCH: 30.3 pg (ref 27.0–33.0)
MCHC: 33.3 g/dL (ref 32.0–36.0)
MCV: 90.8 fL (ref 80.0–100.0)
MPV: 10 fL (ref 7.5–12.5)
Monocytes Relative: 11.6 %
Neutro Abs: 2670 {cells}/uL (ref 1500–7800)
Neutrophils Relative %: 56.8 %
Platelets: 200 Thousand/uL (ref 140–400)
RBC: 5.09 Million/uL (ref 3.80–5.10)
RDW: 13.2 % (ref 11.0–15.0)
Total Lymphocyte: 21.9 %
WBC: 4.7 Thousand/uL (ref 3.8–10.8)

## 2023-10-21 LAB — LIPID PANEL
Cholesterol: 115 mg/dL (ref <200)
HDL: 35 mg/dL — ABNORMAL LOW (ref >=50)
LDL Cholesterol (Calc): 55 mg/dL
Non-HDL Cholesterol (Calc): 80 mg/dL (ref <130)
Total CHOL/HDL Ratio: 3.3 (calc) (ref <5.0)
Triglycerides: 177 mg/dL — ABNORMAL HIGH (ref <150)

## 2023-10-21 LAB — COMPREHENSIVE METABOLIC PANEL WITH GFR
AG Ratio: 2.2 (calc) (ref 1.0–2.5)
ALT: 22 U/L (ref 6–29)
AST: 19 U/L (ref 10–35)
Albumin: 4.8 g/dL (ref 3.6–5.1)
Alkaline phosphatase (APISO): 36 U/L — ABNORMAL LOW (ref 37–153)
BUN: 19 mg/dL (ref 7–25)
CO2: 25 mmol/L (ref 20–32)
Calcium: 9.6 mg/dL (ref 8.6–10.4)
Chloride: 104 mmol/L (ref 98–110)
Creat: 0.69 mg/dL (ref 0.50–1.03)
Globulin: 2.2 g/dL (ref 1.9–3.7)
Glucose, Bld: 170 mg/dL — ABNORMAL HIGH (ref 65–99)
Potassium: 4.3 mmol/L (ref 3.5–5.3)
Sodium: 142 mmol/L (ref 135–146)
Total Bilirubin: 0.5 mg/dL (ref 0.2–1.2)
Total Protein: 7 g/dL (ref 6.1–8.1)
eGFR: 102 mL/min/1.73m2 (ref >=60)

## 2023-10-21 LAB — HEMOGLOBIN A1C
Hgb A1c MFr Bld: 7 % — ABNORMAL HIGH (ref <5.7)
Mean Plasma Glucose: 154 mg/dL
eAG (mmol/L): 8.5 mmol/L

## 2023-10-23 ENCOUNTER — Ambulatory Visit: Payer: Self-pay | Admitting: Family Medicine

## 2023-10-27 ENCOUNTER — Other Ambulatory Visit: Payer: Self-pay | Admitting: Family Medicine

## 2023-10-27 DIAGNOSIS — E118 Type 2 diabetes mellitus with unspecified complications: Secondary | ICD-10-CM

## 2023-10-27 NOTE — Telephone Encounter (Signed)
 Requested Prescriptions  Pending Prescriptions Disp Refills   HUMALOG  KWIKPEN 100 UNIT/ML KwikPen [Pharmacy Med Name: HumaLOG  KwikPen 100 UNIT/ML Subcutaneous Solution Pen-injector] 15 mL 0    Sig: INJECT 10 TO 12 UNITS SUBCUTANEOUSLY THREE TIMES DAILY     Endocrinology:  Diabetes - Insulins Passed - 10/27/2023  5:54 PM      Passed - HBA1C is between 0 and 7.9 and within 180 days    Hgb A1C (fingerstick)  Date Value Ref Range Status  04/15/2015 7.2 (H) <5.7 % Final    Comment:                                                                           According to the ADA Clinical Practice Recommendations for 2011, when HbA1c is used as a screening test:     >=6.5%   Diagnostic of Diabetes Mellitus            (if abnormal result is confirmed)   5.7-6.4%   Increased risk of developing Diabetes Mellitus   References:Diagnosis and Classification of Diabetes Mellitus,Diabetes Care,2011,34(Suppl 1):S62-S69 and Standards of Medical Care in         Diabetes - 2011,Diabetes Care,2011,34 (Suppl 1):S11-S61.      Hgb A1c MFr Bld  Date Value Ref Range Status  10/20/2023 7.0 (H) <5.7 % Final    Comment:    For someone without known diabetes, a hemoglobin A1c value of 6.5% or greater indicates that they may have  diabetes and this should be confirmed with a follow-up  test. . For someone with known diabetes, a value <7% indicates  that their diabetes is well controlled and a value  greater than or equal to 7% indicates suboptimal  control. A1c targets should be individualized based on  duration of diabetes, age, comorbid conditions, and  other considerations. . Currently, no consensus exists regarding use of hemoglobin A1c for diagnosis of diabetes for children. SABRA Amy - Valid encounter within last 6 months    Recent Outpatient Visits           1 week ago Type 2 diabetes mellitus with complication [facility])   [place] [facility] Medicine Duanne Butler DASEN, MD    9 months ago Type 2 diabetes mellitus with complication [facility])   [place] [facility] Family Medicine Pickard, Butler DASEN, MD   1 year ago Benign essential HTN   [place] [facility] Medicine Duanne Butler DASEN, MD   2 years ago Screening for malignant neoplasm of cervix   [facility] Medicine Kayla Jeoffrey RAMAN, FNP   2 years ago Type 2 diabetes mellitus with complication [facility])    [facility] Pickard, Butler DASEN, MD               JARDIANCE  25 MG TABS tablet [Pharmacy Med Name: Jardiance  25 MG Oral Tablet] 90 tablet 0    Sig: TAKE 1 TABLET BY MOUTH ONCE DAILY BEFORE BREAKFAST     Endocrinology:  Diabetes - SGLT2 Inhibitors Passed - 10/27/2023  5:54 PM      Passed - Cr in normal range and within 360 days  Creat  Date Value Ref Range Status  10/20/2023 0.69 0.50 - 1.03 mg/dL Final   Creatinine, Urine  Date Value Ref Range Status  01/13/2023 67 20 - 275 mg/dL Final         Passed - HBA1C is between 0 and 7.9 and within 180 days    Hgb A1C (fingerstick)  Date Value Ref Range Status  04/15/2015 7.2 (H) <5.7 % Final    Comment:                                                                           According to the ADA Clinical Practice Recommendations for 2011, when HbA1c is used as a screening test:     >=6.5%   Diagnostic of Diabetes Mellitus            (if abnormal result is confirmed)   5.7-6.4%   Increased risk of developing Diabetes Mellitus   References:Diagnosis and Classification of Diabetes Mellitus,Diabetes Care,2011,34(Suppl 1):S62-S69 and Standards of Medical Care in         Diabetes - 2011,Diabetes Care,2011,34 (Suppl 1):S11-S61.      Hgb A1c MFr Bld  Date Value Ref Range Status  10/20/2023 7.0 (H) <5.7 % Final    Comment:    For someone without known diabetes, a hemoglobin A1c value of 6.5% or greater indicates that they may have  diabetes and this should be confirmed with a follow-up   test. . For someone with known diabetes, a value <7% indicates  that their diabetes is well controlled and a value  greater than or equal to 7% indicates suboptimal  control. A1c targets should be individualized based on  duration of diabetes, age, comorbid conditions, and  other considerations. . Currently, no consensus exists regarding use of hemoglobin A1c for diagnosis of diabetes for children. .          Passed - eGFR in normal range and within 360 days    GFR, Est African American  Date Value Ref Range Status  05/22/2020 102 > OR = 60 mL/min/1.46m2 Final   GFR, Est Non African American  Date Value Ref Range Status  05/22/2020 88 > OR = 60 mL/min/1.71m2 Final   eGFR  Date Value Ref Range Status  10/20/2023 102 > OR = 60 mL/min/1.46m2 Final         Passed - Valid encounter within last 6 months    Recent Outpatient Visits           1 week ago Type 2 diabetes mellitus with complication [facility])   [place] [facility] Medicine Duanne Butler DASEN, MD   9 months ago Type 2 diabetes mellitus with complication [facility])   [place] [facility] Family Medicine Pickard, Butler DASEN, MD   1 year ago Benign essential HTN   [place] [facility] Family Medicine Duanne Butler DASEN, MD   2 years ago Screening for malignant neoplasm of cervix   [place] [facility] Family Medicine Kayla Jeoffrey RAMAN, FNP   2 years ago Type 2 diabetes mellitus with complication [facility])    [facility] Pickard, Butler DASEN, MD  metFORMIN  (GLUCOPHAGE ) 1000 MG tablet [Pharmacy Med Name: metFORMIN  HCl 1000 MG Oral Tablet] 180 tablet 1    Sig: TAKE 1 TABLET BY MOUTH TWICE DAILY WITH A MEAL     Endocrinology:  Diabetes - Biguanides Failed - 10/27/2023  5:54 PM      Failed - B12 Level in normal range and within 720 days    Vitamin B-12  Date Value Ref Range Status  07/16/2019 594 200 - 1,100 pg/mL Final         Failed - CBC within normal limits and  completed in the last 12 months    WBC  Date Value Ref Range Status  10/20/2023 4.7 3.8 - 10.8 Thousand/uL Final   RBC  Date Value Ref Range Status  10/20/2023 5.09 3.80 - 5.10 Million/uL Final   Hemoglobin  Date Value Ref Range Status  10/20/2023 15.4 11.7 - 15.5 g/dL Final   HCT  Date Value Ref Range Status  10/20/2023 46.2 (H) 35.0 - 45.0 % Final   MCHC  Date Value Ref Range Status  10/20/2023 33.3 32.0 - 36.0 g/dL Final    Comment:    For adults, a slight decrease in the calculated MCHC value (in the range of 30 to 32 g/dL) is most likely not clinically significant; however, it should be interpreted with caution in correlation with other red cell parameters and the patient's clinical condition.    [facility]  Date Value Ref Range Status  10/20/2023 30.3 27.0 - 33.0 pg Final   MCV  Date Value Ref Range Status  10/20/2023 90.8 80.0 - 100.0 fL Final   No results found for: PLTCOUNTKUC, LABPLAT, POCPLA RDW  Date Value Ref Range Status  10/20/2023 13.2 11.0 - 15.0 % Final         Passed - Cr in normal range and within 360 days    Creat  Date Value Ref Range Status  10/20/2023 0.69 0.50 - 1.03 mg/dL Final   Creatinine, Urine  Date Value Ref Range Status  01/13/2023 67 20 - 275 mg/dL Final         Passed - HBA1C is between 0 and 7.9 and within 180 days    Hgb A1C (fingerstick)  Date Value Ref Range Status  04/15/2015 7.2 (H) <5.7 % Final    Comment:                                                                           According to the ADA Clinical Practice Recommendations for 2011, when HbA1c is used as a screening test:     >=6.5%   Diagnostic of Diabetes Mellitus            (if abnormal result is confirmed)   5.7-6.4%   Increased risk of developing Diabetes Mellitus   References:Diagnosis and Classification of Diabetes Mellitus,Diabetes Care,2011,34(Suppl 1):S62-S69 and Standards of Medical Care in         Diabetes - 2011,Diabetes Care,2011,34  (Suppl 1):S11-S61.      Hgb A1c MFr Bld  Date Value Ref Range Status  10/20/2023 7.0 (H) <5.7 % Final    Comment:    For someone without known diabetes, a hemoglobin A1c value of 6.5% or greater indicates that  they may have  diabetes and this should be confirmed with a follow-up  test. . For someone with known diabetes, a value <7% indicates  that their diabetes is well controlled and a value  greater than or equal to 7% indicates suboptimal  control. A1c targets should be individualized based on  duration of diabetes, age, comorbid conditions, and  other considerations. . Currently, no consensus exists regarding use of hemoglobin A1c for diagnosis of diabetes for children. .          Passed - eGFR in normal range and within 360 days    GFR, Est African American  Date Value Ref Range Status  05/22/2020 102 > OR = 60 mL/min/1.67m2 Final   GFR, Est Non African American  Date Value Ref Range Status  05/22/2020 88 > OR = 60 mL/min/1.22m2 Final   eGFR  Date Value Ref Range Status  10/20/2023 102 > OR = 60 mL/min/1.16m2 Final         Passed - Valid encounter within last 6 months    Recent Outpatient Visits           1 week ago Type 2 diabetes mellitus with complication [facility])   [place] [facility] Medicine Duanne Butler DASEN, MD   9 months ago Type 2 diabetes mellitus with complication [facility])   [place] [facility] Pickard, Butler DASEN, MD   1 year ago Benign essential HTN   [place] [facility] Family Medicine Duanne Butler DASEN, MD   2 years ago Screening for malignant neoplasm of cervix   [place] [facility] Family Medicine Kayla Jeoffrey RAMAN, FNP   2 years ago Type 2 diabetes mellitus with complication [facility])    [place] [facility] Family Medicine Pickard, Butler DASEN, MD

## 2023-10-27 NOTE — Telephone Encounter (Signed)
 Requested medications are due for refill today.  Jardiance  is listed as historical. Last order for Humalog  should have only been a 83 day supply.  Requested medications are on the active medications list.  yes  Last refill. Jardiance  - unknown. Humalog  01/10/2023 15mL 1 rf  Future visit scheduled.   no  Notes to clinic.  Please review for refill.    Requested Prescriptions  Pending Prescriptions Disp Refills   HUMALOG  KWIKPEN 100 UNIT/ML KwikPen [Pharmacy Med Name: HumaLOG  KwikPen 100 UNIT/ML Subcutaneous Solution Pen-injector] 15 mL 0    Sig: INJECT 10 TO 12 UNITS SUBCUTANEOUSLY THREE TIMES DAILY     Endocrinology:  Diabetes - Insulins Passed - 10/27/2023  5:54 PM      Passed - HBA1C is between 0 and 7.9 and within 180 days    Hgb A1C (fingerstick)  Date Value Ref Range Status  04/15/2015 7.2 (H) <5.7 % Final    Comment:                                                                           According to the ADA Clinical Practice Recommendations for 2011, when HbA1c is used as a screening test:     >=6.5%   Diagnostic of Diabetes Mellitus            (if abnormal result is confirmed)   5.7-6.4%   Increased risk of developing Diabetes Mellitus   References:Diagnosis and Classification of Diabetes Mellitus,Diabetes Care,2011,34(Suppl 1):S62-S69 and Standards of Medical Care in         Diabetes - 2011,Diabetes Care,2011,34 (Suppl 1):S11-S61.      Hgb A1c MFr Bld  Date Value Ref Range Status  10/20/2023 7.0 (H) <5.7 % Final    Comment:    For someone without known diabetes, a hemoglobin A1c value of 6.5% or greater indicates that they may have  diabetes and this should be confirmed with a follow-up  test. . For someone with known diabetes, a value <7% indicates  that their diabetes is well controlled and a value  greater than or equal to 7% indicates suboptimal  control. A1c targets should be individualized based on  duration of diabetes, age, comorbid conditions, and   other considerations. . Currently, no consensus exists regarding use of hemoglobin A1c for diagnosis of diabetes for children. SABRA Amy - Valid encounter within last 6 months    Recent Outpatient Visits           1 week ago Type 2 diabetes mellitus with complication [facility])   [place] [facility] Medicine Duanne Butler DASEN, MD   9 months ago Type 2 diabetes mellitus with complication [facility])   [place] [facility] Pickard, Butler DASEN, MD   1 year ago Benign essential HTN   [place] [facility] Family Medicine Duanne Butler DASEN, MD   2 years ago Screening for malignant neoplasm of cervix   [place] [facility] Family Medicine Kayla Jeoffrey RAMAN, FNP   2 years ago Type 2 diabetes mellitus with complication [facility])   [place] [facility] Family Medicine Pickard, Butler DASEN, MD  JARDIANCE  25 MG TABS tablet [Pharmacy Med Name: Jardiance  25 MG Oral Tablet] 90 tablet 0    Sig: TAKE 1 TABLET BY MOUTH ONCE DAILY BEFORE BREAKFAST     Endocrinology:  Diabetes - SGLT2 Inhibitors Passed - 10/27/2023  5:54 PM      Passed - Cr in normal range and within 360 days    Creat  Date Value Ref Range Status  10/20/2023 0.69 0.50 - 1.03 mg/dL Final   Creatinine, Urine  Date Value Ref Range Status  01/13/2023 67 20 - 275 mg/dL Final         Passed - HBA1C is between 0 and 7.9 and within 180 days    Hgb A1C (fingerstick)  Date Value Ref Range Status  04/15/2015 7.2 (H) <5.7 % Final    Comment:                                                                           According to the ADA Clinical Practice Recommendations for 2011, when HbA1c is used as a screening test:     >=6.5%   Diagnostic of Diabetes Mellitus            (if abnormal result is confirmed)   5.7-6.4%   Increased risk of developing Diabetes Mellitus   References:Diagnosis and Classification of Diabetes Mellitus,Diabetes Care,2011,34(Suppl 1):S62-S69 and Standards  of Medical Care in         Diabetes - 2011,Diabetes Care,2011,34 (Suppl 1):S11-S61.      Hgb A1c MFr Bld  Date Value Ref Range Status  10/20/2023 7.0 (H) <5.7 % Final    Comment:    For someone without known diabetes, a hemoglobin A1c value of 6.5% or greater indicates that they may have  diabetes and this should be confirmed with a follow-up  test. . For someone with known diabetes, a value <7% indicates  that their diabetes is well controlled and a value  greater than or equal to 7% indicates suboptimal  control. A1c targets should be individualized based on  duration of diabetes, age, comorbid conditions, and  other considerations. . Currently, no consensus exists regarding use of hemoglobin A1c for diagnosis of diabetes for children. .          Passed - eGFR in normal range and within 360 days    GFR, Est African American  Date Value Ref Range Status  05/22/2020 102 > OR = 60 mL/min/1.75m2 Final   GFR, Est Non African American  Date Value Ref Range Status  05/22/2020 88 > OR = 60 mL/min/1.29m2 Final   eGFR  Date Value Ref Range Status  10/20/2023 102 > OR = 60 mL/min/1.49m2 Final         Passed - Valid encounter within last 6 months    Recent Outpatient Visits           1 week ago Type 2 diabetes mellitus with complication [facility])   [place] [facility] Duanne Butler DASEN, MD   9 months ago Type 2 diabetes mellitus with complication [facility])   [place] [facility] Family Medicine Pickard, Butler DASEN, MD   1 year ago Benign essential HTN   [place] [facility] Medicine [place],  Butler DASEN, MD   2 years ago Screening for malignant neoplasm of cervix   [place] [facility] Family Medicine Kayla Jeoffrey RAMAN, FNP   2 years ago Type 2 diabetes mellitus with complication [facility])   [place] [facility] Medicine Pickard, Butler DASEN, MD              Signed Prescriptions Disp Refills   metFORMIN  (GLUCOPHAGE ) 1000 MG tablet 180  tablet 1    Sig: TAKE 1 TABLET BY MOUTH TWICE DAILY WITH A MEAL     Endocrinology:  Diabetes - Biguanides Failed - 10/27/2023  5:54 PM      Failed - B12 Level in normal range and within 720 days    Vitamin B-12  Date Value Ref Range Status  07/16/2019 594 200 - 1,100 pg/mL Final         Failed - CBC within normal limits and completed in the last 12 months    WBC  Date Value Ref Range Status  10/20/2023 4.7 3.8 - 10.8 Thousand/uL Final   RBC  Date Value Ref Range Status  10/20/2023 5.09 3.80 - 5.10 Million/uL Final   Hemoglobin  Date Value Ref Range Status  10/20/2023 15.4 11.7 - 15.5 g/dL Final   HCT  Date Value Ref Range Status  10/20/2023 46.2 (H) 35.0 - 45.0 % Final   MCHC  Date Value Ref Range Status  10/20/2023 33.3 32.0 - 36.0 g/dL Final    Comment:    For adults, a slight decrease in the calculated MCHC value (in the range of 30 to 32 g/dL) is most likely not clinically significant; however, it should be interpreted with caution in correlation with other red cell parameters and the patient's clinical condition.    [facility]  Date Value Ref Range Status  10/20/2023 30.3 27.0 - 33.0 pg Final   MCV  Date Value Ref Range Status  10/20/2023 90.8 80.0 - 100.0 fL Final   No results found for: PLTCOUNTKUC, LABPLAT, POCPLA RDW  Date Value Ref Range Status  10/20/2023 13.2 11.0 - 15.0 % Final         Passed - Cr in normal range and within 360 days    Creat  Date Value Ref Range Status  10/20/2023 0.69 0.50 - 1.03 mg/dL Final   Creatinine, Urine  Date Value Ref Range Status  01/13/2023 67 20 - 275 mg/dL Final         Passed - HBA1C is between 0 and 7.9 and within 180 days    Hgb A1C (fingerstick)  Date Value Ref Range Status  04/15/2015 7.2 (H) <5.7 % Final    Comment:                                                                           According to the ADA Clinical Practice Recommendations for 2011, when HbA1c is used as a screening test:      >=6.5%   Diagnostic of Diabetes Mellitus            (if abnormal result is confirmed)   5.7-6.4%   Increased risk of developing Diabetes Mellitus   References:Diagnosis and Classification of Diabetes Mellitus,Diabetes Care,2011,34(Suppl 1):S62-S69 and Standards of Medical  Care in         Diabetes - 2011,Diabetes Care,2011,34 (Suppl 1):S11-S61.      Hgb A1c MFr Bld  Date Value Ref Range Status  10/20/2023 7.0 (H) <5.7 % Final    Comment:    For someone without known diabetes, a hemoglobin A1c value of 6.5% or greater indicates that they may have  diabetes and this should be confirmed with a follow-up  test. . For someone with known diabetes, a value <7% indicates  that their diabetes is well controlled and a value  greater than or equal to 7% indicates suboptimal  control. A1c targets should be individualized based on  duration of diabetes, age, comorbid conditions, and  other considerations. . Currently, no consensus exists regarding use of hemoglobin A1c for diagnosis of diabetes for children. .          Passed - eGFR in normal range and within 360 days    GFR, Est African American  Date Value Ref Range Status  05/22/2020 102 > OR = 60 mL/min/1.82m2 Final   GFR, Est Non African American  Date Value Ref Range Status  05/22/2020 88 > OR = 60 mL/min/1.37m2 Final   eGFR  Date Value Ref Range Status  10/20/2023 102 > OR = 60 mL/min/1.31m2 Final         Passed - Valid encounter within last 6 months    Recent Outpatient Visits           1 week ago Type 2 diabetes mellitus with complication [facility])   [place] [facility] Medicine Duanne Butler DASEN, MD   9 months ago Type 2 diabetes mellitus with complication [facility])   [facility] Family Medicine Pickard, Butler DASEN, MD   1 year ago Benign essential HTN   [place] [facility] Family Medicine Duanne Butler DASEN, MD   2 years ago Screening for malignant neoplasm of cervix   [place] [facility]  Family Medicine Kayla Jeoffrey RAMAN, FNP   2 years ago Type 2 diabetes mellitus with complication [facility])   [place] [facility] Family Medicine Pickard, Butler DASEN, MD               bs

## 2023-12-04 ENCOUNTER — Encounter: Payer: Self-pay | Admitting: Radiology

## 2023-12-21 ENCOUNTER — Other Ambulatory Visit: Payer: Self-pay | Admitting: Family Medicine

## 2023-12-21 DIAGNOSIS — E118 Type 2 diabetes mellitus with unspecified complications: Secondary | ICD-10-CM

## 2023-12-22 ENCOUNTER — Other Ambulatory Visit: Payer: Self-pay | Admitting: Family Medicine

## 2023-12-22 DIAGNOSIS — E118 Type 2 diabetes mellitus with unspecified complications: Secondary | ICD-10-CM

## 2023-12-25 NOTE — Telephone Encounter (Signed)
 Requested medication (s) are due for refill today: Pharmacy comment: Please clarify the directions  for this prescription.   Requested medication (s) are on the active medication list: yes  Last refill:  12/22/23  Future visit scheduled: yes  Notes to clinic:  Pharmacy comment: Please clarify the directions  for this prescription.      Requested Prescriptions  Pending Prescriptions Disp Refills   RELION GLUCOSE TEST STRIPS test strip [Pharmacy Med Name: RELION PREMIER TEST STRIP] 100 each 0    Sig: USE AS DIRECTED     Endocrinology: Diabetes - Testing Supplies Passed - 12/25/2023 10:38 AM      Passed - Valid encounter within last 12 months    Recent Outpatient Visits           2 months ago Type 2 diabetes mellitus with complication [facility])   [place] [facility] Duanne Butler DASEN, MD   11 months ago Type 2 diabetes mellitus with complication [facility])   [place] [facility] Family Medicine Pickard, Butler DASEN, MD   1 year ago Benign essential HTN   [place] [facility] Family Medicine Duanne Butler DASEN, MD   2 years ago Screening for malignant neoplasm of cervix   [place] [facility] Family Medicine Kayla Jeoffrey RAMAN, FNP   2 years ago Type 2 diabetes mellitus with complication [facility])   [place] [facility] Family Medicine Pickard, Butler DASEN, MD

## 2024-01-08 ENCOUNTER — Telehealth: Payer: Self-pay | Admitting: Family Medicine

## 2024-01-08 ENCOUNTER — Other Ambulatory Visit: Payer: Self-pay

## 2024-01-08 DIAGNOSIS — E118 Type 2 diabetes mellitus with unspecified complications: Secondary | ICD-10-CM

## 2024-01-08 MED ORDER — RELION BLOOD GLUCOSE TEST VI STRP: ORAL_STRIP | 3 refills | Status: AC

## 2024-01-08 NOTE — Telephone Encounter (Unsigned)
 Copied from CRM #[id number]. Topic: Clinical - Medication Question >> Jan 08, 2024 12:30 PM Victoria B wrote: Reason for CRM: woodie from [facility] states needs more specific directions on relion test strips, before insurance will pay for it It just says take as needed

## 2024-02-19 ENCOUNTER — Ambulatory Visit

## 2024-02-19 ENCOUNTER — Ambulatory Visit: Payer: Self-pay | Admitting: Podiatry

## 2024-02-19 DIAGNOSIS — M205X1 Other deformities of toe(s) (acquired), right foot: Secondary | ICD-10-CM

## 2024-02-19 DIAGNOSIS — L03031 Cellulitis of right toe: Secondary | ICD-10-CM | POA: Diagnosis not present

## 2024-02-19 DIAGNOSIS — M79671 Pain in right foot: Secondary | ICD-10-CM

## 2024-02-19 DIAGNOSIS — L97511 Non-pressure chronic ulcer of other part of right foot limited to breakdown of skin: Secondary | ICD-10-CM | POA: Diagnosis not present

## 2024-02-19 MED ORDER — CEPHALEXIN 500 MG PO CAPS: 500.0000 mg | ORAL_CAPSULE | Freq: Three times a day (TID) | ORAL | 0 refills | Status: AC

## 2024-02-19 MED ORDER — MUPIROCIN 2 % EX OINT: 1.0000 | TOPICAL_OINTMENT | Freq: Two times a day (BID) | CUTANEOUS | 2 refills | Status: AC

## 2024-02-19 NOTE — Patient Instructions (Signed)
 Monitor for any signs/symptoms of infection. Call the office immediately if any occur or go directly to the emergency room. Call with any questions/concerns.

## 2024-02-23 NOTE — Progress Notes (Signed)
 Subjective:   Patient ID: Mckenzie Tyler, female   DOB: 54 y.o.   MRN: [id number]   HPI Chief Complaint  Patient presents with   Callouses    NIDDM patient with an A1c of 7.0 presents today c.o of calloses and corn in between her toes.   54 year old female presents the office the above concerns.  She states that she has lesions, pain on her right foot particular on the 4th and 5th toes.  She has not seen any drainage or pus.  She did try to use medicated pads.  No injuries.   Review of Systems  All other systems reviewed and are negative.  Past Medical History:  Diagnosis Date   Allergy    Depression    Diabetes mellitus type 2 with complications (HCC)    Diabetes mellitus without complication (HCC)    [employer] 01/12/2020   Hyperlipidemia 02/13/2013   Smoker     Past Surgical History:  Procedure Laterality Date   EYE SURGERY N/A    [employer] 01/12/2020   RETINOPATHY SURGERY  11/16/2017   due to diabetes    TONSILLECTOMY AND ADENOIDECTOMY     age 61    Current Medications[1]  Allergies[2]        Objective:  Physical Exam  General: AAO x3, NAD  Dermatological: Superficial skin breakdown noted on the right fifth toe lateral aspect.  There is localized edema and erythema present to the fifth toe.  Preulcerative area noted on the fourth toe as pictured below.  There is no probing, undermining or tunneling.       Vascular: Dorsalis Pedis artery and Posterior Tibial artery pedal pulses are 2/4 bilateral with immedate capillary fill time. There is no pain with calf compression, swelling, warmth, erythema.   Neruologic: Grossly intact via light touch bilateral.   Musculoskeletal: Tenderness the skin lesions.  Adductovarus is present.  Rubbing of the 4th and 5th toes.       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     Plan:  -Treatment options discussed including all  alternatives, risks, and complications -Etiology of symptoms were discussed -X-rays were obtained and reviewed with the patient.  3 views right foot were obtained.  No evidence of acute fracture.  There is no cortical destruction suggest osteomyelitis.  - Sharply debrided lesions or any complications or bleeding #312 with scalpel.  Recommend antibiotic ointment dressing changes daily. -Given localized erythema prescribe cephalexin  -Surgical shoe dispensed for offloading. - Monitor for any clinical signs or symptoms of infection and directed to call the office immediately should any occur or go to the ER.  Return for toe ulcer in 2-3 weeks.  Donnice JONELLE Fees DPM         [1]  Current Outpatient Medications:    aspirin  EC 81 MG tablet, Take 1 tablet (81 mg total) by mouth daily. Swallow whole., Disp: 90 tablet, Rfl: 3   calcium  carbonate (OS-CAL) 600 MG tablet, Take 600 mg by mouth daily., Disp: , Rfl:    cephALEXin  (KEFLEX ) 500 MG capsule, Take 1 capsule (500 mg total) by mouth 3 (three) times daily., Disp: 21 capsule, Rfl: 0   empagliflozin  (JARDIANCE ) 25 MG TABS tablet, TAKE 1 TABLET BY MOUTH ONCE DAILY BEFORE BREAKFAST, Disp: 90 tablet, Rfl: 1   glucose blood (RELION GLUCOSE TEST STRIPS) test strip, Use to check blood sugars up to four times per day., Disp: 100 each, Rfl: 3  HUMALOG  KWIKPEN 100 UNIT/ML KwikPen, INJECT 10 TO 12 UNITS SUBCUTANEOUSLY THREE TIMES DAILY, Disp: 15 mL, Rfl: 3   icosapent  Ethyl (VASCEPA ) 1 g capsule, Take 2 capsules (2 g total) by mouth 2 (two) times daily., Disp: 360 capsule, Rfl: 3   Magnesium 400 MG TABS, Take by mouth., Disp: , Rfl:    metFORMIN  (GLUCOPHAGE ) 1000 MG tablet, TAKE 1 TABLET BY MOUTH TWICE DAILY WITH A MEAL, Disp: 180 tablet, Rfl: 1   Multiple Vitamin (MULTIVITAMIN) tablet, Take 1 tablet by mouth daily., Disp: , Rfl:    mupirocin  ointment (BACTROBAN ) 2 %, Apply 1 Application topically 2 (two) times daily., Disp: 30 g, Rfl: 2   rosuvastatin   (CRESTOR ) 20 MG tablet, Take 1 tablet (20 mg total) by mouth daily., Disp: 90 tablet, Rfl: 3   vitamin C (ASCORBIC ACID) 500 MG tablet, Take 500 mg by mouth daily., Disp: , Rfl:  [2]  Allergies Allergen Reactions   Codeine Itching

## 2024-03-15 ENCOUNTER — Ambulatory Visit: Admitting: Podiatry
